# Patient Record
Sex: Male | Born: 1955 | Race: Black or African American | Hispanic: No | Marital: Married | Smoking: Never smoker
Health system: Southern US, Community
[De-identification: ages and names within clinical notes are randomized; demographics above are authoritative.]

## PROBLEM LIST (undated history)

## (undated) HISTORY — PX: APPENDECTOMY: SHX54

---

## 2003-07-22 ENCOUNTER — Inpatient Hospital Stay (HOSPITAL_COMMUNITY): Admission: EM | Admit: 2003-07-22 | Discharge: 2003-08-04 | Payer: Self-pay | Admitting: Emergency Medicine

## 2003-07-22 ENCOUNTER — Encounter: Admission: RE | Admit: 2003-07-22 | Discharge: 2003-08-04 | Payer: Self-pay

## 2003-07-22 ENCOUNTER — Ambulatory Visit: Admission: RE | Admit: 2003-07-22 | Discharge: 2003-07-22 | Payer: Self-pay | Admitting: Family Medicine

## 2005-02-09 IMAGING — CR DG ABDOMEN 2V
3 series · 3 of 3 positions shown · non-contrast
Comparison: 07/28/03.

CLINICAL DATA: ruptured appendicitis; status post percutaneous drainage of pelvic abscesses; increased dilated bowel loops seen on recent CT 
 TWO VIEW ABDOMEN

[view not recorded (1 of 3)]
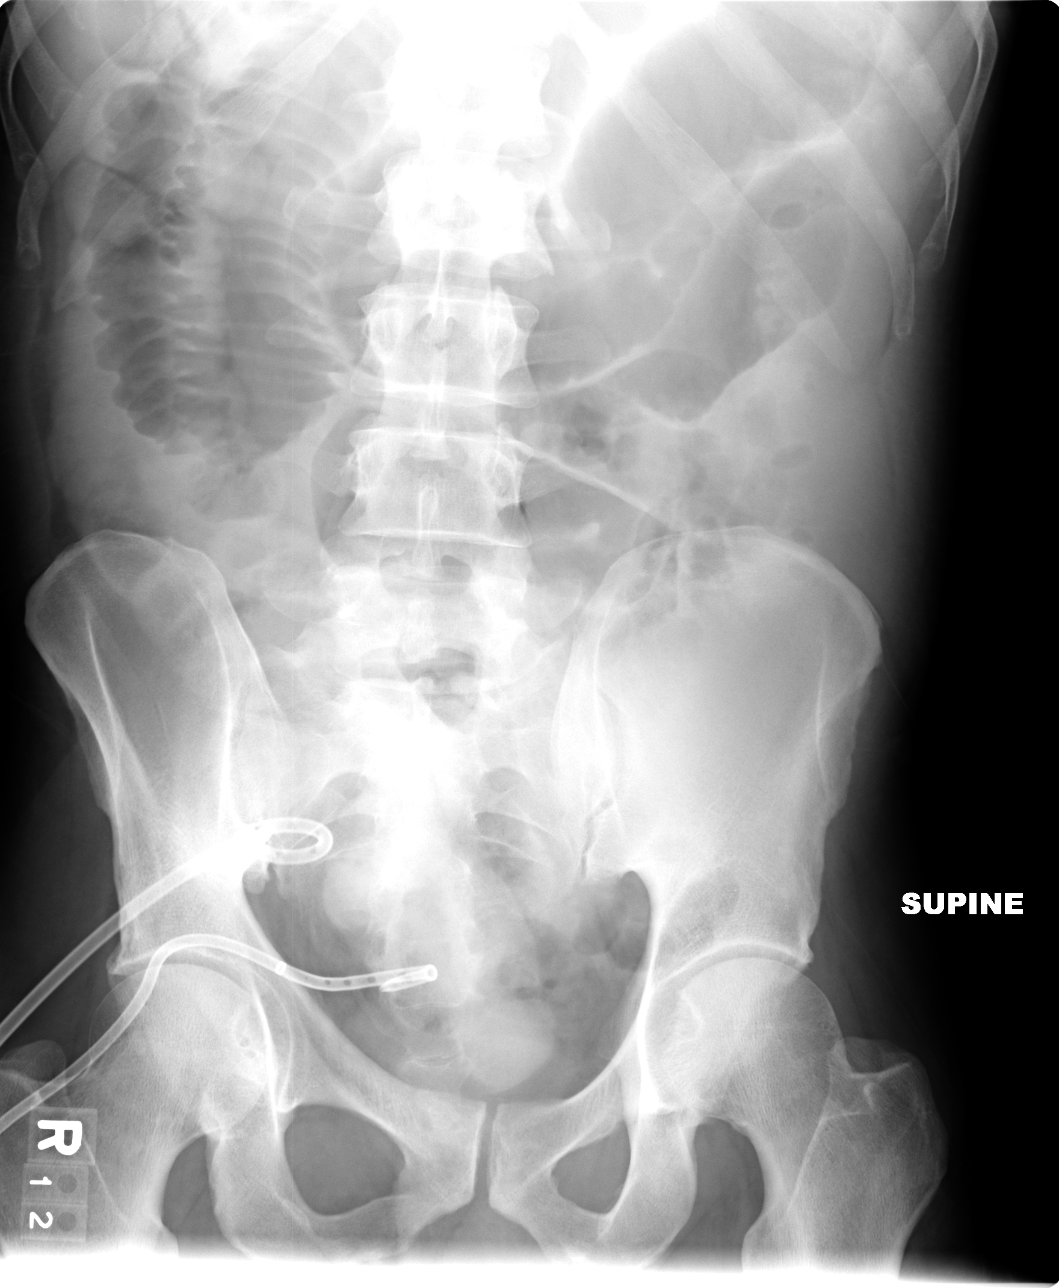

[view not recorded (2 of 3)]
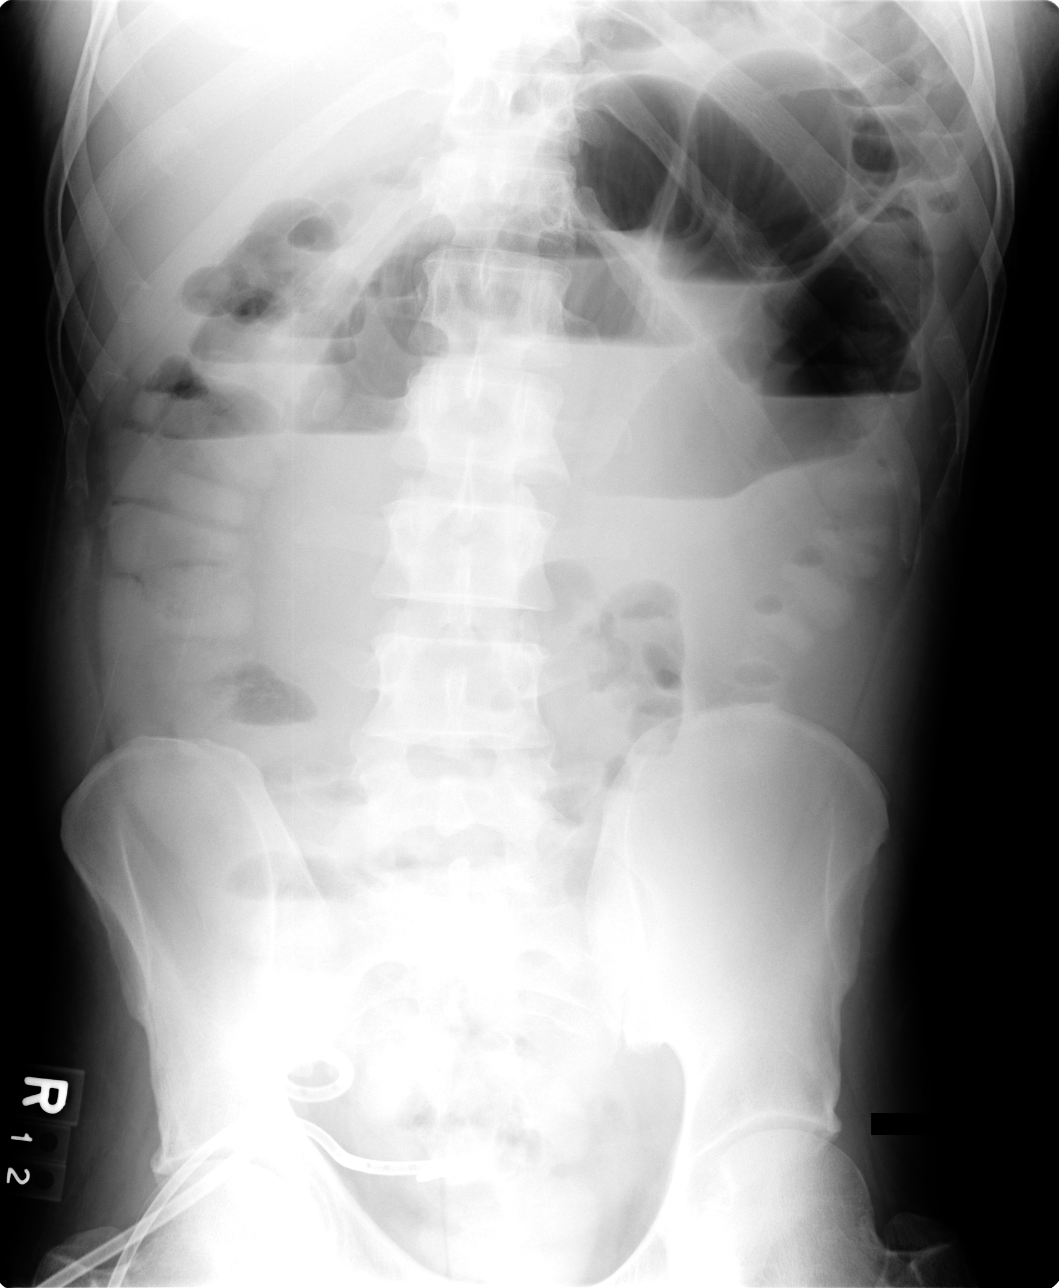

[view not recorded (3 of 3)]
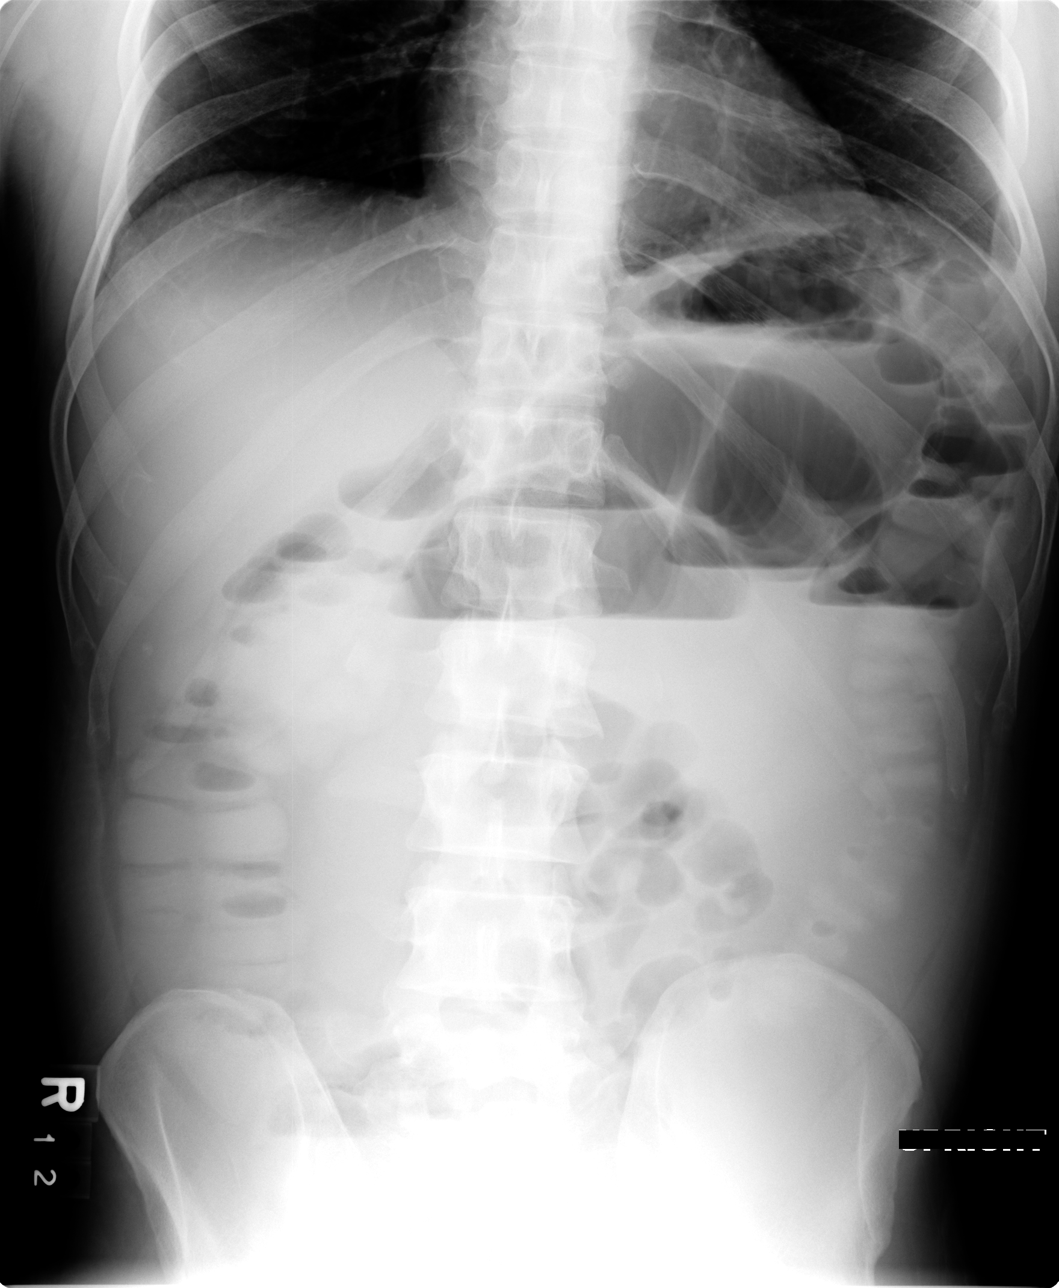

[3 of 3 positions shown; findings below may reference images not displayed]

Two percutaneous drainage catheters are seen in the right pelvis.  Dilated small bowel loops are seen within the upper abdomen which are increased.  There is scattered colonic gas and contrast from recent CT seen throughout the colon.  This may be due to a partial small bowel obstruction or ileus.  Multiple small bowel air-fluid levels are seen as well as some colonic air-fluid levels on the erect view.  There is no evidence of free intraperitoneal air.  
 IMPRESSION
 Mild increase in dilated small bowel loops since prior study, although contrast and gas is seen scattered throughout the colon.  This is suspicious for a partial small bowel obstruction or possibly a small bowel ileus.

## 2006-04-18 ENCOUNTER — Ambulatory Visit (HOSPITAL_COMMUNITY): Admission: RE | Admit: 2006-04-18 | Discharge: 2006-04-18 | Payer: Self-pay | Admitting: Family Medicine

## 2007-10-26 IMAGING — CT CT CHEST W/ CM
2 of 3 series · 15 of 36 positions shown, 18 images · IV contrast (omnipaque)
Comparison: None available.

CLINICAL DATA: 50-year-old with pulmonary nodule.  
 CHEST CT WITH CONTRAST:
TECHNIQUE: Multidetector CT imaging of the chest was performed following the standard protocol during bolus administration of intravenous contrast.
 Contrast:  80 cc Omnipaque 300.

[Series 2: chest routine 5.0 b40f · axial · 0.60mm/px · z∈[-136,+150]mm · 12 of 67 slices shown, 15 images]
[im 5/67  mediastinal]
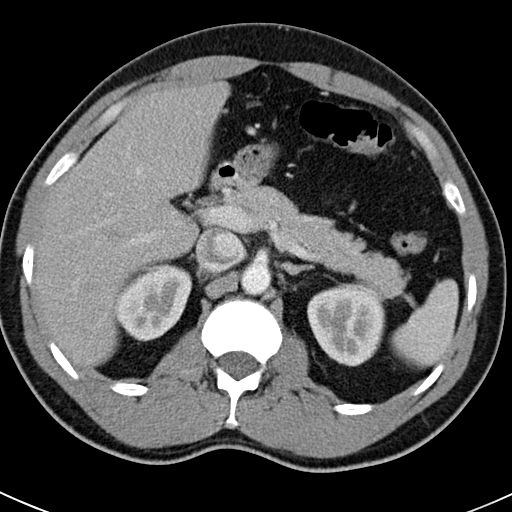
[im 5/67  lung]
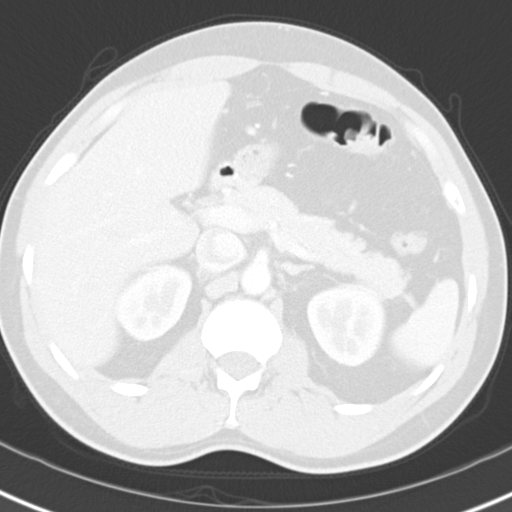
[im 10/67  lung]
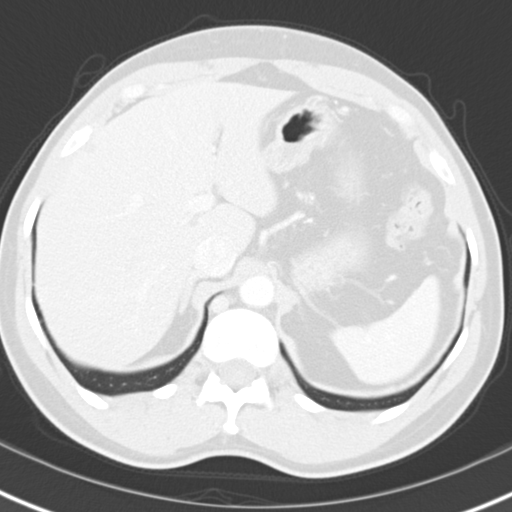
[im 15/67  lung]
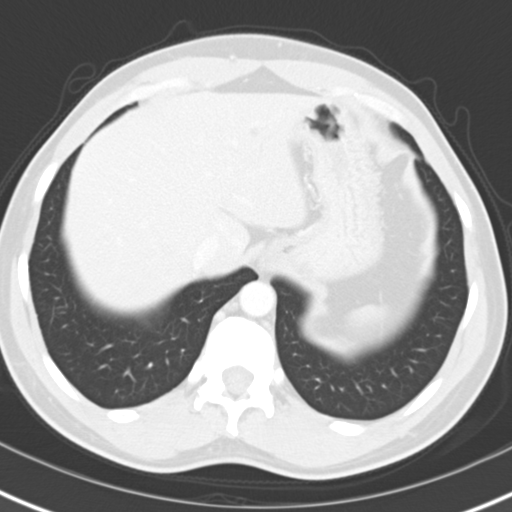
[im 20/67  lung]
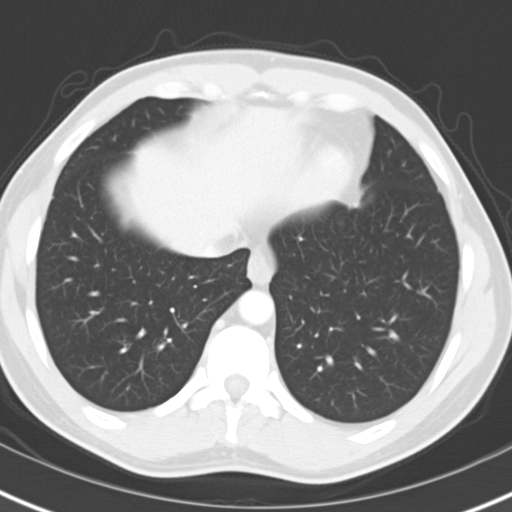
[im 25/67  mediastinal]
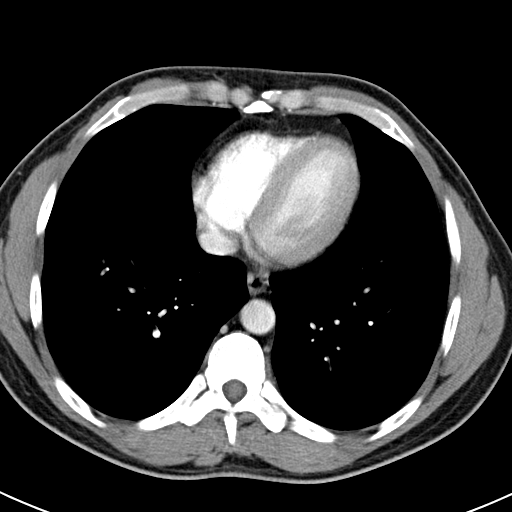
[im 25/67  lung]
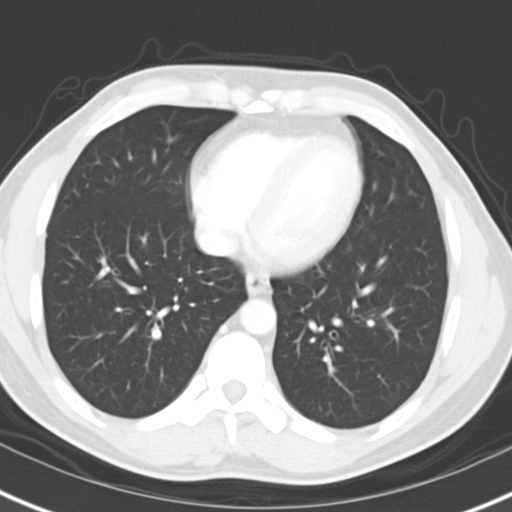
[im 30/67  lung]
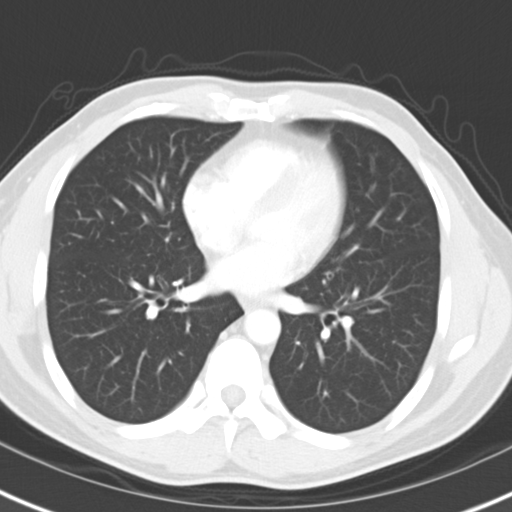
[im 37/67  lung]
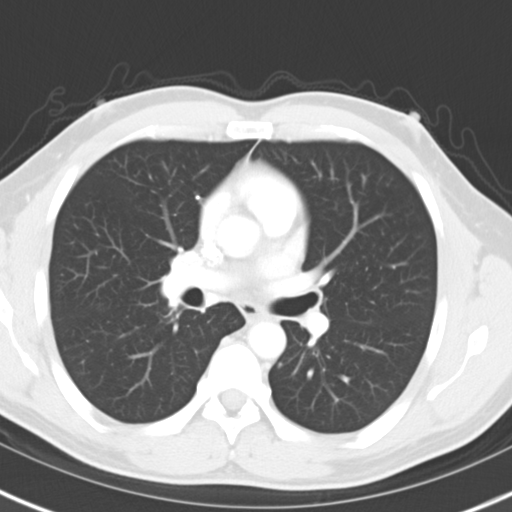
[im 42/67  lung]
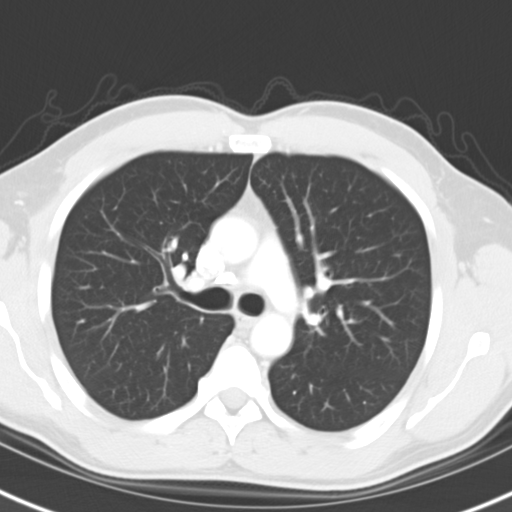
[im 47/67  mediastinal]
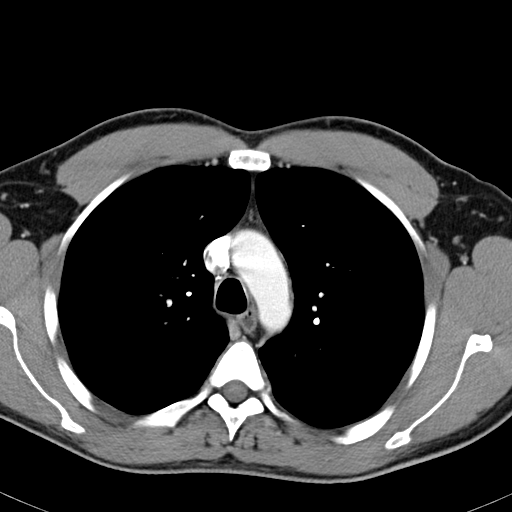
[im 47/67  lung]
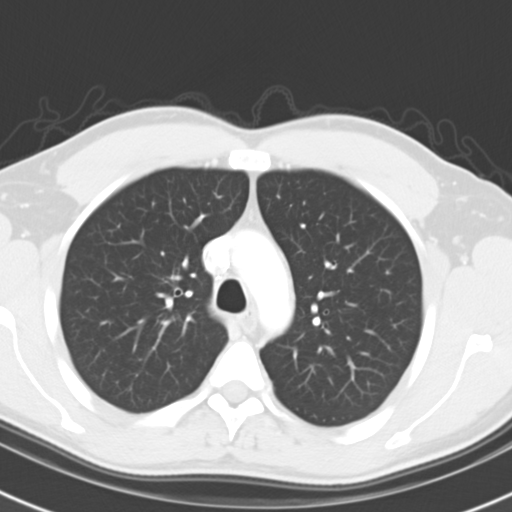
[im 52/67  lung]
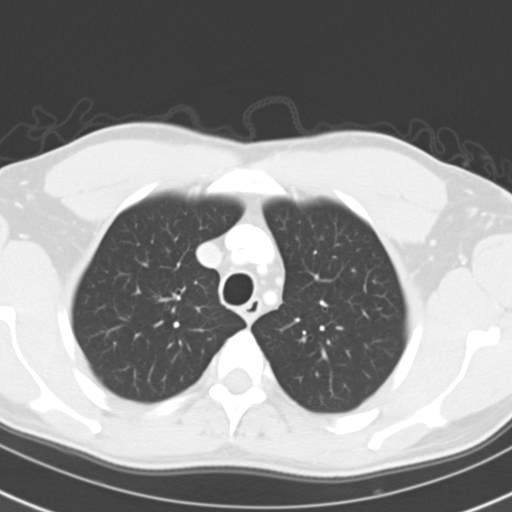
[im 57/67  lung]
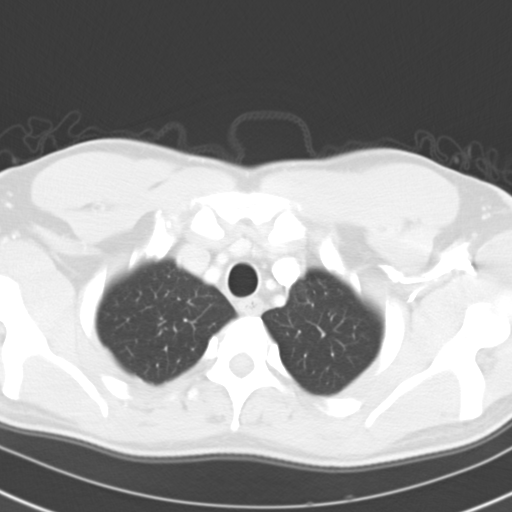
[im 62/67  lung]
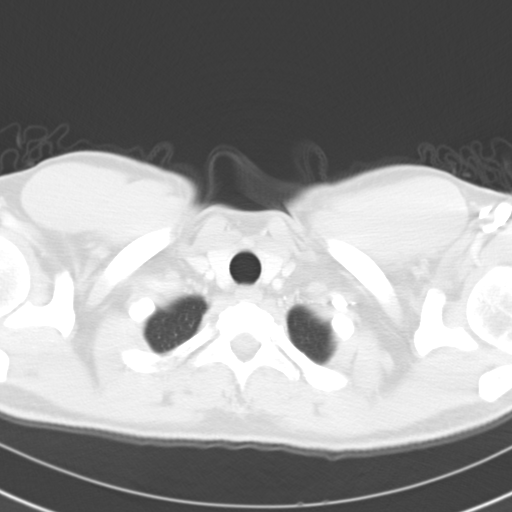

[Series 602: <mpr range> · coronal · 0.65mm/px · 3 of 99 slices shown]
[im 20/99  lung]
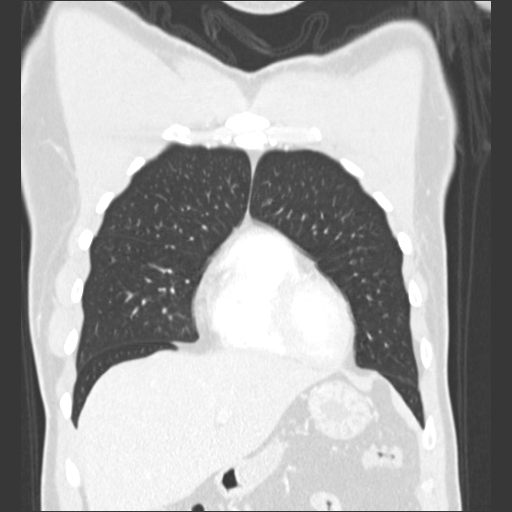
[im 40/99  lung]
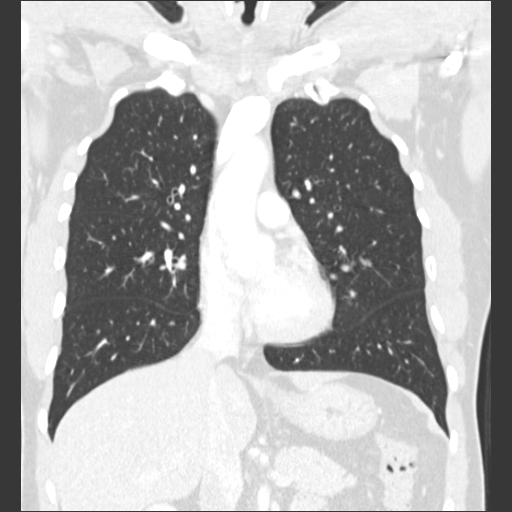
[im 59/99  lung]
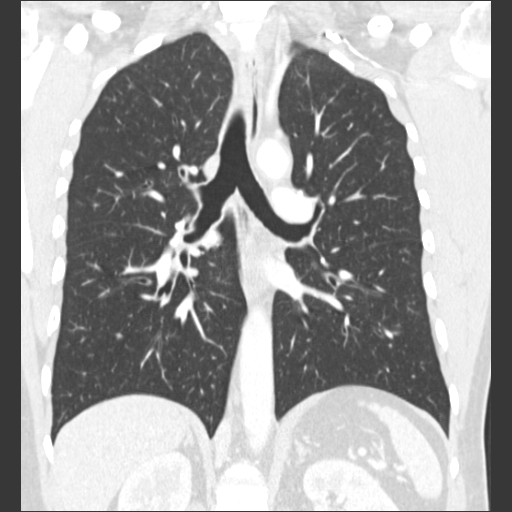

[15 of 36 positions shown; findings below may reference images not displayed]

FINDINGS: The chest wall, soft tissues and bony structures are unremarkable.  No supraclavicular or axillary adenopathy.  No significant bony findings.  No spinal canal compromise.  
 Heart is normal in size.  No pericardial effusion.  No mediastinal or hilar adenopathy.  There are calcified mediastinal and hilar lymph nodes likely secondary to remote granulomatous disease.  The aorta is normal in caliber.  No dissection.  Pulmonary arteries are grossly normal.  Esophagus is unremarkable.  
 Examination of the lung parenchyma demonstrates no worrisome pulmonary nodules or masses.  There are two small, calcified granulomata.  One is in the right middle lobe on image number 31 and the other is in the left lower lobe on image number 48.  No acute pulmonary findings.  No pleural effusions or pleural thickening.  
 The upper abdomen demonstrates small low attenuation liver lesions.  These are too small to accurate characterize but are most likely benign hepatic cysts.  No upper abdominal adenopathy or masses.
IMPRESSION: 1.  Two small, calcified granulomata in the lung along with calcified mediastinal and hilar lymph nodes likely due to remote granulomatous disease.  
 2.  No acute pulmonary findings and no worrisome pulmonary masses or nodules.  
 3.  No mediastinal or hilar adenopathy.  
 4.  Normal appearance of the thoracic aorta.  
 5.  Small low attenuation lesion in the liver most likely benign hepatic cyst.

## 2010-04-18 ENCOUNTER — Encounter: Payer: Self-pay | Admitting: Family Medicine

## 2021-04-16 ENCOUNTER — Other Ambulatory Visit: Payer: Self-pay | Admitting: Urology

## 2021-04-27 ENCOUNTER — Ambulatory Visit: Payer: Managed Care, Other (non HMO)

## 2021-05-28 ENCOUNTER — Other Ambulatory Visit (HOSPITAL_COMMUNITY): Payer: Self-pay

## 2021-09-06 ENCOUNTER — Ambulatory Visit: Payer: Medicare Other | Attending: Urology | Admitting: Physical Therapy

## 2021-09-06 ENCOUNTER — Encounter: Payer: Self-pay | Admitting: Physical Therapy

## 2021-09-06 DIAGNOSIS — M25652 Stiffness of left hip, not elsewhere classified: Secondary | ICD-10-CM | POA: Diagnosis not present

## 2021-09-06 DIAGNOSIS — R293 Abnormal posture: Secondary | ICD-10-CM | POA: Insufficient documentation

## 2021-09-06 DIAGNOSIS — M25651 Stiffness of right hip, not elsewhere classified: Secondary | ICD-10-CM | POA: Diagnosis not present

## 2021-09-06 NOTE — Therapy (Signed)
OUTPATIENT PHYSICAL THERAPY MALE PELVIC EVALUATION   Patient Name: Ivan Lopez MRN: [id number] DOB:[birth year], 66 y.o., male Today's Date: 09/06/2021   PT End of Session - 09/06/21 1128     Visit Number 1    Date for PT Re-Evaluation 11/29/21    Authorization Type [employer]    PT Start Time 1110    PT Stop Time 1200    PT Time Calculation (min) 50 min    Activity Tolerance Patient tolerated treatment well    Behavior During Therapy [facility] for tasks assessed/performed             History reviewed. No pertinent past medical history. History reviewed. No pertinent surgical history. There are no problems to display for this patient.   PCP: None per patient  REFERRING PROVIDER: Raynelle Bring, MD  REFERRING DIAG: [place] (ICD-10-CM) - Malignant neoplasm of prostate  THERAPY DIAG:  Abnormal posture  Stiffness of left hip, not elsewhere classified  Stiffness of right hip, not elsewhere classified  Rationale for Evaluation and Treatment Rehabilitation  ONSET DATE: 2016  SUBJECTIVE:                                                                                                                                                                                           SUBJECTIVE STATEMENT: Pt having surgery 10/04/21.  Wife is present throughout evaluation. Fluid intake: coffee 2-3 cups, water 1 glass Patient confirms identification and approves PT to assess pelvic floor and treatment deferred today   PAIN:  Are you having pain? No  PRECAUTIONS: None  WEIGHT BEARING RESTRICTIONS No  FALLS:  Has patient fallen in last 6 months? No  LIVING ENVIRONMENT: Lives with: lives with their family and lives with their spouse Lives in: House/apartment   OCCUPATION: no  PLOF: Independent  PATIENT GOALS knowledge how to strengthen after surgery  PERTINENT HISTORY:  Prostate cancer, appendix, testicle removed Sexual abuse: No  BOWEL MOVEMENT Pain with bowel movement: No Type  of bowel movement:Frequency every other day and Strain No Fully empty rectum: Yes:   Leakage: No Pads: No Fiber supplement: No  URINATION Pain with urination: No Fully empty bladder: Yes:   Stream: Strong and Weak mostly weak Urgency: No Frequency: every 15-30 min Leakage:  no Pads: No  INTERCOURSE Pain with intercourse:  No  Unable to get and maintain erection     OBJECTIVE:     PATIENT SURVEYS:  PFIQ (not completed today to re-assess post surgery)  COGNITION:  Overall cognitive status: Within functional limits for tasks assessed      MUSCLE LENGTH: Hamstrings: Right 90% ; Left 90%  Marcello Moores test:  Right 70% ; Left 70%  Quads tight Lt>Rt   FUNCTIONAL TESTS:  SLS normal  GAIT:  Comments: WFL  POSTURE:  Anterior pelvic tilt, decreased thoracic kyphosis  LUMBARAROM/PROM  A/PROM A/PROM  eval  Flexion 50%  Extension   Right lateral flexion   Left lateral flexion   Right rotation   Left rotation    (Blank rows = not tested)  LOWER EXTREMITY AROM/PROM:  A/PROM Right eval Left eval  Hip flexion 75% 60^  Hip extension    Hip abduction    Hip adduction    Hip internal rotation 50% 50%  Hip external rotation 30% 60%  Knee flexion    Knee extension    Ankle dorsiflexion    Ankle plantarflexion    Ankle inversion    Ankle eversion     (Blank rows = not tested)  LOWER EXTREMITY MMT: Bil hp 5/5 abduction and adduction MMT Right eval Left eval  Hip flexion    Hip extension (knee bent) 4+/5 4+/5  Hip abduction    Hip adduction    Hip internal rotation    Hip external rotation    Knee flexion    Knee extension    Ankle dorsiflexion    Ankle plantarflexion    Ankle inversion    Ankle eversion      PALPATION: GENERAL lumbar and low thoracic paraspinals tight              External Perineal Exam palpated contract and relax and can hold 20 sec, slightly less lift of pelvic floor on the left side              Internal Pelvic Floor  deferred  TONE: Normal based on external palpation  TODAY'S TREATMENT  EVAL and initial HEP with pre-prostatectomy instructions   PATIENT EDUCATION:  Education details: pre-prostatectomy instructions; Access Code: [email or web address] Person educated: Patient and Spouse Education method: Explanation, Demonstration, Tactile cues, Verbal cues, and Handouts Education comprehension: verbalized understanding and returned demonstration   HOME EXERCISE PROGRAM: Access Code: [email or web address] URL: [email or web address] Date: 09/06/2021 Prepared by: Jari Favre  Exercises - Standing Quadriceps Stretch  - 1 x daily - 7 x weekly - 1 sets - 3 reps - 30 sec hold - Standing Hip Flexor Stretch  - 3 x daily - 7 x weekly - 1 sets - 2 reps - 30 sec hold - Supine Double Knee to Chest  - 1 x daily - 7 x weekly - 3 sets - 10 reps - Supine 90/90 Shoulder Flexion with Abdominal Bracing  - 1 x daily - 7 x weekly - 3 sets - 10 reps  ASSESSMENT:  CLINICAL IMPRESSION: Patient is a 66 y.o. male who was seen today for physical therapy evaluation and treatment for pre prostatectomy surgery.    OBJECTIVE IMPAIRMENTS decreased coordination, decreased ROM, decreased strength, hypomobility, increased muscle spasms, impaired flexibility, and postural dysfunction.   ACTIVITY LIMITATIONS toileting (will need to re-assess after surgery)  PARTICIPATION LIMITATIONS: community activity(will need to re-assess after surgery)  PERSONAL FACTORS 1 comorbidity: prostate cancer and upcoming surgery  are also affecting patient's functional outcome.   REHAB POTENTIAL: Excellent  CLINICAL DECISION MAKING: Evolving/moderate complexity  EVALUATION COMPLEXITY: Low   GOALS: Goals reviewed with patient? Yes  SHORT TERM GOALS: Target date: 10/04/2021   Ind with initial HEP Baseline: Goal status: INITIAL  2.  Understanding of post prostatectomy protocol and safety precautions Baseline:  Goal status:  INITIAL   LONG TERM GOALS: Target date: 11/29/2021  Re-assess goals post prostatectomy Baseline:  Goal status: INITIAL   PLAN: PT FREQUENCY: 1x/week  PT DURATION: 12 weeks  PLANNED INTERVENTIONS: Therapeutic exercises, Therapeutic activity, Neuromuscular re-education, Balance training, Gait training, Patient/Family education, Joint mobilization, Dry Needling, Electrical stimulation, Spinal mobilization, Cryotherapy, Moist heat, Taping, Biofeedback, Manual therapy, and Re-evaluation  PLAN FOR NEXT SESSION: review education from previous visit; log roll, kegel in functional positions and with exhaling   Camillo Flaming Lavere Stork, PT 09/06/2021, 5:30 PM

## 2021-09-06 NOTE — Patient Instructions (Signed)
Pre-Prostectomy Physical Therapy Program  Water Intake: You should be drinking 8 glasses of water to decrease the irritants in the bladder and to promote healing. Reduce the number of bladder irritants 0-1 glasses per day. Incontinence Products: Briefs-underwear that have a padded area in the front Penile Clamp- clamp you place on the penis that is cushioned to stop the flow of urine Incontinence pads that is disposable Men's acticuf- pouch that goes around penis to manage light to moderate incontinence. Strengthening Program: Gluteal squeeze-lie on your back or sit in a chair. Squeeze your buttocks for 5 seconds 5 reps. every hour. Transverse Abdominus Contraction - lay on your back, push your belly button down to the mat, hands in front and curl upward to your shoulder blade 5x 3x per day. Pelvic floor contraction Piriformis stretch- supine and hip rotation Sitting hamstring stretch  After surgery  Catheter- after surgery a catheter will stay in for 7-10 days. After the catheter is removed you are able to resume the gluteal contraction, abdominal contractions and pelvic floor contractions Bed positioning- pillows to support right hip and knee in sidely. Use ice to perineum 10 min for 3 times per day Self massage the perineum Correct sitting posture When to call MD: 1. Blood in urine 2. Increased pain in right leg at night   [facility] [address], [place], [place] [address] Phone # [phone] Fax [phone]

## 2021-09-21 ENCOUNTER — Encounter: Payer: Self-pay | Admitting: Physical Therapy

## 2021-09-21 ENCOUNTER — Ambulatory Visit: Payer: Medicare Other | Admitting: Physical Therapy

## 2021-09-21 DIAGNOSIS — M25652 Stiffness of left hip, not elsewhere classified: Secondary | ICD-10-CM

## 2021-09-21 DIAGNOSIS — R293 Abnormal posture: Secondary | ICD-10-CM | POA: Diagnosis not present

## 2021-09-21 DIAGNOSIS — M25651 Stiffness of right hip, not elsewhere classified: Secondary | ICD-10-CM

## 2021-09-24 NOTE — Patient Instructions (Addendum)
DUE TO SPACE LIMITATIONS, ONLY TWO VISITORS  (aged 65 and older) ARE ALLOWED TO COME WITH YOU AND STAY IN THE WAITING ROOM DURING YOUR PRE OP AND PROCEDURE.   **NO VISITORS ARE ALLOWED IN THE SHORT STAY AREA OR RECOVERY ROOM!!**  IF YOU WILL BE ADMITTED INTO THE HOSPITAL YOU ARE ALLOWED ONLY FOUR SUPPORT PEOPLE DURING VISITATION HOURS (7 AM -8PM)   The support person(s) must pass our screening, and use Hand sanitizing gel. Visitors GUEST BADGE MUST BE WORN VISIBLY  One adult visitor may remain with you overnight and MUST be in the room by 8 P.M.   You are not required to quarantine at this time prior to your surgery. However, you must do this: Hand Hygiene often Do NOT share personal items Notify your provider if you are in close contact with someone who has COVID or you develop fever 100.4 or greater, new onset of sneezing, cough, sore throat, shortness of breath or body aches.       Your procedure is scheduled on:  Monday July 10,2023  Report to [facility] Main Entrance.  Report to admitting at: 09:00  AM  +++++Call this number if you have any questions or problems the morning of surgery [phone]   +Clear liquid diet the day before your surgery/procedure.  You will continue the clear liquid diet until  08:15  AM DAY OF SURGERY  Clear Liquid Diet Water Black Coffee (sugar ok, NO MILK/CREAM OR CREAMERS)  Tea (sugar ok, NO MILK/CREAM OR CREAMERS) regular and decaf                             Plain Jell-O (NO RED)                                           Fruit ices (not with fruit pulp, NO RED)                                     Popsicles (NO RED)                                                                  Juice: apple, WHITE grape, WHITE cranberry Sports drinks like Gatorade (NO RED) Clear broth(vegetable,chicken,beef)              If you have questions, please contact your surgeon's office.   FOLLOW BOWEL PREP AND ANY ADDITIONAL PRE OP INSTRUCTIONS YOU  RECEIVED FROM YOUR SURGEON'S OFFICE!!!     MIRALAX - Dissolve 17 grams in 4 ounces of water AND DRINK AT [place] - USE ONE FLEET [place]    Oral Hygiene is also important to reduce your risk of infection.        Remember - BRUSH YOUR TEETH THE MORNING OF SURGERY WITH YOUR REGULAR TOOTHPASTE  Do NOT smoke after Midnight the night before surgery.  Take ONLY these medicines the morning of surgery with A SIP OF WATER:  finasteride (Proscar)                    You may not have any metal on your body including  jewelry, and body piercing  Do not wear  lotions, powders, cologne, or deodorant   Men may shave face and neck.  Contacts, Hearing Aids, dentures or bridgework may not be worn into surgery.   You may bring a small overnight bag with you on the day of surgery, only pack items that are not valuable .[facility] IS NOT RESPONSIBLE   FOR VALUABLES THAT ARE LOST OR STOLEN.   DO NOT [place]. PHARMACY WILL DISPENSE MEDICATIONS LISTED ON YOUR MEDICATION LIST TO YOU DURING YOUR ADMISSION [place]!    Special Instructions: Bring a copy of your healthcare power of attorney and living will documents the day of surgery, if you wish to have them scanned into your [facility] Medical Records- EPIC  Please read over the following fact sheets you were given: IF YOU HAVE QUESTIONS ABOUT YOUR PRE-OP INSTRUCTIONS, PLEASE CALL [phone]  ([place])   [place] [facility] - Preparing for Surgery Before surgery, you can play an important role.  Because skin is not sterile, your skin needs to be as free of germs as possible.  You can reduce the number of germs on your skin by washing with CHG (chlorahexidine gluconate) soap before surgery.  CHG is an antiseptic cleaner which kills germs and bonds with the skin to continue killing germs even after washing. Please DO NOT use if you have an allergy to CHG or antibacterial  soaps.  If your skin becomes reddened/irritated stop using the CHG and inform your nurse when you arrive at Short Stay. Do not shave (including legs and underarms) for at least 48 hours prior to the first CHG shower.  You may shave your face/neck.  Please follow these instructions carefully:  1.  Shower with CHG Soap the night before surgery and the  morning of surgery.  2.  If you choose to wash your hair, wash your hair first as usual with your normal  shampoo.  3.  After you shampoo, rinse your hair and body thoroughly to remove the shampoo.                             4.  Use CHG as you would any other liquid soap.  You can apply chg directly to the skin and wash.  Gently with a scrungie or clean washcloth.  5.  Apply the CHG Soap to your body ONLY FROM THE NECK DOWN.   Do not use on face/ open                           Wound or open sores. Avoid contact with eyes, ears mouth and genitals (private parts).                       Wash face,  Genitals (private parts) with your normal soap.             6.  Wash thoroughly, paying special attention to the area where your  surgery  will be performed.  7.  Thoroughly rinse your body with warm water from the neck down.  8.  DO NOT shower/wash with your normal soap after using and rinsing off the CHG Soap.  9.  Pat yourself dry with a clean towel.            10.  Wear clean pajamas.            11.  Place clean sheets on your bed the night of your first shower and do not  sleep with pets.  ON THE DAY OF SURGERY : Do not apply any lotions/deodorants the morning of surgery.  Please wear clean clothes to the hospital/surgery center.    FAILURE TO FOLLOW THESE INSTRUCTIONS MAY RESULT IN THE CANCELLATION OF YOUR SURGERY  PATIENT SIGNATURE_________________________________  NURSE SIGNATURE__________________________________  ________________________________________________________________________

## 2021-09-24 NOTE — Progress Notes (Signed)
COVID Vaccine received:  '[]'$  No '[x]'$  Yes  Date of any COVID positive Test in last 90 days:  None  PCP - Dr. Nelda Bucks Cardiologist - none  Chest x-ray - none EKG -  09-24-21 at PST Stress Test - None ECHO - None Cardiac Cath - None  Pacemaker/ICD device last checked: NONE Spinal Cord Stimulator:'[x]'$  No '[]'$  Yes   Other Implants:   Bowel Prep - Clear liquids day prior, Miralax and Fleet Enema as per Dr. Lynne Logan instruction  History of Sleep Apnea? '[x]'$  No '[]'$  Yes  '[]'$  unknown Sleep Study Date:  none  Does the patient monitor blood sugar? '[x]'$  No '[]'$  Yes  '[x]'$  N/A     No History of diabetes  Blood Thinner Instructions:  none Aspirin Instructions: Last Dose:  Activity level:  Can go up a flight of stairs and perform activities of daily living without stopping and without symptoms of chest pain or shortness of breath.'[]'$  No '[x]'$  Yes  '[]'$  N/A  Able to do exercises without symptoms '[]'$  No '[x]'$  Yes  '[]'$  N/A  Patient able to complete ADLs without assistance '[]'$  No '[x]'$  Yes  '[]'$  N/A    Comments: PATIENT HAS SIGNED A BLOOD REFUSAL FORM ! Patient's wife is Jehovah's Witness and patient does not want any blood products.   Anesthesia review:    Patient denies shortness of breath, fever, cough and chest pain at PAT appointment  Patient verbalized understanding and agreement to the Pre-Surgical Instructions that were given to them at this PAT appointment. Patient was also educated of the need to review these PAT instructions again prior to his/her surgery.I reviewed the appropriate phone numbers to call if they have any and questions or concerns.

## 2021-09-27 ENCOUNTER — Encounter (HOSPITAL_COMMUNITY): Payer: Self-pay

## 2021-09-27 ENCOUNTER — Other Ambulatory Visit: Payer: Self-pay

## 2021-09-27 ENCOUNTER — Encounter (HOSPITAL_COMMUNITY)
Admission: RE | Admit: 2021-09-27 | Discharge: 2021-09-27 | Disposition: A | Discharge status: Home / Self Care | Payer: Medicare Other | Source: Ambulatory Visit | Attending: Urology | Admitting: Urology

## 2021-09-27 VITALS — BP 139/86 | HR 87 | Temp 98.7°F | Resp 12 | Ht 71.0 in | Wt 158.0 lb

## 2021-09-27 DIAGNOSIS — Z01818 Encounter for other preprocedural examination: Secondary | ICD-10-CM | POA: Diagnosis not present

## 2021-09-27 DIAGNOSIS — Z531 Procedure and treatment not carried out because of patient's decision for reasons of belief and group pressure: Secondary | ICD-10-CM | POA: Diagnosis not present

## 2021-09-27 LAB — BASIC METABOLIC PANEL
Anion gap: 7 (ref 5–15)
BUN: 21 mg/dL (ref 8–23)
CO2: 20 mmol/L — ABNORMAL LOW (ref 22–32)
Calcium: 8.8 mg/dL — ABNORMAL LOW (ref 8.9–10.3)
Chloride: 111 mmol/L (ref 98–111)
Creatinine, Ser: 1.05 mg/dL (ref 0.61–1.24)
GFR, Estimated: >60 mL/min (ref >60)
Glucose, Bld: 98 mg/dL (ref 70–99)
Potassium: 3.7 mmol/L (ref 3.5–5.1)
Sodium: 138 mmol/L (ref 135–145)

## 2021-09-27 LAB — CBC
HCT: 42.3 % (ref 39.0–52.0)
Hemoglobin: 13.9 g/dL (ref 13.0–17.0)
MCH: 28.9 pg (ref 26.0–34.0)
MCHC: 32.9 g/dL (ref 30.0–36.0)
MCV: 87.9 fL (ref 80.0–100.0)
Platelets: 304 10*3/uL (ref 150–400)
RBC: 4.81 MIL/uL (ref 4.22–5.81)
RDW: 14 % (ref 11.5–15.5)
WBC: 6.5 10*3/uL (ref 4.0–10.5)
nRBC: 0 % (ref 0.0–0.2)

## 2021-09-27 NOTE — Progress Notes (Addendum)
PATIENT HAS SIGNED A BLOOD REFUSAL FORM ! Patient's wife is Jehovah's Witness and patient does not want any blood products. I have put this in his FYI section of Epic and also in the Special needs section of the surgery posting.   With this refusal documented, I have cancelled the Type and Screen ordered for PST workup.   Leota Jacobsen, BSN, CVRN-BC   Pre-Surgical Testing Nurse [place]  [phone]

## 2021-09-28 LAB — NO BLOOD PRODUCTS

## 2021-10-01 ENCOUNTER — Ambulatory Visit: Payer: Medicare Other | Attending: Urology | Admitting: Physical Therapy

## 2021-10-01 ENCOUNTER — Encounter: Payer: Self-pay | Admitting: Physical Therapy

## 2021-10-01 DIAGNOSIS — M25651 Stiffness of right hip, not elsewhere classified: Secondary | ICD-10-CM | POA: Diagnosis not present

## 2021-10-01 DIAGNOSIS — R293 Abnormal posture: Secondary | ICD-10-CM

## 2021-10-01 DIAGNOSIS — M25652 Stiffness of left hip, not elsewhere classified: Secondary | ICD-10-CM | POA: Diagnosis not present

## 2021-10-01 NOTE — Therapy (Addendum)
OUTPATIENT PHYSICAL THERAPY TREATMENT NOTE   Patient Name: Ivan Lopez MRN: [id number] DOB:09/09/1955, 66 y.o., male Today's Date: 10/01/2021  PCP: None per patient   REFERRING PROVIDER: Borden, Lester, MD  END OF SESSION:   PT End of Session - 10/01/21 1106     Visit Number 3    Date for PT Re-Evaluation 11/29/21    Authorization Type [employer]    PT Start Time 1103    PT Stop Time 1143    PT Time Calculation (min) 40 min    Activity Tolerance Patient tolerated treatment well    Behavior During Therapy WFL for tasks assessed/performed              History reviewed. No pertinent past medical history. Past Surgical History:  Procedure Laterality Date   APPENDECTOMY     There are no problems to display for this patient.   REFERRING DIAG: C61 (ICD-10-CM) - Malignant neoplasm of prostate  THERAPY DIAG:  Abnormal posture  Stiffness of left hip, not elsewhere classified  Stiffness of right hip, not elsewhere classified  Rationale for Evaluation and Treatment Rehabilitation  PERTINENT HISTORY: Prostate cancer, appendix, testicle removed  PRECAUTIONS: None  SUBJECTIVE: I have been doing the exercises you gave me the first day.  I looked over post surgery exercises from last time  PAIN:  Are you having pain? No   OBJECTIVE: (objective measures completed at initial evaluation unless otherwise dated)   PATIENT SURVEYS:  PFIQ (not completed today to re-assess post surgery)   COGNITION:            Overall cognitive status: Within functional limits for tasks assessed                            MUSCLE LENGTH: Hamstrings: Right 90% ; Left 90%  Thomas test: Right 70% ; Left 70%  Quads tight Lt>Rt     FUNCTIONAL TESTS:  SLS normal   GAIT:   Comments: WFL   POSTURE:  Anterior pelvic tilt, decreased thoracic kyphosis   LUMBARAROM/PROM   A/PROM A/PROM  eval  Flexion 50%  Extension    Right lateral flexion    Left lateral flexion    Right rotation     Left rotation     (Blank rows = not tested)   LOWER EXTREMITY AROM/PROM:   A/PROM Right eval Left eval  Hip flexion 75% 60^  Hip extension      Hip abduction      Hip adduction      Hip internal rotation 50% 50%  Hip external rotation 30% 60%  Knee flexion      Knee extension      Ankle dorsiflexion      Ankle plantarflexion      Ankle inversion      Ankle eversion       (Blank rows = not tested)   LOWER EXTREMITY MMT: Bil hp 5/5 abduction and adduction MMT Right eval Left eval  Hip flexion      Hip extension (knee bent) 4+/5 4+/5  Hip abduction      Hip adduction      Hip internal rotation      Hip external rotation      Knee flexion      Knee extension      Ankle dorsiflexion      Ankle plantarflexion      Ankle inversion      Ankle eversion            PALPATION: GENERAL lumbar and low thoracic paraspinals tight               External Perineal Exam palpated contract and relax and can hold 20 sec, slightly less lift of pelvic floor on the left side               Internal Pelvic Floor deferred   TONE: Normal based on external palpation   TODAY'S TREATMENT  Treatment:10/01/21 Exercises  Nustep L4 x 5 min Straight leg raise - 10x each side Bridge with band Half kneel rotation with weighted ball Sit to stand kegel Knee and hip flexion on step H/s, TFL, hip flexor stretches Educated on walking and updates to [employer] Re-ed Education and cues for coordination of breathing and pelvic floor muscle contracting and relaxing at appropriate times  Treatment:09/21/21 Exercises  Clam with red band Bent knee fall out [employer] bridge Kegel prone and supine H/s, butterfly, piriformis stretches  [employer] Re-[employer] Education and cues for coordination of breathing and pelvic floor muscle contracting and relaxing at appropriate times  Therapeutic activities  log roll technique with cue to exhale when sitting up PATIENT EDUCATION:  Education details:  pre-prostatectomy instructions; Access Code: [email or web address] Person educated: Patient and Spouse Education method: Explanation, Demonstration, Tactile cues, Verbal cues, and Handouts Education comprehension: verbalized understanding and returned demonstration     HOME EXERCISE PROGRAM: Access Code: [email or web address] URL: [email or web address] Date: 09/21/2021 Prepared by: Jari Favre  Exercises - Standing Quadriceps Stretch  - 1 x daily - 7 x weekly - 1 sets - 3 reps - 30 sec hold - Standing Hip Flexor Stretch  - 3 x daily - 7 x weekly - 1 sets - 2 reps - 30 sec hold - Supine Double Knee to Chest  - 1 x daily - 7 x weekly - 1 sets - 3 reps - Supine 90/90 Shoulder Flexion with Abdominal Bracing  - 1 x daily - 7 x weekly - 3 sets - 10 reps - Supine Butterfly Groin Stretch  - 1 x daily - 7 x weekly - 1 sets - 3 reps - Supine Piriformis Stretch with Foot on Ground  - 1 x daily - 7 x weekly - 1 sets - 3 reps - 30 sec hold - Supine Hamstring Stretch  - 1 x daily - 7 x weekly - 1 sets - 3 reps - 30 sec hold - Supine Gluteal Sets  - 1 x daily - 7 x weekly - 3 sets - 10 reps - Supine Bridge with Gluteal Set and Spinal Articulation  - 1 x daily - 7 x weekly - 3 sets - 10 reps - Ball squeeze with Kegel  - 3 x daily - 7 x weekly - 1 sets - 10 reps - 3 sec hold - Prone Pelvic Floor Contraction with Heel Squeeze  - 1 x daily - 7 x weekly - 3 sets - 10 reps   ASSESSMENT:   CLINICAL IMPRESSION:  Pt has knowledge of exercises for improved pelvic floor health after his surgery.  He will benefit from skilled PT to re-assess post prostatectomy as needed.     OBJECTIVE IMPAIRMENTS decreased coordination, decreased ROM, decreased strength, hypomobility, increased muscle spasms, impaired flexibility, and postural dysfunction.    ACTIVITY LIMITATIONS toileting (will need to re-assess after surgery)   PARTICIPATION LIMITATIONS: community activity(will need to re-assess after surgery)   PERSONAL  FACTORS 1 comorbidity: prostate cancer and upcoming surgery  are also affecting patient's functional outcome.  REHAB POTENTIAL: Excellent   CLINICAL DECISION MAKING: Evolving/moderate complexity   EVALUATION COMPLEXITY: Low     GOALS: Goals reviewed with patient? Yes   SHORT TERM GOALS: Target date: 10/04/2021    Ind with initial HEP Baseline: Goal status: Met   2.  Understanding of post prostatectomy protocol and safety precautions Baseline:  Goal status: Met     LONG TERM GOALS: Target date: 11/29/2021    Re-assess goals post prostatectomy Baseline:  Goal status: Ongoing     PLAN: PT FREQUENCY: 1x/week   PT DURATION: 12 weeks   PLANNED INTERVENTIONS: Therapeutic exercises, Therapeutic activity, Neuromuscular re-education, Balance training, Gait training, Patient/Family education, Joint mobilization, Dry Needling, Electrical stimulation, Spinal mobilization, Cryotherapy, Moist heat, Taping, Biofeedback, Manual therapy, and Re-evaluation   PLAN FOR NEXT SESSION: re-assess post surgery as needed     Jule Ser, PT 10/01/2021, 11:08 AM   PHYSICAL THERAPY DISCHARGE SUMMARY  Visits from Start of Care: 3  Current functional level related to goals / functional outcomes: See above goals   Remaining deficits: See above details   Education / Equipment: HEP   Patient agrees to discharge. Patient goals were mostly met. Patient is being discharged due to not returning since the last visit.  Gustavus Bryant, PT 01/07/22 9:33 AM

## 2021-10-04 ENCOUNTER — Encounter (HOSPITAL_COMMUNITY): Payer: Self-pay | Admitting: Urology

## 2021-10-04 ENCOUNTER — Inpatient Hospital Stay (HOSPITAL_COMMUNITY)
Admission: RE | Admit: 2021-10-04 | Discharge: 2021-11-06 | DRG: 707 | Disposition: A | Discharge status: Home / Self Care | Payer: Medicare Other | Attending: Surgery | Admitting: Surgery

## 2021-10-04 ENCOUNTER — Encounter (HOSPITAL_COMMUNITY): Admission: RE | Disposition: A | Discharge status: Home / Self Care | Payer: Self-pay | Source: Home / Self Care

## 2021-10-04 ENCOUNTER — Other Ambulatory Visit: Payer: Self-pay

## 2021-10-04 ENCOUNTER — Ambulatory Visit (HOSPITAL_COMMUNITY): Payer: Medicare Other | Admitting: Anesthesiology

## 2021-10-04 DIAGNOSIS — T8143XA Infection following a procedure, organ and space surgical site, initial encounter: Secondary | ICD-10-CM | POA: Diagnosis not present

## 2021-10-04 DIAGNOSIS — B9689 Other specified bacterial agents as the cause of diseases classified elsewhere: Secondary | ICD-10-CM | POA: Diagnosis not present

## 2021-10-04 DIAGNOSIS — J9811 Atelectasis: Secondary | ICD-10-CM | POA: Diagnosis not present

## 2021-10-04 DIAGNOSIS — C61 Malignant neoplasm of prostate: Secondary | ICD-10-CM | POA: Diagnosis not present

## 2021-10-04 DIAGNOSIS — R066 Hiccough: Secondary | ICD-10-CM | POA: Diagnosis not present

## 2021-10-04 DIAGNOSIS — K9131 Postprocedural partial intestinal obstruction: Secondary | ICD-10-CM | POA: Diagnosis not present

## 2021-10-04 DIAGNOSIS — K9189 Other postprocedural complications and disorders of digestive system: Secondary | ICD-10-CM | POA: Diagnosis not present

## 2021-10-04 DIAGNOSIS — K6389 Other specified diseases of intestine: Secondary | ICD-10-CM | POA: Diagnosis present

## 2021-10-04 DIAGNOSIS — K529 Noninfective gastroenteritis and colitis, unspecified: Secondary | ICD-10-CM | POA: Diagnosis not present

## 2021-10-04 DIAGNOSIS — K651 Peritoneal abscess: Secondary | ICD-10-CM | POA: Diagnosis not present

## 2021-10-04 DIAGNOSIS — S36898A Other injury of other intra-abdominal organs, initial encounter: Secondary | ICD-10-CM | POA: Diagnosis not present

## 2021-10-04 DIAGNOSIS — S36438A Laceration of other part of small intestine, initial encounter: Secondary | ICD-10-CM | POA: Diagnosis not present

## 2021-10-04 DIAGNOSIS — Z79899 Other long term (current) drug therapy: Secondary | ICD-10-CM

## 2021-10-04 DIAGNOSIS — R339 Retention of urine, unspecified: Secondary | ICD-10-CM | POA: Diagnosis not present

## 2021-10-04 DIAGNOSIS — S36498A Other injury of other part of small intestine, initial encounter: Secondary | ICD-10-CM | POA: Diagnosis present

## 2021-10-04 DIAGNOSIS — K56609 Unspecified intestinal obstruction, unspecified as to partial versus complete obstruction: Secondary | ICD-10-CM

## 2021-10-04 DIAGNOSIS — K660 Peritoneal adhesions (postprocedural) (postinfection): Secondary | ICD-10-CM | POA: Diagnosis not present

## 2021-10-04 DIAGNOSIS — Z9049 Acquired absence of other specified parts of digestive tract: Secondary | ICD-10-CM

## 2021-10-04 DIAGNOSIS — K55021 Focal (segmental) acute infarction of small intestine: Secondary | ICD-10-CM | POA: Diagnosis not present

## 2021-10-04 DIAGNOSIS — Y838 Other surgical procedures as the cause of abnormal reaction of the patient, or of later complication, without mention of misadventure at the time of the procedure: Secondary | ICD-10-CM | POA: Diagnosis present

## 2021-10-04 DIAGNOSIS — S36590A Other injury of ascending [right] colon, initial encounter: Secondary | ICD-10-CM | POA: Diagnosis not present

## 2021-10-04 DIAGNOSIS — E7800 Pure hypercholesterolemia, unspecified: Secondary | ICD-10-CM | POA: Diagnosis present

## 2021-10-04 DIAGNOSIS — N529 Male erectile dysfunction, unspecified: Secondary | ICD-10-CM | POA: Diagnosis present

## 2021-10-04 HISTORY — PX: LAPAROSCOPIC LYSIS OF ADHESIONS: SHX5905

## 2021-10-04 HISTORY — PX: ROBOT ASSISTED LAPAROSCOPIC RADICAL PROSTATECTOMY: SHX5141

## 2021-10-04 HISTORY — PX: LYMPHADENECTOMY: SHX5960

## 2021-10-04 LAB — HEMOGLOBIN AND HEMATOCRIT, BLOOD
HCT: 38.8 % — ABNORMAL LOW (ref 39.0–52.0)
Hemoglobin: 12.7 g/dL — ABNORMAL LOW (ref 13.0–17.0)

## 2021-10-04 SURGERY — XI ROBOTIC ASSISTED LAPAROSCOPIC RADICAL PROSTATECTOMY LEVEL 3
Anesthesia: General

## 2021-10-04 MED ORDER — CEFAZOLIN SODIUM 1 G IJ SOLR
INTRAMUSCULAR | Status: AC
  Filled 2021-10-04: qty 20

## 2021-10-04 MED ORDER — MIDAZOLAM HCL 5 MG/5ML IJ SOLN
INTRAMUSCULAR | Status: DC | PRN
  Administered 2021-10-04: 2 mg via INTRAVENOUS

## 2021-10-04 MED ORDER — PROPOFOL 10 MG/ML IV BOLUS
INTRAVENOUS | Status: DC | PRN
  Administered 2021-10-04: 150 mg via INTRAVENOUS

## 2021-10-04 MED ORDER — ACETAMINOPHEN 325 MG PO TABS
650.0000 mg | ORAL_TABLET | ORAL | Status: DC | PRN
  Administered 2021-10-04 – 2021-10-08 (×3): 650 mg via ORAL
  Filled 2021-10-04 (×3): qty 2

## 2021-10-04 MED ORDER — PHENYLEPHRINE HCL-NACL 20-0.9 MG/250ML-% IV SOLN
INTRAVENOUS | Status: DC | PRN
  Administered 2021-10-04: 35 ug/min via INTRAVENOUS

## 2021-10-04 MED ORDER — HYDROMORPHONE HCL 2 MG/ML IJ SOLN
INTRAMUSCULAR | Status: AC
  Filled 2021-10-04: qty 1

## 2021-10-04 MED ORDER — HEMOSTATIC AGENTS (NO CHARGE) OPTIME
TOPICAL | Status: DC | PRN
  Administered 2021-10-04: 1 via TOPICAL

## 2021-10-04 MED ORDER — TRIPLE ANTIBIOTIC 3.5-400-5000 EX OINT
1.0000 | TOPICAL_OINTMENT | Freq: Three times a day (TID) | CUTANEOUS | Status: DC | PRN
Start: 2021-10-04 — End: 2021-11-06

## 2021-10-04 MED ORDER — ROCURONIUM BROMIDE 10 MG/ML (PF) SYRINGE
PREFILLED_SYRINGE | INTRAVENOUS | Status: AC
  Filled 2021-10-04: qty 10

## 2021-10-04 MED ORDER — PROPOFOL 10 MG/ML IV BOLUS
INTRAVENOUS | Status: AC
  Filled 2021-10-04: qty 20

## 2021-10-04 MED ORDER — LACTATED RINGERS IV SOLN: INTRAVENOUS | Status: DC

## 2021-10-04 MED ORDER — DOCUSATE SODIUM 100 MG PO CAPS: 100.0000 mg | ORAL_CAPSULE | Freq: Two times a day (BID) | ORAL | Status: DC

## 2021-10-04 MED ORDER — ROCURONIUM BROMIDE 10 MG/ML (PF) SYRINGE
PREFILLED_SYRINGE | INTRAVENOUS | Status: DC | PRN
  Administered 2021-10-04: 30 mg via INTRAVENOUS
  Administered 2021-10-04: 80 mg via INTRAVENOUS
  Administered 2021-10-04 (×3): 20 mg via INTRAVENOUS

## 2021-10-04 MED ORDER — CEFAZOLIN SODIUM-DEXTROSE 2-4 GM/100ML-% IV SOLN
2.0000 g | INTRAVENOUS | Status: AC
  Administered 2021-10-04 (×2): 2 g via INTRAVENOUS
  Filled 2021-10-04: qty 100

## 2021-10-04 MED ORDER — KETAMINE HCL 50 MG/5ML IJ SOSY
PREFILLED_SYRINGE | INTRAMUSCULAR | Status: AC
  Filled 2021-10-04: qty 5

## 2021-10-04 MED ORDER — STERILE WATER FOR IRRIGATION IR SOLN
Status: DC | PRN
  Administered 2021-10-04: 1000 mL

## 2021-10-04 MED ORDER — KETAMINE HCL 10 MG/ML IJ SOLN
INTRAMUSCULAR | Status: DC | PRN
  Administered 2021-10-04: 30 mg via INTRAVENOUS
  Administered 2021-10-04: 20 mg via INTRAVENOUS

## 2021-10-04 MED ORDER — LIDOCAINE HCL (PF) 2 % IJ SOLN
INTRAMUSCULAR | Status: AC
  Filled 2021-10-04: qty 5

## 2021-10-04 MED ORDER — SODIUM CHLORIDE 0.9 % IR SOLN
Status: DC | PRN
  Administered 2021-10-04: 1000 mL via INTRAVESICAL

## 2021-10-04 MED ORDER — SUGAMMADEX SODIUM 200 MG/2ML IV SOLN
INTRAVENOUS | Status: DC | PRN
  Administered 2021-10-04: 200 mg via INTRAVENOUS

## 2021-10-04 MED ORDER — FLUORESCEIN SODIUM 10 % IV SOLN
INTRAVENOUS | Status: AC
  Filled 2021-10-04: qty 5

## 2021-10-04 MED ORDER — BUPIVACAINE-EPINEPHRINE 0.25% -1:200000 IJ SOLN
INTRAMUSCULAR | Status: DC | PRN
  Administered 2021-10-04: 30 mL

## 2021-10-04 MED ORDER — ONDANSETRON HCL 4 MG/2ML IJ SOLN: 4.0000 mg | Freq: Once | INTRAMUSCULAR | Status: DC | PRN

## 2021-10-04 MED ORDER — DEXAMETHASONE SODIUM PHOSPHATE 10 MG/ML IJ SOLN
INTRAMUSCULAR | Status: AC
  Filled 2021-10-04: qty 1

## 2021-10-04 MED ORDER — HYDROMORPHONE HCL 1 MG/ML IJ SOLN
INTRAMUSCULAR | Status: DC | PRN
  Administered 2021-10-04 (×2): .5 mg via INTRAVENOUS
  Administered 2021-10-04: 1 mg via INTRAVENOUS

## 2021-10-04 MED ORDER — DIPHENHYDRAMINE HCL 12.5 MG/5ML PO ELIX: 12.5000 mg | ORAL_SOLUTION | Freq: Four times a day (QID) | ORAL | Status: DC | PRN

## 2021-10-04 MED ORDER — DEXAMETHASONE SODIUM PHOSPHATE 10 MG/ML IJ SOLN
INTRAMUSCULAR | Status: DC | PRN
  Administered 2021-10-04: 8 mg via INTRAVENOUS

## 2021-10-04 MED ORDER — HYOSCYAMINE SULFATE 0.125 MG SL SUBL: 0.1250 mg | SUBLINGUAL_TABLET | Freq: Four times a day (QID) | SUBLINGUAL | Status: DC | PRN

## 2021-10-04 MED ORDER — CHLORHEXIDINE GLUCONATE 0.12 % MT SOLN
15.0000 mL | Freq: Once | OROMUCOSAL | Status: AC
  Administered 2021-10-04: 15 mL via OROMUCOSAL

## 2021-10-04 MED ORDER — MIDAZOLAM HCL 2 MG/2ML IJ SOLN
INTRAMUSCULAR | Status: AC
  Filled 2021-10-04: qty 2

## 2021-10-04 MED ORDER — CEFAZOLIN SODIUM-DEXTROSE 1-4 GM/50ML-% IV SOLN
1.0000 g | Freq: Three times a day (TID) | INTRAVENOUS | Status: AC
  Administered 2021-10-04 – 2021-10-05 (×2): 1 g via INTRAVENOUS
  Filled 2021-10-04 (×2): qty 50

## 2021-10-04 MED ORDER — FENTANYL CITRATE PF 50 MCG/ML IJ SOSY
PREFILLED_SYRINGE | INTRAMUSCULAR | Status: AC
  Filled 2021-10-04: qty 3

## 2021-10-04 MED ORDER — ACETAMINOPHEN 500 MG PO TABS
1000.0000 mg | ORAL_TABLET | Freq: Once | ORAL | Status: AC
Start: 2021-10-04 — End: 2021-10-04
  Administered 2021-10-04: 1000 mg via ORAL
  Filled 2021-10-04: qty 2

## 2021-10-04 MED ORDER — ONDANSETRON HCL 4 MG/2ML IJ SOLN
INTRAMUSCULAR | Status: AC
  Filled 2021-10-04: qty 2

## 2021-10-04 MED ORDER — SODIUM CHLORIDE 0.9 % IV BOLUS
1000.0000 mL | Freq: Once | INTRAVENOUS | Status: AC
  Administered 2021-10-04: 1000 mL via INTRAVENOUS

## 2021-10-04 MED ORDER — KETOROLAC TROMETHAMINE 15 MG/ML IJ SOLN
15.0000 mg | Freq: Four times a day (QID) | INTRAMUSCULAR | Status: AC
  Administered 2021-10-04 – 2021-10-06 (×6): 15 mg via INTRAVENOUS
  Filled 2021-10-04 (×6): qty 1

## 2021-10-04 MED ORDER — ORAL CARE MOUTH RINSE: 15.0000 mL | Freq: Once | OROMUCOSAL | Status: AC

## 2021-10-04 MED ORDER — FENTANYL CITRATE (PF) 250 MCG/5ML IJ SOLN
INTRAMUSCULAR | Status: DC | PRN
  Administered 2021-10-04 (×2): 50 ug via INTRAVENOUS
  Administered 2021-10-04: 100 ug via INTRAVENOUS
  Administered 2021-10-04: 50 ug via INTRAVENOUS

## 2021-10-04 MED ORDER — FENTANYL CITRATE PF 50 MCG/ML IJ SOSY
25.0000 ug | PREFILLED_SYRINGE | INTRAMUSCULAR | Status: DC | PRN
  Administered 2021-10-04 (×2): 50 ug via INTRAVENOUS

## 2021-10-04 MED ORDER — LACTATED RINGERS IV SOLN
INTRAVENOUS | Status: DC | PRN
  Administered 2021-10-04: 1000 mL

## 2021-10-04 MED ORDER — AMISULPRIDE (ANTIEMETIC) 5 MG/2ML IV SOLN: 10.0000 mg | Freq: Once | INTRAVENOUS | Status: DC | PRN

## 2021-10-04 MED ORDER — ONDANSETRON HCL 4 MG/2ML IJ SOLN
4.0000 mg | INTRAMUSCULAR | Status: DC | PRN
  Administered 2021-10-06 – 2021-10-16 (×11): 4 mg via INTRAVENOUS
  Filled 2021-10-04 (×11): qty 2

## 2021-10-04 MED ORDER — ZOLPIDEM TARTRATE 5 MG PO TABS
5.0000 mg | ORAL_TABLET | Freq: Every evening | ORAL | Status: DC | PRN
  Administered 2021-10-08 – 2021-10-10 (×3): 5 mg via ORAL
  Filled 2021-10-04 (×4): qty 1

## 2021-10-04 MED ORDER — LIDOCAINE HCL 2 % IJ SOLN
INTRAMUSCULAR | Status: AC
  Filled 2021-10-04: qty 20

## 2021-10-04 MED ORDER — DOCUSATE SODIUM 100 MG PO CAPS
100.0000 mg | ORAL_CAPSULE | Freq: Two times a day (BID) | ORAL | Status: DC
  Administered 2021-10-04 – 2021-10-06 (×5): 100 mg via ORAL
  Filled 2021-10-04 (×9): qty 1

## 2021-10-04 MED ORDER — OXYCODONE HCL 5 MG PO TABS: 5.0000 mg | ORAL_TABLET | Freq: Once | ORAL | Status: DC | PRN

## 2021-10-04 MED ORDER — LIDOCAINE HCL (CARDIAC) PF 100 MG/5ML IV SOSY
PREFILLED_SYRINGE | INTRAVENOUS | Status: DC | PRN
  Administered 2021-10-04: 60 mg via INTRAVENOUS

## 2021-10-04 MED ORDER — ATORVASTATIN CALCIUM 40 MG PO TABS
80.0000 mg | ORAL_TABLET | Freq: Every day | ORAL | Status: DC
  Administered 2021-10-04 – 2021-10-10 (×5): 80 mg via ORAL
  Filled 2021-10-04 (×5): qty 2

## 2021-10-04 MED ORDER — MORPHINE SULFATE (PF) 2 MG/ML IV SOLN: 2.0000 mg | INTRAVENOUS | Status: DC | PRN

## 2021-10-04 MED ORDER — FENTANYL CITRATE (PF) 250 MCG/5ML IJ SOLN
INTRAMUSCULAR | Status: AC
  Filled 2021-10-04: qty 5

## 2021-10-04 MED ORDER — OXYCODONE HCL 5 MG/5ML PO SOLN: 5.0000 mg | Freq: Once | ORAL | Status: DC | PRN

## 2021-10-04 MED ORDER — KCL IN DEXTROSE-NACL 20-5-0.45 MEQ/L-%-% IV SOLN
INTRAVENOUS | Status: DC
  Filled 2021-10-04 (×3): qty 1000

## 2021-10-04 MED ORDER — LIDOCAINE 2% (20 MG/ML) 5 ML SYRINGE
INTRAMUSCULAR | Status: DC | PRN
  Administered 2021-10-04: 1.5 mg/kg/h via INTRAVENOUS

## 2021-10-04 MED ORDER — HEPARIN SODIUM (PORCINE) 1000 UNIT/ML IJ SOLN
INTRAMUSCULAR | Status: AC
  Filled 2021-10-04: qty 1

## 2021-10-04 MED ORDER — TRAMADOL HCL 50 MG PO TABS: 50.0000 mg | ORAL_TABLET | Freq: Four times a day (QID) | ORAL | 0 refills | Status: DC | PRN

## 2021-10-04 MED ORDER — SULFAMETHOXAZOLE-TRIMETHOPRIM 800-160 MG PO TABS: 1.0000 | ORAL_TABLET | Freq: Two times a day (BID) | ORAL | 0 refills | Status: DC

## 2021-10-04 MED ORDER — BUPIVACAINE-EPINEPHRINE (PF) 0.25% -1:200000 IJ SOLN
INTRAMUSCULAR | Status: AC
  Filled 2021-10-04: qty 30

## 2021-10-04 MED ORDER — DIPHENHYDRAMINE HCL 50 MG/ML IJ SOLN: 12.5000 mg | Freq: Four times a day (QID) | INTRAMUSCULAR | Status: DC | PRN

## 2021-10-04 MED ORDER — ONDANSETRON HCL 4 MG/2ML IJ SOLN
INTRAMUSCULAR | Status: DC | PRN
  Administered 2021-10-04: 4 mg via INTRAVENOUS

## 2021-10-04 SURGICAL SUPPLY — 72 items
ADH SKN CLS APL DERMABOND .7 (GAUZE/BANDAGES/DRESSINGS) ×2
APL PRP STRL LF DISP 70% ISPRP (MISCELLANEOUS) ×2
APL SWBSTK 6 STRL LF DISP (MISCELLANEOUS) ×2
APPLICATOR COTTON TIP 6 STRL (MISCELLANEOUS) ×2 IMPLANT
APPLICATOR COTTON TIP 6IN STRL (MISCELLANEOUS) ×3
BAG COUNTER SPONGE SURGICOUNT (BAG) IMPLANT
BAG SPNG CNTER NS LX DISP (BAG)
CATH FOLEY 2WAY SLVR 18FR 30CC (CATHETERS) ×3 IMPLANT
CATH ROBINSON RED A/P 16FR (CATHETERS) ×3 IMPLANT
CATH ROBINSON RED A/P 8FR (CATHETERS) ×3 IMPLANT
CATH TIEMANN FOLEY 18FR 5CC (CATHETERS) ×3 IMPLANT
CHLORAPREP W/TINT 26 (MISCELLANEOUS) ×3 IMPLANT
CLIP LIGATING HEM O LOK PURPLE (MISCELLANEOUS) ×8 IMPLANT
COVER SURGICAL LIGHT HANDLE (MISCELLANEOUS) ×3 IMPLANT
COVER TIP SHEARS 8 DVNC (MISCELLANEOUS) ×2 IMPLANT
COVER TIP SHEARS 8MM DA VINCI (MISCELLANEOUS) ×3
CUTTER ECHEON FLEX ENDO 45 340 (ENDOMECHANICALS) ×3 IMPLANT
DERMABOND ADVANCED (GAUZE/BANDAGES/DRESSINGS) ×1
DERMABOND ADVANCED .7 DNX12 (GAUZE/BANDAGES/DRESSINGS) ×2 IMPLANT
DRAIN CHANNEL RND F F (WOUND CARE) IMPLANT
DRAPE ARM DVNC X/XI (DISPOSABLE) ×8 IMPLANT
DRAPE COLUMN DVNC XI (DISPOSABLE) ×2 IMPLANT
DRAPE DA VINCI XI ARM (DISPOSABLE) ×12
DRAPE DA VINCI XI COLUMN (DISPOSABLE) ×3
DRAPE SURG IRRIG POUCH 19X23 (DRAPES) ×3 IMPLANT
DRSG TEGADERM 4X4.75 (GAUZE/BANDAGES/DRESSINGS) ×3 IMPLANT
ELECT PENCIL ROCKER SW 15FT (MISCELLANEOUS) ×3 IMPLANT
ELECT REM PT RETURN 15FT ADLT (MISCELLANEOUS) ×3 IMPLANT
GAUZE 4X4 16PLY ~~LOC~~+RFID DBL (SPONGE) ×3 IMPLANT
GAUZE SPONGE 4X4 12PLY STRL (GAUZE/BANDAGES/DRESSINGS) ×3 IMPLANT
GLOVE BIO SURGEON STRL SZ 6.5 (GLOVE) ×3 IMPLANT
GLOVE SURG LX 7.5 STRW (GLOVE) ×2
GLOVE SURG LX STRL 7.5 STRW (GLOVE) ×4 IMPLANT
GOWN SRG XL LVL 4 BRTHBL STRL (GOWNS) ×2 IMPLANT
GOWN STRL NON-REIN XL LVL4 (GOWNS) ×3
GOWN STRL REUS W/ TWL XL LVL3 (GOWN DISPOSABLE) ×4 IMPLANT
GOWN STRL REUS W/TWL XL LVL3 (GOWN DISPOSABLE) ×6
HEMOSTAT SURGICEL 4X8 (HEMOSTASIS) ×1 IMPLANT
HOLDER FOLEY CATH W/STRAP (MISCELLANEOUS) ×3 IMPLANT
IRRIG SUCT STRYKERFLOW 2 WTIP (MISCELLANEOUS) ×6
IRRIGATION SUCT STRKRFLW 2 WTP (MISCELLANEOUS) ×2 IMPLANT
IV LACTATED RINGERS 1000ML (IV SOLUTION) ×3 IMPLANT
KIT TURNOVER KIT A (KITS) IMPLANT
NDL SAFETY ECLIPSE 18X1.5 (NEEDLE) ×2 IMPLANT
NEEDLE HYPO 18GX1.5 SHARP (NEEDLE) ×3
PACK ROBOT UROLOGY CUSTOM (CUSTOM PROCEDURE TRAY) ×3 IMPLANT
RELOAD STAPLE 45 4.1 GRN THCK (STAPLE) ×2 IMPLANT
SCISSORS LAP 5X45 EPIX DISP (ENDOMECHANICALS) ×1 IMPLANT
SEAL CANN UNIV 5-8 DVNC XI (MISCELLANEOUS) ×8 IMPLANT
SEAL XI 5MM-8MM UNIVERSAL (MISCELLANEOUS) ×12
SET TUBE SMOKE EVAC HIGH FLOW (TUBING) ×3 IMPLANT
SLEEVE SURGEON STRL (DRAPES) ×1 IMPLANT
SOLUTION ELECTROLUBE (MISCELLANEOUS) ×3 IMPLANT
SPIKE FLUID TRANSFER (MISCELLANEOUS) ×3 IMPLANT
STAPLE RELOAD 45 GRN (STAPLE) ×2 IMPLANT
STAPLE RELOAD 45MM GREEN (STAPLE) ×3
SUT ETHILON 3 0 PS 1 (SUTURE) ×3 IMPLANT
SUT MNCRL 3 0 RB1 (SUTURE) ×2 IMPLANT
SUT MNCRL 3 0 VIOLET RB1 (SUTURE) ×2 IMPLANT
SUT MNCRL AB 4-0 PS2 18 (SUTURE) ×6 IMPLANT
SUT MONOCRYL 3 0 RB1 (SUTURE) ×6
SUT PDS PLUS 0 (SUTURE) ×6
SUT PDS PLUS AB 0 CT-2 (SUTURE) ×4 IMPLANT
SUT VIC AB 0 CT1 27 (SUTURE) ×6
SUT VIC AB 0 CT1 27XBRD ANTBC (SUTURE) ×4 IMPLANT
SUT VIC AB 2-0 SH 27 (SUTURE) ×3
SUT VIC AB 2-0 SH 27X BRD (SUTURE) ×2 IMPLANT
SUT VIC AB 3-0 SH 8-18 (SUTURE) ×1 IMPLANT
SYR 27GX1/2 1ML LL SAFETY (SYRINGE) ×3 IMPLANT
TOWEL OR NON WOVEN STRL DISP B (DISPOSABLE) ×3 IMPLANT
TROCAR Z-THREAD FIOS 5X100MM (TROCAR) ×1 IMPLANT
WATER STERILE IRR 1000ML POUR (IV SOLUTION) ×3 IMPLANT

## 2021-10-04 NOTE — Anesthesia Procedure Notes (Signed)
Procedure Name: Intubation Date/Time: 10/04/2021 11:25 AM  Performed by: Maxwell Caul, CRNAPre-anesthesia Checklist: Patient identified, Emergency Drugs available, Suction available and Patient being monitored Patient Re-evaluated:Patient Re-evaluated prior to induction Oxygen Delivery Method: Circle system utilized Preoxygenation: Pre-oxygenation with 100% oxygen Induction Type: IV induction Ventilation: Mask ventilation without difficulty Laryngoscope Size: Mac and 4 Grade View: Grade I Tube type: Oral Tube size: 7.5 mm Number of attempts: 1 Airway Equipment and Method: Stylet Placement Confirmation: ETT inserted through vocal cords under direct vision, positive ETCO2 and breath sounds checked- equal and bilateral Secured at: 22 cm Tube secured with: Tape Dental Injury: Teeth and Oropharynx as per pre-operative assessment  Comments: Intubation by EMT student

## 2021-10-04 NOTE — Progress Notes (Signed)
Patient ID: Ivan Lopez, male   DOB: [birth year], 66 y.o.   MRN: [id number]  Post-op note  Subjective: The patient is doing well.  No complaints.  Objective: Vital signs in last 24 hours: Temp:  [97.8 F (36.6 C)-98.1 F (36.7 C)] 97.8 F (36.6 C) (07/10 1556) Pulse Rate:  [98-103] 99 (07/10 1645) Resp:  [12-21] 14 (07/10 1645) BP: (143-171)/(75-90) 158/79 (07/10 1645) SpO2:  [100 %] 100 % (07/10 1645) Weight:  [71.7 kg] 71.7 kg (07/10 0933)  Intake/Output from previous day: No intake/output data recorded. Intake/Output this shift: Total I/O In: 2000 [I.V.:1900; IV Piggyback:100] Out: 450 [Blood:450]  Physical Exam:  General: Alert and oriented. Abdomen: Soft, Nondistended. Incisions: Clean and dry. GU: Urine clearing.  Lab Results: Recent Labs    10/04/21 1642  HGB 12.7*  HCT 38.8*    Assessment/Plan: POD#0   1) Continue to monitor, ambulate, IS   Ivan Lopez. MD   LOS: 0 days   Ivan Lopez 10/04/2021, 5:07 PM

## 2021-10-04 NOTE — Discharge Instructions (Addendum)
[facility], [place] [phone]  OPEN ABDOMINAL SURGERY: POST OP INSTRUCTIONS  Always review your discharge instruction sheet given to you by the facility where your surgery was performed.  IF YOU HAVE DISABILITY OR FAMILY LEAVE FORMS, YOU MUST BRING THEM TO THE OFFICE FOR PROCESSING.  PLEASE DO NOT GIVE THEM TO YOUR DOCTOR.  A prescription for pain medication may be given to you upon discharge.  Take your pain medication as prescribed, if needed.  If narcotic pain medicine is not needed, then you may take acetaminophen (Tylenol) or ibuprofen (Advil) as needed. Take your usually prescribed medications unless otherwise directed. If you need a refill on your pain medication, please contact your pharmacy. They will contact our office to request authorization.  Prescriptions will not be filled after 5pm or on week-ends. You should follow a light diet the first few days after arrival home, such as soup and crackers, pudding, etc.unless your doctor has advised otherwise. A high-fiber, low fat diet can be resumed as tolerated.   Be sure to include lots of fluids daily. Most patients will experience some swelling and bruising on the chest and neck area.  Ice packs will help.  Swelling and bruising can take several days to resolve Most patients will experience some swelling and bruising in the area of the incision. Ice pack will help. Swelling and bruising can take several days to resolve..  It is common to experience some constipation if taking pain medication after surgery.  Increasing fluid intake and taking a stool softener will usually help or prevent this problem from occurring.  A mild laxative (Milk of Magnesia or Miralax) should be taken according to package directions if there are no bowel movements after 48 hours.  You may have steri-strips (small skin tapes) in place directly over the incision.  These strips should be left on the skin for 7-10 days.  If your surgeon used skin  glue on the incision, you may shower in 24 hours.  The glue will flake off over the next 2-3 weeks.  Any sutures or staples will be removed at the office during your follow-up visit. You may find that a light gauze bandage over your incision may keep your staples from being rubbed or pulled. You may shower and replace the bandage daily. ACTIVITIES:  You may resume regular (light) daily activities beginning the next day--such as daily self-care, walking, climbing stairs--gradually increasing activities as tolerated.  You may have sexual intercourse when it is comfortable.  Refrain from any heavy lifting or straining until approved by your doctor. You may drive when you no longer are taking prescription pain medication, you can comfortably wear a seatbelt, and you can safely maneuver your car and apply brakes  You should see your doctor in the office for a follow-up appointment approximately two weeks after your surgery.  Make sure that you call for this appointment within a day or two after you arrive home to insure a convenient appointment time.  WHEN TO CALL YOUR DOCTOR: Fever over 101.0 Inability to urinate Nausea and/or vomiting Extreme swelling or bruising Continued bleeding from incision. Increased pain, redness, or drainage from the incision. Difficulty swallowing or breathing Muscle cramping or spasms. Numbness or tingling in hands or feet or around lips.  The clinic staff is available to answer your questions during regular business hours.  Please don't hesitate to call and ask to speak to one of the nurses if you have concerns.  For  further questions, please visit [email or web address]   WOUND CARE: - dressing to be changed twice daily - supplies: packing strips, scissors, gauze, tape  - remove dressing and all packing carefully - clean edges of skin around the wound with water/gauze, making sure there is no tape debris or leakage left on skin that could cause skin  irritation or breakdown. - loosely pack openings in wound with packing strips to wick open and allow drainage - cover wound with dry gauze and secure with tape.  - change dressing as needed if leakage occurs, wound gets contaminated, or patient requests to shower. - patient may shower daily with wound open and following the shower the wound should be dried and a clean dressing placed.         Flush abdominal drain with 5 cc sterile saline once daily, record output of drain and change gauze dressing over drain site every 2 to 3 days.  Call [phone] with any drain related questions.

## 2021-10-04 NOTE — Progress Notes (Signed)
   10/04/21 1855  Charting Type  Focused Reassessment Changes Noted Respiratory  Respiratory  Respiratory Interventions Incentive spirometry w/ teach back  Incentive Spirometry  IS Goal (mL) (RN or RT) 1750 mL  IS - Achieved (mL) (RN, NT, or RT) 2000 mL  IS - # of Times (RN or NT) 10  IS Effort (RN) Satisfactory  IS Use (NT) Observed, patient had no questions   Layla Maw, RN

## 2021-10-04 NOTE — Transfer of Care (Signed)
Immediate Anesthesia Transfer of Care Note  Patient: Ivan Lopez  Procedure(s) Performed: XI ROBOTIC ASSISTED LAPAROSCOPIC RADICAL PROSTATECTOMY LEVEL 3 LYMPHADENECTOMY, PELVIC (Bilateral) LAPAROSCOPIC LYSIS OF ADHESIONS  Patient Location: PACU  Anesthesia Type:General  Level of Consciousness: sedated  Airway & Oxygen Therapy: Patient Spontanous Breathing and Patient connected to face mask oxygen  Post-op Assessment: Report given to RN and Post -op Vital signs reviewed and stable  Post vital signs: Reviewed and stable  Last Vitals:  Vitals Value Taken Time  BP 145/75 10/04/21 1556  Temp    Pulse 99 10/04/21 1559  Resp 12 10/04/21 1558  SpO2 100 % 10/04/21 1559  Vitals shown include unvalidated device data.  Last Pain:  Vitals:   10/04/21 0935  TempSrc: Oral  PainSc:       Patients Stated Pain Goal: 4 (64/40/34 7425)  Complications: No notable events documented.

## 2021-10-04 NOTE — Interval H&P Note (Signed)
History and Physical Interval Note:  10/04/2021 10:28 AM  Ivan Lopez  has presented today for surgery, with the diagnosis of PROSTATE CANCER.  The various methods of treatment have been discussed with the patient and family. After consideration of risks, benefits and other options for treatment, the patient has consented to  Procedure(s): XI ROBOTIC ASSISTED LAPAROSCOPIC RADICAL PROSTATECTOMY LEVEL 3 (N/A) LYMPHADENECTOMY, PELVIC (Bilateral) as a surgical intervention.  The patient's history has been reviewed, patient examined, no change in status, stable for surgery.  I have reviewed the patient's chart and labs.  Questions were answered to the patient's satisfaction.     [employer]

## 2021-10-04 NOTE — H&P (Signed)
Office Visit Report     09/14/2021   --------------------------------------------------------------------------------   Ivan Lopez  MRN: [id number]  DOB: 18-Mar-1956, 66 year old Male  SSN:    PRIMARY CARE:    REFERRING:  Nicoletta Dress, MD  PROVIDER:  Raynelle Bring, M.D.  TREATING:  [address] [place], [place]  LOCATION:  [facility] [phone]     --------------------------------------------------------------------------------   CC/HPI: Pt presents today for pre-operative history and physical exam in anticipation of robotic assisted lap radical prostatectomy with bilateral pelvic lymph node dissection (possible open) by Dr. Alinda Money on 10/04/21. He is doing well and is without complaint.   His wife is with him at the appt today and explains that their daughter passed away last 2023-03-17 so she is VERY nervous about Ivan Lopez undergoing surgery.   Pt denies F/C, HA, CP, SOB, N/V, diarrhea/constipation, back pain, flank pain, hematuria, and dysuria.     HX:   CC: Prostate Cancer   PCP: Dr. Nelda Bucks   Ivan Lopez is a 66 year old gentleman who I initially evaluated in consultation in July 2018. At that time, he had been noted to have an elevated PSA of 5.7 prompting a transrectal ultrasound-guided prostate biopsy by Dr. Joie Bimler on 08/09/16. 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 I recommended therapy of curative intent at that time considering his disease parameters and life expectancy. He elected to forego treatment which was against my medical advice. He ultimately did not follow-up with his primary urologist or any other urologist after 2018.   He developed acute urinary retention in July 2022 requiring urethral catheter placement. His PSA at that time was 6.5. He was started on alpha blocker therapy with tamsulosin and also was started on finasteride. After a couple of voiding trials, he  finally passed a voiding trial and states that he has been voiding relatively well since then. He presents today for further discussion regarding his options for management of his prostate cancer. At this time, he subjectively feels that he is voiding well on combination medical therapy. IPSS is 8. SHIM score is 16.   He underwent an evaluation for microscopic hematuria including CT imaging and cystoscopy that indicated BPH with a friable prostate as the most likely cause.   He proceeded with a TRUS biopsy of the prostate on 03/03/21 to further evaluate his prostate cancer situation. This demonstrated a large 128.5 cc gland with a intravesical component. His biopsy indicated 2 out of 12 biopsy cores positive for Gleason 3+3=6 adenocarcinoma suggesting no progression of his malignancy since 2018.   Family history: None.   Imaging studies: None recently.   PMH: He has a history of hyperlipidemia.  PSH: He has undergone a prior lower midline incision for a ruptured appendix. He has undergone a left orchiectomy for trauma as a teenager.   TNM stage: cT1c N0 Mx  PSA: 5.7  Gleason score: 3+4=7  Biopsy (08/09/16 - read by Dr. Haywood Lasso, [facility], accession number [id number]): 3/12 cores positive  Left: Left base (25%, 3+3=6)  Right: Right apex (20%, 3+3=6), right lateral base (20%, 3+4=7)  Prostate volume: 66.2 cc   Urinary function: IPSS is 8.  Erectile function: SHIM score is 16. He has had slightly progressive erectile dysfunction since his last visit with me in 2018.     ALLERGIES: None   MEDICATIONS: Finasteride 5 mg tablet 1 tablet PO Daily  Finasteride 5 mg  tablet  Tamsulosin Hcl 0.4 mg capsule 1 capsule PO Q HS  Tamsulosin Hcl 0.4 mg capsule  Atorvastatin Calcium     GU PSH: Cystoscopy - 02/02/2021 Locm 300-'399Mg'$ /Ml Iodine,1Ml - 02/15/2021 Prostate Needle Biopsy - 03/03/2021 Simple orchiectomy, Left     NON-GU PSH: Appendectomy (open) Surgical Pathology, Gross  And Microscopic Examination For Prostate Needle - 03/03/2021     GU PMH: BPH w/LUTS - 04/06/2021, - 02/02/2021 Prostate Cancer - 04/06/2021, - 03/03/2021, - 02/02/2021, - 2018 Weak Urinary Stream - 04/06/2021, - 02/02/2021 Microscopic hematuria - 02/15/2021, - 02/02/2021      PMH Notes:   1) Prostate cancer: He was noted to have an elevated PSA of 5.7 prompting a transrectal ultrasound-guided prostate biopsy by Dr. Joie Bimler on 08/09/16. 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   Family history: None.   Imaging studies: CT scan of the abdomen and pelvis without findings to suggest metastatic disease.   PMH: He has a history of hyperlipidemia.  PSH: He has undergone a prior lower midline incision for a ruptured appendix. He has undergone a left orchiectomy for trauma as a teenager.   TNM stage: cT1c N0 Mx  PSA: 5.7  Gleason score: 3+4=7  Biopsy (08/09/16 - read by Dr. Haywood Lasso, [facility], accession number [id number]): 3/12 cores positive  Left: Left base (25%, 3+3=6)  Right: Right apex (20%, 3+3=6), right lateral base (20%, 3+4=7)  Prostate volume: 66.2 cc   Urinary function: IPSS is 11.  Erectile function: SHIM score is 19. He is able to achieve erections adequate for intercourse without the need for medication.    NON-GU PMH: Hypercholesterolemia    FAMILY HISTORY: 3 daughters - Runs in Family 3 Son's - Runs in Family   SOCIAL HISTORY: Marital Status: Married Preferred Language: English; Ethnicity: Not Hispanic Or Latino; Race: Black or African American Current Smoking Status: Patient has never smoked.   Tobacco Use Assessment Completed: Used Tobacco in last 30 days? Does not use smokeless tobacco. Does drink.  Does not use drugs. Drinks 3 caffeinated drinks per day. Has not had a blood transfusion.     Notes: ETOH rare    REVIEW OF SYSTEMS:    GU Review Male:   Patient reports get up at  night to urinate. Patient denies frequent urination, hard to postpone urination, burning/ pain with urination, leakage of urine, stream starts and stops, trouble starting your stream, have to strain to urinate , erection problems, and penile pain.  Gastrointestinal (Upper):   Patient denies indigestion/ heartburn, vomiting, and nausea.  Gastrointestinal (Lower):   Patient denies diarrhea and constipation.  Constitutional:   Patient denies fever, night sweats, weight loss, and fatigue.  Skin:   Patient denies skin rash/ lesion and itching.  Eyes:   Patient denies blurred vision and double vision.  Ears/ Nose/ Throat:   Patient denies sore throat and sinus problems.  Hematologic/Lymphatic:   Patient denies swollen glands and easy bruising.  Cardiovascular:   Patient denies leg swelling and chest pains.  Respiratory:   Patient denies cough and shortness of breath.  Endocrine:   Patient denies excessive thirst.  Musculoskeletal:   Patient denies back pain and joint pain.  Neurological:   Patient denies headaches and dizziness.  Psychologic:   Patient denies depression and anxiety.   VITAL SIGNS:      09/14/2021 01:07 PM  Weight 160 lb / 72.57 kg  Height 71 in /  180.34 cm  BP 128/77 mmHg  Heart Rate 85 /min  Temperature 98.4 F / 36.8 C  BMI 22.3 kg/m   MULTI-SYSTEM PHYSICAL EXAMINATION:    Constitutional: Well-nourished. No physical deformities. Normally developed. Good grooming.  Neck: Neck symmetrical, not swollen. Normal tracheal position.  Respiratory: Normal breath sounds. No labored breathing, no use of accessory muscles.   Cardiovascular: Regular rate and rhythm. No murmur, no gallop.   Lymphatic: No enlargement of neck, axillae, groin.  Skin: No paleness, no jaundice, no cyanosis. No lesion, no ulcer, no rash.  Neurologic / Psychiatric: Oriented to time, oriented to place, oriented to person. No depression, no anxiety, no agitation.  Gastrointestinal: No mass, no tenderness, no  rigidity, non obese abdomen.  Eyes: Normal conjunctivae. Normal eyelids.  Ears, Nose, Mouth, and Throat: Left ear no scars, no lesions, no masses. Right ear no scars, no lesions, no masses. Nose no scars, no lesions, no masses. Normal hearing. Normal lips.  Musculoskeletal: Normal gait and station of head and neck.     Complexity of Data:  Records Review:   Previous Patient Records  Urine Test Review:   Urinalysis   09/14/21  Urinalysis  Urine Appearance Clear   Urine Color Straw   Urine Glucose Neg mg/dL  Urine Bilirubin Neg mg/dL  Urine Ketones Neg mg/dL  Urine Specific Gravity <=1.005   Urine Blood Neg ery/uL  Urine pH <=5.0   Urine Protein Neg mg/dL  Urine Urobilinogen 0.2 mg/dL  Urine Nitrites Neg   Urine Leukocyte Esterase Neg leu/uL   PROCEDURES:          Urinalysis - 81003 Dipstick Dipstick Cont'd  Color: Straw Bilirubin: Neg mg/dL  Appearance: Clear Ketones: Neg mg/dL  Specific Gravity: <=1.005 Blood: Neg ery/uL  pH: <=5.0 Protein: Neg mg/dL  Glucose: Neg mg/dL Urobilinogen: 0.2 mg/dL    Nitrites: Neg    Leukocyte Esterase: Neg leu/uL    ASSESSMENT:      ICD-10 Details  1 GU:   Prostate Cancer - C61    PLAN:            Medications Stop Meds: Levofloxacin 750 mg tablet Please take one tablet the morning of your biopsy.  Start: 02/02/2021  Discontinue: 09/14/2021  - Reason: The medication cycle was completed.            Schedule Return Visit/Planned Activity: Keep Scheduled Appointment - Schedule Surgery          Document Letter(s):  Created for Patient: Clinical Summary         Notes:   There are no changes in the patients history or physical exam since last evaluation by Dr. Alinda Money. Pt is scheduled to undergo RALP with BPLND (possibly open) on 10/04/21.   All pt's questions were answered to the best of my ability.          Next Appointment:      Next Appointment: 10/04/2021 11:30 AM    Appointment Type: Surgery     Location: [facility] [phone]    Provider: Raynelle Bring, M.D.    Reason for Visit: WL/EXT REC RA LAP RAD PROSTATECTOMY LEVEL 3, BPLA WITH AMANDA      * Signed by Mcarthur Rossetti, PA on 09/14/21 at 1:34 PM (EDT)*     APPENDED NOTES:    I was notified that Mr. Olden has made the decision to refuse blood products for religious reasons. I therefore called him today and discussed the risk of possible  open surgical conversion considering his prior surgical history and the risk for more significant blood loss that could be associated with this. After discussing this in more detail, he reaffirmed his decision that he does wish to refuse blood products even if necessary. He understands the risk of mortality in that worst case scenario and accepts that risk. He wishes to proceed as planned on Monday.    * Signed by Raynelle Bring, M.D. on 09/30/21 at 4:41 PM (EDT)*

## 2021-10-04 NOTE — Op Note (Signed)
Addendum previous intraoperative consultation note  At the completion of prostatectomy, I scrubbed back in the case.  I reexamined the small bowel that was dissected down to the anterior abdominal wall which appeared to be the mid to distal terminal ileum.  This was done at the extraction site of the prostate specimen.  Upon examination I saw no full-thickness injury.  There is no ischemic areas to the bowel wall.  There were 2 areas that looked as if the serosa was mildly thinned and I closed these but these did not appear to be full-thickness injuries upon visual examination.  There were some adhesions noted but nonobstructing.  The small bowel was reduced back into the abdominal cavity.  Closure of the abdominal wall per Dr. Festus Holts note.  All counts appear correct the patient was hemodynamically stable.

## 2021-10-04 NOTE — Anesthesia Preprocedure Evaluation (Addendum)
Anesthesia Evaluation  Patient identified by MRN, date of birth, ID band Patient awake    Reviewed: Allergy & Precautions, NPO status , Patient's Chart, lab work & pertinent test results  Airway Mallampati: II  TM Distance: >3 FB Neck ROM: Full    Dental no notable dental hx.    Pulmonary neg pulmonary ROS,    Pulmonary exam normal breath sounds clear to auscultation       Cardiovascular negative cardio ROS Normal cardiovascular exam Rhythm:Regular Rate:Normal     Neuro/Psych negative neurological ROS  negative psych ROS   GI/Hepatic negative GI ROS, Neg liver ROS,   Endo/Other  negative endocrine ROS  Renal/GU negative Renal ROS  negative genitourinary   Musculoskeletal negative musculoskeletal ROS (+)   Abdominal   Peds negative pediatric ROS (+)  Hematology  (+) REFUSES BLOOD PRODUCTS,   Anesthesia Other Findings Prostate ca  Reproductive/Obstetrics negative OB ROS                            Anesthesia Physical Anesthesia Plan  ASA: 3  Anesthesia Plan: General   Post-op Pain Management: Tylenol PO (pre-op)*   Induction: Intravenous  PONV Risk Score and Plan: 2 and Treatment may vary due to age or medical condition and Ondansetron  Airway Management Planned: Oral ETT  Additional Equipment: None  Intra-op Plan:   Post-operative Plan: Extubation in OR  Informed Consent: I have reviewed the patients History and Physical, chart, labs and discussed the procedure including the risks, benefits and alternatives for the proposed anesthesia with the patient or authorized representative who has indicated his/her understanding and acceptance.     Dental advisory given  Plan Discussed with: CRNA, Anesthesiologist and Surgeon  Anesthesia Plan Comments: (Patient refuses blood products. Had a long discussion with patient. He is not jehovah's witness. He repeatedly stated even if it  was the only thing that would save his life, he does NOT want any blood products. Norton Blizzard, MD  )       Anesthesia Quick Evaluation

## 2021-10-04 NOTE — Op Note (Signed)
Preoperative diagnosis: Clinically localized adenocarcinoma of the prostate (clinical stage T1c Nx Mx)  Postoperative diagnosis: Clinically localized adenocarcinoma of the prostate (clinical stage T1c Nx Mx)  Procedure:  Robotic assisted laparoscopic radical prostatectomy (bilateral nerve sparing) Bilateral robotic assisted laparoscopic pelvic lymphadenectomy  Surgeon: Pryor Curia. M.D.  Assistant: Debbrah Alar, PA-C  An assistant was required for this surgical procedure.  The duties of the assistant included but were not limited to suctioning, passing suture, camera manipulation, retraction. This procedure would not be able to be performed without an Environmental consultant.  Resident: Dr. Hinton Rao  Anesthesia: General  Complications: None  EBL: 450 mL  IVF:  1400 mL crystalloid  Specimens: Prostate and seminal vesicles Right pelvic lymph nodes Left pelvic lymph nodes  Disposition of specimens: Pathology  Drains: 20 Fr coude catheter # 19 Blake pelvic drain  Indication: Ivan Lopez is a 66 y.o. year old patient with clinically localized prostate cancer.  After a thorough review of the management options for treatment of prostate cancer, he elected to proceed with surgical therapy and the above procedure(s).  We have discussed the potential benefits and risks of the procedure, side effects of the proposed treatment, the likelihood of the patient achieving the goals of the procedure, and any potential problems that might occur during the procedure or recuperation. Informed consent has been obtained.  Description of procedure:  The patient was taken to the operating room and a general anesthetic was administered. He was given preoperative antibiotics, placed in the dorsal lithotomy position, and prepped and draped in the usual sterile fashion. Next a preoperative timeout was performed. A urethral catheter was placed into the bladder and a site was selected near the umbilicus  for placement of the camera port. This was placed using a standard open Hassan technique which allowed entry into the peritoneal cavity under direct vision and without difficulty. An 8 mm robotic port was placed and a pneumoperitoneum established. The camera was then used to inspect the abdomen and there was evidence of extensive small bowel adhesions to the abdominal wall in the lower midline. The remaining abdominal ports were then placed. 8 mm robotic ports were placed in the right lower quadrant, left lower quadrant, and far left lateral abdominal wall. A 5 mm port was placed in the right upper quadrant and a 12 mm port was placed in the right lateral abdominal wall for laparoscopic assistance.  An additional 5 mm port was placed in the lower right abdomen for assistance with the laparoscopic adhesiolysis.  All ports were placed under direct vision without difficulty.    Utilizing laparoscopic scissors, I began taking down some of the adhesions from the abdominal wall.  However, it was clear that there was very extensive small bowel adhesions to the abdominal wall.  As such, I did obtain a general surgery consultation.  Dr. Christie Beckers evaluated the situation and provided help by performing a laparoscopic adhesiolysis allowing all of the small bowel adhesions to be taken down.  Once the laparoscopic adhesiolysis was complete, the surgical cart was then docked.   Utilizing the cautery scissors, the bladder was reflected posteriorly allowing entry into the space of Retzius and identification of the endopelvic fascia and prostate. The periprostatic fat was then removed from the prostate allowing full exposure of the endopelvic fascia. The endopelvic fascia was then incised from the apex back to the base of the prostate bilaterally and the underlying levator muscle fibers were swept laterally off the prostate thereby isolating  the dorsal venous complex. The dorsal vein was then stapled and divided with a 45 mm  Flex Echelon stapler. Attention then turned to the bladder neck which was divided anteriorly thereby allowing entry into the bladder and exposure of the urethral catheter. The catheter balloon was deflated and the catheter was brought into the operative field and used to retract the prostate anteriorly. The posterior bladder neck was then examined.  There was an extremely large median lobe intravesically.  This was able to be enucleated and dissection proceeded posteriorly along the posterior bladder neck to be divided allowing further dissection between the bladder and prostate posteriorly until the vasa deferentia and seminal vessels were identified. The vasa deferentia were isolated, divided, and lifted anteriorly. The seminal vesicles were dissected down to their tips with care to control the seminal vascular arterial blood supply. These structures were then lifted anteriorly and the space between Denonvillier's fascia and the anterior rectum was developed with a combination of sharp and blunt dissection. This isolated the vascular pedicles of the prostate.  The lateral prostatic fascia was then sharply incised allowing release of the neurovascular bundles bilaterally. The vascular pedicles of the prostate were then ligated with Weck clips between the prostate and neurovascular bundles and divided with sharp cold scissor dissection resulting in neurovascular bundle preservation. The neurovascular bundles were then separated off the apex of the prostate and urethra bilaterally.  The urethra was then sharply transected allowing the prostate specimen to be disarticulated. The pelvis was copiously irrigated and hemostasis was ensured. There was no evidence for rectal injury.  Attention then turned to the right pelvic sidewall. The fibrofatty tissue between the external iliac vein, confluence of the iliac vessels, hypogastric artery, and Cooper's ligament was dissected free from the pelvic sidewall with care to  preserve the obturator nerve. Weck clips were used for lymphostasis and hemostasis. An identical procedure was performed on the contralateral side and the lymphatic packets were removed for permanent pathologic analysis.  Attention then turned to the urethral anastomosis. A 2-0 Vicryl slip knot was placed between Denonvillier's fascia, the posterior bladder neck, and the posterior urethra to reapproximate these structures. A double-armed 3-0 Monocryl suture was then used to perform a 360 running tension-free anastomosis between the bladder neck and urethra. A new urethral catheter was then placed into the bladder and irrigated. There were no blood clots within the bladder and the anastomosis appeared to be watertight. A #19 Blake drain was then brought through the left lateral 8 mm port site and positioned appropriately within the pelvis. It was secured to the skin with a nylon suture. The surgical cart was then undocked. The right lateral 12 mm port site was closed at the fascial level with a 0 Vicryl suture placed laparoscopically. All remaining ports were then removed under direct vision. The prostate specimen was removed intact within the Endopouch retrieval bag via the periumbilical camera port site.  This incision was extended slightly superiorly and the small intestine was brought out of the incision for examination.  Dr. Brantley Stage returned and ran the bowel.  He did place a couple of 3-0 Vicryl sutures in the small intestine as a precaution.  The small bowel was then placed back into the abdomen.  This fascial opening was closed with two running 0 PDS sutures. 0.25% Marcaine was then injected into all port sites and all incisions were reapproximated at the skin level with 4-0 Monocryl subcuticular sutures and Dermabond. The patient appeared to tolerate the procedure well  and without complications. The patient was able to be extubated and transferred to the recovery unit in satisfactory  condition.   Pryor Curia MD

## 2021-10-04 NOTE — Consult Note (Signed)
Intraoperative consultation note  Called the OR room 3 with Dr. Hart Rochester of urology during a robotic prostatectomy.  The patient was noted to have dense intra-abdominal adhesions from previous laparotomy.  After evaluation the patient properly, he asked for my assistance in lysing adhesions.  Preoperative diagnosis: Previous history laparotomy with dense intra-abdominal adhesions  Postoperative diagnosis: Same  Procedure: Laparoscopic lysis of adhesions  Co-surgeons Dr. Marcello Moores Carrington Mullenax Dr. [name]  EBL: Minimal  Specimen: None  Anesthesia: General  Indications for procedure: The patient is undergoing robotic prostatectomy.  He was found to have intra-abdominal lesions and general surgery consultation was initiated.  Intraoperative findings: Dense midline adhesions from previous scar.  Total time for lysis of adhesions 1 hour.  Description of procedure: The patient was already prepped and draped intubated.  Ports were already placed by urology.  When scrubbed him he had dense small bowel adhesions to his anterior abdominal wall in the midline right lower quadrant.  Tedious sharp dissection was used to dissect the adhesions off the anterior abdominal wall.  This was quite slow and tedious.  At the end of the procedure the bowel suspected there is no obvious injury or signs of succus or stool leakage.  EBL was minimal.  At this point scrubbed out to let Dr. Alinda Money complete the prostatectomy.  The patient was stable.  All counts were found to be correct.

## 2021-10-05 ENCOUNTER — Encounter (HOSPITAL_COMMUNITY): Payer: Self-pay | Admitting: Surgery

## 2021-10-05 DIAGNOSIS — K7689 Other specified diseases of liver: Secondary | ICD-10-CM | POA: Diagnosis not present

## 2021-10-05 DIAGNOSIS — K56600 Partial intestinal obstruction, unspecified as to cause: Secondary | ICD-10-CM | POA: Diagnosis not present

## 2021-10-05 DIAGNOSIS — R109 Unspecified abdominal pain: Secondary | ICD-10-CM | POA: Diagnosis not present

## 2021-10-05 DIAGNOSIS — Y838 Other surgical procedures as the cause of abnormal reaction of the patient, or of later complication, without mention of misadventure at the time of the procedure: Secondary | ICD-10-CM | POA: Diagnosis present

## 2021-10-05 DIAGNOSIS — K5651 Intestinal adhesions [bands], with partial obstruction: Secondary | ICD-10-CM | POA: Diagnosis not present

## 2021-10-05 DIAGNOSIS — K5939 Other megacolon: Secondary | ICD-10-CM | POA: Diagnosis not present

## 2021-10-05 DIAGNOSIS — K769 Liver disease, unspecified: Secondary | ICD-10-CM | POA: Diagnosis not present

## 2021-10-05 DIAGNOSIS — K3189 Other diseases of stomach and duodenum: Secondary | ICD-10-CM | POA: Diagnosis not present

## 2021-10-05 DIAGNOSIS — J9811 Atelectasis: Secondary | ICD-10-CM | POA: Diagnosis not present

## 2021-10-05 DIAGNOSIS — K6811 Postprocedural retroperitoneal abscess: Secondary | ICD-10-CM | POA: Diagnosis not present

## 2021-10-05 DIAGNOSIS — K4090 Unilateral inguinal hernia, without obstruction or gangrene, not specified as recurrent: Secondary | ICD-10-CM | POA: Diagnosis not present

## 2021-10-05 DIAGNOSIS — K651 Peritoneal abscess: Secondary | ICD-10-CM | POA: Diagnosis not present

## 2021-10-05 DIAGNOSIS — R140 Abdominal distension (gaseous): Secondary | ICD-10-CM | POA: Diagnosis not present

## 2021-10-05 DIAGNOSIS — K56699 Other intestinal obstruction unspecified as to partial versus complete obstruction: Secondary | ICD-10-CM | POA: Diagnosis not present

## 2021-10-05 DIAGNOSIS — K55021 Focal (segmental) acute infarction of small intestine: Secondary | ICD-10-CM | POA: Diagnosis not present

## 2021-10-05 DIAGNOSIS — Z9889 Other specified postprocedural states: Secondary | ICD-10-CM | POA: Diagnosis not present

## 2021-10-05 DIAGNOSIS — R339 Retention of urine, unspecified: Secondary | ICD-10-CM | POA: Diagnosis present

## 2021-10-05 DIAGNOSIS — K654 Sclerosing mesenteritis: Secondary | ICD-10-CM | POA: Diagnosis not present

## 2021-10-05 DIAGNOSIS — Z978 Presence of other specified devices: Secondary | ICD-10-CM | POA: Diagnosis not present

## 2021-10-05 DIAGNOSIS — K55019 Acute (reversible) ischemia of small intestine, extent unspecified: Secondary | ICD-10-CM | POA: Diagnosis not present

## 2021-10-05 DIAGNOSIS — M47816 Spondylosis without myelopathy or radiculopathy, lumbar region: Secondary | ICD-10-CM | POA: Diagnosis not present

## 2021-10-05 DIAGNOSIS — R066 Hiccough: Secondary | ICD-10-CM | POA: Diagnosis not present

## 2021-10-05 DIAGNOSIS — K9189 Other postprocedural complications and disorders of digestive system: Secondary | ICD-10-CM | POA: Diagnosis not present

## 2021-10-05 DIAGNOSIS — N3289 Other specified disorders of bladder: Secondary | ICD-10-CM | POA: Diagnosis not present

## 2021-10-05 DIAGNOSIS — K6389 Other specified diseases of intestine: Secondary | ICD-10-CM | POA: Diagnosis not present

## 2021-10-05 DIAGNOSIS — B9689 Other specified bacterial agents as the cause of diseases classified elsewhere: Secondary | ICD-10-CM | POA: Diagnosis not present

## 2021-10-05 DIAGNOSIS — K9131 Postprocedural partial intestinal obstruction: Secondary | ICD-10-CM | POA: Diagnosis not present

## 2021-10-05 DIAGNOSIS — K56609 Unspecified intestinal obstruction, unspecified as to partial versus complete obstruction: Secondary | ICD-10-CM | POA: Diagnosis not present

## 2021-10-05 DIAGNOSIS — C61 Malignant neoplasm of prostate: Secondary | ICD-10-CM | POA: Diagnosis present

## 2021-10-05 DIAGNOSIS — R188 Other ascites: Secondary | ICD-10-CM | POA: Diagnosis not present

## 2021-10-05 DIAGNOSIS — T8143XA Infection following a procedure, organ and space surgical site, initial encounter: Secondary | ICD-10-CM | POA: Diagnosis not present

## 2021-10-05 DIAGNOSIS — S36898A Other injury of other intra-abdominal organs, initial encounter: Secondary | ICD-10-CM | POA: Diagnosis not present

## 2021-10-05 DIAGNOSIS — I700 Atherosclerosis of aorta: Secondary | ICD-10-CM | POA: Diagnosis not present

## 2021-10-05 DIAGNOSIS — S36438A Laceration of other part of small intestine, initial encounter: Secondary | ICD-10-CM | POA: Diagnosis not present

## 2021-10-05 DIAGNOSIS — K668 Other specified disorders of peritoneum: Secondary | ICD-10-CM | POA: Diagnosis not present

## 2021-10-05 DIAGNOSIS — K8020 Calculus of gallbladder without cholecystitis without obstruction: Secondary | ICD-10-CM | POA: Diagnosis not present

## 2021-10-05 DIAGNOSIS — Z9049 Acquired absence of other specified parts of digestive tract: Secondary | ICD-10-CM | POA: Diagnosis not present

## 2021-10-05 DIAGNOSIS — N529 Male erectile dysfunction, unspecified: Secondary | ICD-10-CM | POA: Diagnosis present

## 2021-10-05 DIAGNOSIS — S36498A Other injury of other part of small intestine, initial encounter: Secondary | ICD-10-CM | POA: Diagnosis not present

## 2021-10-05 DIAGNOSIS — K567 Ileus, unspecified: Secondary | ICD-10-CM | POA: Diagnosis not present

## 2021-10-05 DIAGNOSIS — K529 Noninfective gastroenteritis and colitis, unspecified: Secondary | ICD-10-CM | POA: Diagnosis not present

## 2021-10-05 DIAGNOSIS — K565 Intestinal adhesions [bands], unspecified as to partial versus complete obstruction: Secondary | ICD-10-CM | POA: Diagnosis not present

## 2021-10-05 DIAGNOSIS — E7800 Pure hypercholesterolemia, unspecified: Secondary | ICD-10-CM | POA: Diagnosis not present

## 2021-10-05 DIAGNOSIS — Z4682 Encounter for fitting and adjustment of non-vascular catheter: Secondary | ICD-10-CM | POA: Diagnosis not present

## 2021-10-05 DIAGNOSIS — J90 Pleural effusion, not elsewhere classified: Secondary | ICD-10-CM | POA: Diagnosis not present

## 2021-10-05 DIAGNOSIS — K429 Umbilical hernia without obstruction or gangrene: Secondary | ICD-10-CM | POA: Diagnosis not present

## 2021-10-05 DIAGNOSIS — S36590A Other injury of ascending [right] colon, initial encounter: Secondary | ICD-10-CM | POA: Diagnosis not present

## 2021-10-05 DIAGNOSIS — Z79899 Other long term (current) drug therapy: Secondary | ICD-10-CM | POA: Diagnosis not present

## 2021-10-05 LAB — HEMOGLOBIN AND HEMATOCRIT, BLOOD
HCT: 35.1 % — ABNORMAL LOW (ref 39.0–52.0)
Hemoglobin: 11.5 g/dL — ABNORMAL LOW (ref 13.0–17.0)

## 2021-10-05 MED ORDER — CHLORHEXIDINE GLUCONATE CLOTH 2 % EX PADS: 6.0000 | MEDICATED_PAD | Freq: Every day | CUTANEOUS | Status: DC

## 2021-10-05 MED ORDER — BISACODYL 10 MG RE SUPP
10.0000 mg | Freq: Once | RECTAL | Status: AC
  Administered 2021-10-05: 10 mg via RECTAL
  Filled 2021-10-05: qty 1

## 2021-10-05 MED ORDER — CHLORHEXIDINE GLUCONATE CLOTH 2 % EX PADS
6.0000 | MEDICATED_PAD | Freq: Every day | CUTANEOUS | Status: DC
  Administered 2021-10-06 – 2021-11-06 (×30): 6 via TOPICAL

## 2021-10-05 MED ORDER — TRAMADOL HCL 50 MG PO TABS
50.0000 mg | ORAL_TABLET | Freq: Four times a day (QID) | ORAL | Status: DC | PRN
  Administered 2021-10-05 – 2021-10-10 (×5): 100 mg via ORAL
  Filled 2021-10-05 (×6): qty 2

## 2021-10-05 NOTE — Progress Notes (Signed)
Patient very motivated with ambulating in hallway, walking the hallway length 5 times throughout day shift.

## 2021-10-05 NOTE — Progress Notes (Signed)
  Transition of Care [facility]) Screening Note   Patient Details  Name: Ivan Lopez Date of Birth: 08-22-1955   Transition of Care [facility]) CM/SW Contact:    Dessa Phi, RN Phone Number: 10/05/2021, 2:43 PM    Transition of Care Department [facility]) has reviewed patient and no TOC needs have been identified at this time. We will continue to monitor patient advancement through interdisciplinary progression rounds. If new patient transition needs arise, please place a TOC consult.

## 2021-10-05 NOTE — Progress Notes (Signed)
Patient ID: Ivan Lopez, male   DOB: January 05, 1956, 66 y.o.   MRN: [id number]  1 Day Post-Op Subjective: The patient is doing well.  No nausea or vomiting. Pain is adequately controlled. No flatus.  Tolerating clears.  Objective: Vital signs in last 24 hours: Temp:  [97.6 F (36.4 C)-99.1 F (37.3 C)] 98.6 F (37 C) (07/11 0542) Pulse Rate:  [96-110] 104 (07/11 0542) Resp:  [10-21] 20 (07/11 0542) BP: (120-171)/(63-100) 137/76 (07/11 0542) SpO2:  [96 %-100 %] 99 % (07/11 0542) Weight:  [71.7 kg-74.4 kg] 74.4 kg (07/10 1739)  Intake/Output from previous day: 07/10 0701 - 07/11 0700 In: 2778.2 [P.O.:240; I.V.:2438.2; IV Piggyback:100] Out: 2241 [Urine:2475; Drains:220; Blood:450] Intake/Output this shift: No intake/output data recorded.  Physical Exam:  General: Alert and oriented. CV: RRR Lungs: Clear bilaterally. GI: Soft, Nondistended. Incisions: Clean, dry, and intact Urine: Clear Extremities: Nontender, no erythema, no edema.  Lab Results: Recent Labs    10/04/21 1642 10/05/21 0500  HGB 12.7* 11.5*  HCT 38.8* 35.1*      Assessment/Plan: POD# 1 s/p robotic prostatectomy and extensive laparoscopic adhesiolysis  1) SL IVF 2) Ambulate, Incentive spirometry 3) Transition to oral pain medication but will try to minimize narcotics 4) Dulcolax suppository 5) Will observe over the next 24 hrs due to high risk of postoperative ileus   Pryor Curia. MD   LOS: 0 days   [name] 10/05/2021, 7:41 AM

## 2021-10-05 NOTE — Anesthesia Postprocedure Evaluation (Signed)
Anesthesia Post Note  Patient: Ivan Lopez  Procedure(s) Performed: XI ROBOTIC ASSISTED LAPAROSCOPIC RADICAL PROSTATECTOMY LEVEL 3 LYMPHADENECTOMY, PELVIC (Bilateral) LAPAROSCOPIC LYSIS OF ADHESIONS     Patient location during evaluation: PACU Anesthesia Type: General Level of consciousness: awake Pain management: pain level controlled Vital Signs Assessment: post-procedure vital signs reviewed and stable Respiratory status: spontaneous breathing and respiratory function stable Cardiovascular status: stable Postop Assessment: no apparent nausea or vomiting Anesthetic complications: no   No notable events documented.  Last Vitals:  Vitals:   10/05/21 0128 10/05/21 0542  BP: 120/63 137/76  Pulse: (!) 104 (!) 104  Resp: 20 20  Temp: 37.3 C 37 C  SpO2: 96% 99%    Last Pain:  Vitals:   10/05/21 1643  TempSrc:   PainSc: 10-Worst pain ever   Pain Goal: Patients Stated Pain Goal: 3 (10/04/21 1753)                 Merlinda Frederick

## 2021-10-05 NOTE — Progress Notes (Addendum)
[facility] Progress Note  1 Day Post-Op  Subjective: CC:  Reports upper abdominal discomfort. Endorses belching. Denies nausea or vomiting. Denies any flatus or BM since surgery. Has already walked in the hall.   Wife and daughter at bedside.  Objective: Vital signs in last 24 hours: Temp:  [97.6 F (36.4 C)-99.1 F (37.3 C)] 98.6 F (37 C) (07/11 0542) Pulse Rate:  [96-110] 104 (07/11 0542) Resp:  [10-21] 20 (07/11 0542) BP: (120-171)/(63-100) 137/76 (07/11 0542) SpO2:  [96 %-100 %] 99 % (07/11 0542) Weight:  [74.4 kg] 74.4 kg (07/10 1739) Last BM Date : 10/04/21  Intake/Output from previous day: 07/10 0701 - 07/11 0700 In: 2778.2 [P.O.:240; I.V.:2438.2; IV Piggyback:100] Out: 4235 [Urine:2475; Drains:220; Blood:450] Intake/Output this shift: No intake/output data recorded.  PE: Gen:  Alert, NAD, pleasant Pulm:  Normal effort Abd: Soft, appropriately tender, abdominal distention with hypoactive BS, incisions c/d/I, drain SS  Skin: warm and dry, no rashes  Psych: A&Ox3   Lab Results:  Recent Labs    10/04/21 1642 10/05/21 0500  HGB 12.7* 11.5*  HCT 38.8* 35.1*   BMET No results for input(s): "NA", "K", "CL", "CO2", "GLUCOSE", "BUN", "CREATININE", "CALCIUM" in the last 72 hours. PT/INR No results for input(s): "LABPROT", "INR" in the last 72 hours. CMP     Component Value Date/Time   NA 138 09/27/2021 1142   K 3.7 09/27/2021 1142   CL 111 09/27/2021 1142   CO2 20 (L) 09/27/2021 1142   GLUCOSE 98 09/27/2021 1142   BUN 21 09/27/2021 1142   CREATININE 1.05 09/27/2021 1142   CALCIUM 8.8 (L) 09/27/2021 1142   GFRNONAA >60 09/27/2021 1142   Lipase  No results found for: "LIPASE"     Studies/Results: No results found.  Anti-infectives: Anti-infectives (From admission, onward)    Start     Dose/Rate Route Frequency Ordered Stop   10/04/21 2330  ceFAZolin (ANCEF) IVPB 1 g/50 mL premix        1 g 100 mL/hr over 30 Minutes Intravenous  Every 8 hours 10/04/21 1737 10/05/21 1014   10/04/21 0922  ceFAZolin (ANCEF) IVPB 2g/100 mL premix        2 g 200 mL/hr over 30 Minutes Intravenous 30 min pre-op 10/04/21 3614 10/04/21 1530   10/04/21 0000  sulfamethoxazole-trimethoprim (BACTRIM DS) 800-160 MG tablet        1 tablet Oral 2 times daily 10/04/21 1048          Assessment/Plan  POD#1  S/P Robotic assisted laparoscopic radical prostatectomy (bilateral nerve sparing) Dr. Alinda Money Bilateral robotic assisted laparoscopic pelvic lymphadenectomy Dr. Alinda Money Laparoscopic lysis of adesions, oversew of small bowel serosal injuries x2 Dr Brantley Stage   Afebrile, VSS Clinically he has a mild ileus- distention, belching, no flatus/BM yet Allow full liquids and await further bowel function No signs of intestinal injury/leak, monitor drain, currently serosanguinous    LOS: 0 days   I reviewed nursing notes, last 24 h vitals and pain scores, last 48 h intake and output, last 24 h labs and trends, and last 24 h imaging results.   Obie Dredge, PA-C [facility] Please see Amion for pager number during day hours 7:00am-4:30pm

## 2021-10-06 ENCOUNTER — Other Ambulatory Visit: Payer: Self-pay

## 2021-10-06 ENCOUNTER — Inpatient Hospital Stay (HOSPITAL_COMMUNITY): Payer: Medicare Other

## 2021-10-06 LAB — CREATININE, FLUID (PLEURAL, PERITONEAL, JP DRAINAGE): Creat, Fluid: 1.5 mg/dL

## 2021-10-06 LAB — HEMOGLOBIN AND HEMATOCRIT, BLOOD
HCT: 45.8 % (ref 39.0–52.0)
Hemoglobin: 14.8 g/dL (ref 13.0–17.0)

## 2021-10-06 MED ORDER — METOCLOPRAMIDE HCL 5 MG/ML IJ SOLN
5.0000 mg | Freq: Four times a day (QID) | INTRAMUSCULAR | Status: DC
  Administered 2021-10-06 – 2021-10-10 (×17): 5 mg via INTRAVENOUS
  Filled 2021-10-06 (×18): qty 2

## 2021-10-06 MED ORDER — BISACODYL 10 MG RE SUPP
10.0000 mg | Freq: Every day | RECTAL | Status: DC
  Administered 2021-10-08 – 2021-10-12 (×5): 10 mg via RECTAL
  Filled 2021-10-06 (×6): qty 1

## 2021-10-06 MED ORDER — BISACODYL 10 MG RE SUPP
10.0000 mg | Freq: Once | RECTAL | Status: AC
  Administered 2021-10-06: 10 mg via RECTAL
  Filled 2021-10-06: qty 1

## 2021-10-06 MED ORDER — KETOROLAC TROMETHAMINE 15 MG/ML IJ SOLN
15.0000 mg | Freq: Once | INTRAMUSCULAR | Status: AC
  Administered 2021-10-06: 15 mg via INTRAVENOUS
  Filled 2021-10-06: qty 1

## 2021-10-06 MED ORDER — SODIUM CHLORIDE 0.9 % IV SOLN: INTRAVENOUS | Status: AC

## 2021-10-06 NOTE — Plan of Care (Signed)

## 2021-10-06 NOTE — Progress Notes (Signed)
Patient ID: Ivan Lopez, male   DOB: [birth year], 66 y.o.   MRN: [id number] [facility] Surgery Progress Note  2 Days Post-Op  Subjective: CC-  Increased nausea yesterday, no emesis. Feels more bloated today but denies any current nausea. No flatus or BM. Ambulated in the hall 5 times yesterday.  Objective: Vital signs in last 24 hours: Temp:  [97.8 F (36.6 C)-98.2 F (36.8 C)] 98.2 F (36.8 C) (07/12 0520) Pulse Rate:  [103-110] 110 (07/12 0520) Resp:  [16-20] 20 (07/12 0520) BP: (133-145)/(82-95) 145/95 (07/12 0520) SpO2:  [99 %] 99 % (07/12 0520) Last BM Date : 10/04/21  Intake/Output from previous day: 07/11 0701 - 07/12 0700 In: 50 [P.O.:50] Out: 1000 [Urine:700; Drains:300] Intake/Output this shift: No intake/output data recorded.  PE: Gen:  Alert, NAD, pleasant Pulm:  Normal effort Abd: Soft, appropriately tender, abdominal distention with hypoactive BS, incisions c/d/I, drain serous Skin: warm and dry, no rashes  Psych: A&Ox3    Lab Results:  Recent Labs    10/05/21 0500 10/06/21 0546  HGB 11.5* 14.8  HCT 35.1* 45.8   BMET No results for input(s): "NA", "K", "CL", "CO2", "GLUCOSE", "BUN", "CREATININE", "CALCIUM" in the last 72 hours. PT/INR No results for input(s): "LABPROT", "INR" in the last 72 hours. CMP     Component Value Date/Time   NA 138 09/27/2021 1142   K 3.7 09/27/2021 1142   CL 111 09/27/2021 1142   CO2 20 (L) 09/27/2021 1142   GLUCOSE 98 09/27/2021 1142   BUN 21 09/27/2021 1142   CREATININE 1.05 09/27/2021 1142   CALCIUM 8.8 (L) 09/27/2021 1142   GFRNONAA >60 09/27/2021 1142   Lipase  No results found for: "LIPASE"     Studies/Results: DG Abd 1 View  Result Date: 10/06/2021 CLINICAL DATA:  66 year old male postoperative abdominal distension. Status post prostatectomy with dense intra-abdominal adhesions from previous laparotomy. Prostate carcinoma. EXAM: ABDOMEN - 1 VIEW COMPARISON:  CT Abdomen and Pelvis 02/15/2021.  FINDINGS: Portable AP supine views at 0627 hours. Pelvic drain and possible suprapubic catheter in place. Gas distended small and large bowel loops in the abdomen to the splenic flexure. Some gas in decompressed appearing distal large bowel. Gas is distension to the right of the spine might be in the retroperitoneal space, uncertain. No definite pneumoperitoneum on these supine views. No acute osseous abnormality identified. IMPRESSION: 1. Gas distended large and small bowel in the abdomen with decompressed distal colon. Favor postoperative ileus over a mechanical obstruction at this time. No definite pneumoperitoneum on these supine views. 2. Questionable retroperitoneal gas in the right abdomen. Pelvic drains and/or suprapubic catheter in place. Electronically Signed   By: Genevie Ann M.D.   On: 10/06/2021 07:15    Anti-infectives: Anti-infectives (From admission, onward)    Start     Dose/Rate Route Frequency Ordered Stop   10/04/21 2330  ceFAZolin (ANCEF) IVPB 1 g/50 mL premix        1 g 100 mL/hr over 30 Minutes Intravenous Every 8 hours 10/04/21 1737 10/05/21 1014   10/04/21 0922  ceFAZolin (ANCEF) IVPB 2g/100 mL premix        2 g 200 mL/hr over 30 Minutes Intravenous 30 min pre-op 10/04/21 0922 10/04/21 1530   10/04/21 0000  sulfamethoxazole-trimethoprim (BACTRIM DS) 800-160 MG tablet        1 tablet Oral 2 times daily 10/04/21 1048          Assessment/Plan POD#2 S/P Robotic assisted laparoscopic radical prostatectomy (bilateral nerve sparing) Dr.  Borden Bilateral robotic assisted laparoscopic pelvic lymphadenectomy Dr. Alinda Money Laparoscopic lysis of adesions, oversew of small bowel serosal injuries x2 Dr Brantley Stage    Persistent ileus. Xray today shows distended large and small bowel. Will make him NPO. If any worsening nausea or emesis will need NG tube, but ok to hold on this for now. Daily suppository. JP serous, continue to monitor  ID - ancef 7/10, bactrim 7/10>> FEN - IVF, NPO VTE  - SCDs, ok for chemical dvt ppx from surgical standpoint Foley - per urology   LOS: 1 day    Wellington Hampshire, [facility] 10/06/2021, 11:23 AM Please see Amion for pager number during day hours 7:00am-4:30pm

## 2021-10-06 NOTE — Progress Notes (Signed)
Rn spoke with on call NP Rosana Hoes, J concerning pt increased discomfort - feeling full. Abd round and more distended than start of shift. Hesitance to taking anything else beyond a small sip of water by mouth due to feeling full.  No gas/bm this shift.   Request for 1x dose of toradol - last dose was at 0124.    Orders given: toradol '15mg'$  IV x1                       KUB ABD - order will be placed by NP                Pt seen ambulating with spouse in hall and currently in restroom at this time

## 2021-10-06 NOTE — Progress Notes (Signed)
Patient ID: Ivan Lopez, male   DOB: 1955/07/13, 66 y.o.   MRN: [id number]  2 Days Post-Op Subjective: Pt with increased nausea and abdominal distention.  No vomiting.  No flatus.    Objective: Vital signs in last 24 hours: Temp:  [97.8 F (36.6 C)-98.2 F (36.8 C)] 98.2 F (36.8 C) (07/12 0520) Pulse Rate:  [103-110] 110 (07/12 0520) Resp:  [16-20] 20 (07/12 0520) BP: (133-145)/(82-95) 145/95 (07/12 0520) SpO2:  [99 %] 99 % (07/12 0520)  Intake/Output from previous day: 07/11 0701 - 07/12 0700 In: 50 [P.O.:50] Out: 1000 [Urine:700; Drains:300] Intake/Output this shift: Total I/O In: 50 [P.O.:50] Out: 550 [Urine:400; Drains:150]  Physical Exam:  General: Alert and oriented Abdomen: Soft, Moderate distention, No bowel sounds Incisions: C/D/I Ext: NT, No erythema  Lab Results: Recent Labs    10/04/21 1642 10/05/21 0500 10/06/21 0546  HGB 12.7* 11.5* 14.8  HCT 38.8* 35.1* 45.8     Assessment/Plan: POD # 2 s/p RALP/BPLND/laparoscopic adhesiolysis: Exam consistent with ileus.  Abdominal films pending.  Bowel rest for now.  Restart IVF. Continue ambulation, minimize narcotics.  Will start metoclopramide, dulcolax suppository.   LOS: 1 day   [name] 10/06/2021, 6:56 AM

## 2021-10-06 NOTE — Progress Notes (Addendum)
Pt reports increased discomfort. Abd is now round and more distended than start of shift.  On call urology service called, awaiting callback

## 2021-10-07 ENCOUNTER — Inpatient Hospital Stay (HOSPITAL_COMMUNITY): Payer: Medicare Other

## 2021-10-07 LAB — BASIC METABOLIC PANEL
Anion gap: 9 (ref 5–15)
BUN: 35 mg/dL — ABNORMAL HIGH (ref 8–23)
CO2: 24 mmol/L (ref 22–32)
Calcium: 8.7 mg/dL — ABNORMAL LOW (ref 8.9–10.3)
Chloride: 109 mmol/L (ref 98–111)
Creatinine, Ser: 1.31 mg/dL — ABNORMAL HIGH (ref 0.61–1.24)
GFR, Estimated: >60 mL/min (ref >60)
Glucose, Bld: 139 mg/dL — ABNORMAL HIGH (ref 70–99)
Potassium: 4.2 mmol/L (ref 3.5–5.1)
Sodium: 142 mmol/L (ref 135–145)

## 2021-10-07 MED ORDER — BISACODYL 10 MG RE SUPP
10.0000 mg | Freq: Once | RECTAL | Status: AC
  Administered 2021-10-07: 10 mg via RECTAL
  Filled 2021-10-07: qty 1

## 2021-10-07 NOTE — Progress Notes (Signed)
Pt has been complaining of nausea and hiccups during my shift. Pt had been given a dose of PRN zofran and scheduled reglan earlier in the shift.  Pt just now had 1 episode of yellow emesis. Another dose of zofran given.

## 2021-10-07 NOTE — Plan of Care (Signed)

## 2021-10-07 NOTE — Progress Notes (Addendum)
Patient ID: Ivan Lopez, male   DOB: 04/09/1955, 66 y.o.   MRN: [id number] [facility] Surgery Progress Note  3 Days Post-Op  Subjective: CC-  Increased nausea yesterday, no emesis. Feels more bloated today but denies any current nausea. No flatus or BM. Ambulated in the hall 5 times yesterday.  Objective: Vital signs in last 24 hours: Temp:  [98.4 F (36.9 C)-100 F (37.8 C)] 99.8 F (37.7 C) (07/13 0420) Pulse Rate:  [110-122] 116 (07/13 0420) Resp:  [15-20] 15 (07/13 0420) BP: (133-152)/(86-94) 133/88 (07/13 0420) SpO2:  [98 %-100 %] 98 % (07/13 0420) Last BM Date : 10/04/21  Intake/Output from previous day: 07/12 0701 - 07/13 0700 In: 1300.4 [I.V.:1300.4] Out: 350 [Urine:225; Emesis/NG output:125] Intake/Output this shift: Total I/O In: -  Out: 350 [Urine:300; Emesis/NG output:50]  PE: Gen:  Alert, NAD, pleasant Pulm:  Normal effort Abd: Soft, appropriately tender, abdominal distention with hypoactive BS, incisions c/d/I, drain serous Skin: warm and dry, no rashes  Psych: A&Ox3    Lab Results:  Recent Labs    10/05/21 0500 10/06/21 0546  HGB 11.5* 14.8  HCT 35.1* 45.8   BMET No results for input(s): "NA", "K", "CL", "CO2", "GLUCOSE", "BUN", "CREATININE", "CALCIUM" in the last 72 hours. PT/INR No results for input(s): "LABPROT", "INR" in the last 72 hours. CMP     Component Value Date/Time   NA 138 09/27/2021 1142   K 3.7 09/27/2021 1142   CL 111 09/27/2021 1142   CO2 20 (L) 09/27/2021 1142   GLUCOSE 98 09/27/2021 1142   BUN 21 09/27/2021 1142   CREATININE 1.05 09/27/2021 1142   CALCIUM 8.8 (L) 09/27/2021 1142   GFRNONAA >60 09/27/2021 1142   Lipase  No results found for: "LIPASE"     Studies/Results: DG Abd 1 View  Result Date: 10/06/2021 CLINICAL DATA:  66 year old male postoperative abdominal distension. Status post prostatectomy with dense intra-abdominal adhesions from previous laparotomy. Prostate carcinoma. EXAM: ABDOMEN - 1 VIEW  COMPARISON:  CT Abdomen and Pelvis 02/15/2021. FINDINGS: Portable AP supine views at 0627 hours. Pelvic drain and possible suprapubic catheter in place. Gas distended small and large bowel loops in the abdomen to the splenic flexure. Some gas in decompressed appearing distal large bowel. Gas is distension to the right of the spine might be in the retroperitoneal space, uncertain. No definite pneumoperitoneum on these supine views. No acute osseous abnormality identified. IMPRESSION: 1. Gas distended large and small bowel in the abdomen with decompressed distal colon. Favor postoperative ileus over a mechanical obstruction at this time. No definite pneumoperitoneum on these supine views. 2. Questionable retroperitoneal gas in the right abdomen. Pelvic drains and/or suprapubic catheter in place. Electronically Signed   By: Genevie Ann M.D.   On: 10/06/2021 07:15    Anti-infectives: Anti-infectives (From admission, onward)    Start     Dose/Rate Route Frequency Ordered Stop   10/04/21 2330  ceFAZolin (ANCEF) IVPB 1 g/50 mL premix        1 g 100 mL/hr over 30 Minutes Intravenous Every 8 hours 10/04/21 1737 10/05/21 1014   10/04/21 0922  ceFAZolin (ANCEF) IVPB 2g/100 mL premix        2 g 200 mL/hr over 30 Minutes Intravenous 30 min pre-op 10/04/21 0922 10/04/21 1530   10/04/21 0000  sulfamethoxazole-trimethoprim (BACTRIM DS) 800-160 MG tablet        1 tablet Oral 2 times daily 10/04/21 1048          Assessment/Plan POD#3 S/P Robotic  assisted laparoscopic radical prostatectomy (bilateral nerve sparing) Dr. Alinda Money Bilateral robotic assisted laparoscopic pelvic lymphadenectomy Dr. Alinda Money Laparoscopic lysis of adesions, oversew of small bowel serosal injuries x2 Dr Brantley Stage    Afebrile, sinus tachycardia Persistent ileus. Place NG tube to LIWS for decompression. Await bowel function. BMP pending   ID - ancef 7/10 FEN - IVF, NPO, may end up benefiting from TPN if he doesn't open up soon VTE - SCDs,  ok for chemical dvt ppx from surgical standpoint Foley - per urology   LOS: 2 days    Jill Alexanders, [facility] 10/07/2021, 9:54 AM Please see Amion for pager number during day hours 7:00am-4:30pm

## 2021-10-07 NOTE — Progress Notes (Signed)
Patient ID: Ivan Lopez, male   DOB: June 12, 1955, 66 y.o.   MRN: [id number]  3 Days Post-Op Subjective: Pt still with no flatus.  Nausea and vomiting overnight.  Objective: Vital signs in last 24 hours: Temp:  [98.4 F (36.9 C)-100 F (37.8 C)] 99.8 F (37.7 C) (07/13 0420) Pulse Rate:  [110-122] 116 (07/13 0420) Resp:  [15-20] 15 (07/13 0420) BP: (133-152)/(86-94) 133/88 (07/13 0420) SpO2:  [98 %-100 %] 98 % (07/13 0420)  Intake/Output from previous day: 07/12 0701 - 07/13 0700 In: 1300.4 [I.V.:1300.4] Out: 350 [Urine:225; Emesis/NG output:125] Intake/Output this shift: No intake/output data recorded.  Physical Exam:  General: Alert and oriented Abd: Distended, no BS, Incisions clean and dry  Lab Results: Recent Labs    10/04/21 1642 10/05/21 0500 10/06/21 0546  HGB 12.7* 11.5* 14.8  HCT 38.8* 35.1* 45.8   BMET   Studies/Results: DG Abd 1 View  Result Date: 10/06/2021 CLINICAL DATA:  66 year old male postoperative abdominal distension. Status post prostatectomy with dense intra-abdominal adhesions from previous laparotomy. Prostate carcinoma. EXAM: ABDOMEN - 1 VIEW COMPARISON:  CT Abdomen and Pelvis 02/15/2021. FINDINGS: Portable AP supine views at 0627 hours. Pelvic drain and possible suprapubic catheter in place. Gas distended small and large bowel loops in the abdomen to the splenic flexure. Some gas in decompressed appearing distal large bowel. Gas is distension to the right of the spine might be in the retroperitoneal space, uncertain. No definite pneumoperitoneum on these supine views. No acute osseous abnormality identified. IMPRESSION: 1. Gas distended large and small bowel in the abdomen with decompressed distal colon. Favor postoperative ileus over a mechanical obstruction at this time. No definite pneumoperitoneum on these supine views. 2. Questionable retroperitoneal gas in the right abdomen. Pelvic drains and/or suprapubic catheter in place. Electronically  Signed   By: Ivan Lopez M.D.   On: 10/06/2021 07:15    Assessment/Plan: POD # 3 s/p RALP/BPLND/laparoscopic adhesiolysis: Still with ileus.  Continue bowel rest, IVF hydration,  BMP pending this morning.   LOS: 2 days   Ivan Lopez 10/07/2021, 7:13 AM

## 2021-10-08 LAB — BASIC METABOLIC PANEL
Anion gap: 9 (ref 5–15)
BUN: 29 mg/dL — ABNORMAL HIGH (ref 8–23)
CO2: 27 mmol/L (ref 22–32)
Calcium: 8.3 mg/dL — ABNORMAL LOW (ref 8.9–10.3)
Chloride: 110 mmol/L (ref 98–111)
Creatinine, Ser: 1.23 mg/dL (ref 0.61–1.24)
GFR, Estimated: >60 mL/min (ref >60)
Glucose, Bld: 128 mg/dL — ABNORMAL HIGH (ref 70–99)
Potassium: 3.3 mmol/L — ABNORMAL LOW (ref 3.5–5.1)
Sodium: 146 mmol/L — ABNORMAL HIGH (ref 135–145)

## 2021-10-08 LAB — CBC
HCT: 38.2 % — ABNORMAL LOW (ref 39.0–52.0)
Hemoglobin: 12.4 g/dL — ABNORMAL LOW (ref 13.0–17.0)
MCH: 28.9 pg (ref 26.0–34.0)
MCHC: 32.5 g/dL (ref 30.0–36.0)
MCV: 89 fL (ref 80.0–100.0)
Platelets: 350 10*3/uL (ref 150–400)
RBC: 4.29 MIL/uL (ref 4.22–5.81)
RDW: 14.2 % (ref 11.5–15.5)
WBC: 17.3 10*3/uL — ABNORMAL HIGH (ref 4.0–10.5)
nRBC: 0.2 % (ref 0.0–0.2)

## 2021-10-08 LAB — PHOSPHORUS: Phosphorus: 2.8 mg/dL (ref 2.5–4.6)

## 2021-10-08 LAB — MAGNESIUM: Magnesium: 2.1 mg/dL (ref 1.7–2.4)

## 2021-10-08 LAB — SURGICAL PATHOLOGY

## 2021-10-08 MED ORDER — PHENOL 1.4 % MT LIQD
1.0000 | OROMUCOSAL | Status: DC | PRN
  Administered 2021-10-08 – 2021-10-25 (×6): 1 via OROMUCOSAL
  Filled 2021-10-08: qty 177

## 2021-10-08 MED ORDER — PANTOPRAZOLE SODIUM 40 MG PO TBEC
40.0000 mg | DELAYED_RELEASE_TABLET | Freq: Every day | ORAL | Status: DC
Start: 2021-10-08 — End: 2021-10-08

## 2021-10-08 MED ORDER — PANTOPRAZOLE SODIUM 40 MG IV SOLR
40.0000 mg | INTRAVENOUS | Status: DC
  Administered 2021-10-08 – 2021-11-03 (×27): 40 mg via INTRAVENOUS
  Filled 2021-10-08 (×27): qty 10

## 2021-10-08 MED ORDER — LACTATED RINGERS IV BOLUS
500.0000 mL | Freq: Once | INTRAVENOUS | Status: AC
  Administered 2021-10-08: 500 mL via INTRAVENOUS

## 2021-10-08 MED ORDER — CALCIUM CARBONATE ANTACID 500 MG PO CHEW
1.0000 | CHEWABLE_TABLET | Freq: Three times a day (TID) | ORAL | Status: DC | PRN
  Administered 2021-10-10 (×2): 200 mg via ORAL
  Filled 2021-10-08 (×2): qty 1

## 2021-10-08 MED ORDER — POTASSIUM CHLORIDE 10 MEQ/100ML IV SOLN
10.0000 meq | INTRAVENOUS | Status: AC
  Administered 2021-10-08 (×4): 10 meq via INTRAVENOUS
  Filled 2021-10-08 (×4): qty 100

## 2021-10-08 NOTE — Progress Notes (Signed)
Patient ID: Christepher Melchior, male   DOB: [birth year], 66 y.o.   MRN: [id number] [facility] Surgery Progress Note  4 Days Post-Op  Subjective: CC-  Up in chair. NG was clamped this morning. Denies worsening abdominal pain, n/v. Passing some flatus and had a small BM this morning. NG with 4.6L bilious drainage last 24 hours.  Objective: Vital signs in last 24 hours: Temp:  [97.9 F (36.6 C)-99.6 F (37.6 C)] 99 F (37.2 C) (07/14 0426) Pulse Rate:  [120-131] 121 (07/14 0426) Resp:  [18-22] 20 (07/14 0426) BP: (127-147)/(77-86) 134/82 (07/14 0426) SpO2:  [93 %-98 %] 93 % (07/14 0426) Last BM Date : 10/04/21  Intake/Output from previous day: 07/13 0701 - 07/14 0700 In: 2327.1 [I.V.:2327.1] Out: 5450 [Urine:850; Emesis/NG output:4600] Intake/Output this shift: Total I/O In: -  Out: 300 [Emesis/NG output:300]  PE: Gen:  Alert, NAD, pleasant Pulm:  Normal effort Abd: abdomen distended but soft, appropriately tender, hypoactive BS, incisions c/d/I Skin: warm and dry, no rashes  Psych: A&Ox3   Lab Results:  Recent Labs    10/06/21 0546  HGB 14.8  HCT 45.8   BMET Recent Labs    10/07/21 0833 10/08/21 0527  NA 142 146*  K 4.2 3.3*  CL 109 110  CO2 24 27  GLUCOSE 139* 128*  BUN 35* 29*  CREATININE 1.31* 1.23  CALCIUM 8.7* 8.3*   PT/INR No results for input(s): "LABPROT", "INR" in the last 72 hours. CMP     Component Value Date/Time   NA 146 (H) 10/08/2021 0527   K 3.3 (L) 10/08/2021 0527   CL 110 10/08/2021 0527   CO2 27 10/08/2021 0527   GLUCOSE 128 (H) 10/08/2021 0527   BUN 29 (H) 10/08/2021 0527   CREATININE 1.23 10/08/2021 0527   CALCIUM 8.3 (L) 10/08/2021 0527   GFRNONAA >60 10/08/2021 0527   Lipase  No results found for: "LIPASE"     Studies/Results: DG Abd Portable 1V  Result Date: 10/07/2021 CLINICAL DATA:  NG tube readjustment, patient postop EXAM: PORTABLE ABDOMEN - 1 VIEW COMPARISON:  Abdominal radiograph dated October 07, 2021  FINDINGS: NG tube and side port are in the stomach, tube is looped with tip in the proximal stomach. Pneumoperitoneum, likely postsurgical. Dilated gas-filled loops of bowel seen in the partially visualized abdomen. Atelectasis of the left lung base. IMPRESSION: 1. NG tube and side port are in the stomach, tube is looped with tip in the proximal stomach. 2. Pneumoperitoneum, likely postsurgical. 3. Dilated gas-filled loops of bowel seen in the partially visualized abdomen, likely due to ileus. Electronically Signed   By: Yetta Glassman M.D.   On: 10/07/2021 13:36   DG Abd Portable 1V  Result Date: 10/07/2021 CLINICAL DATA:  NG tube placement EXAM: PORTABLE ABDOMEN - 1 VIEW COMPARISON:  10/06/2021 FINDINGS: Limited radiograph of the lower chest and upper abdomen was obtained for the purposes of enteric tube localization. Enteric tube is seen coursing below the diaphragm with distal tip and side port coiled within the stomach. Gaseous distension of large and small bowel loops within the upper abdomen with multiple air-fluid levels. IMPRESSION: 1. Enteric tube coiled within the stomach. 2. Gaseous distension of large and small bowel loops within the upper abdomen with multiple air-fluid levels. Findings may be related to postoperative ileus. Obstruction not excluded. Continued radiographic surveillance is suggested. Electronically Signed   By: Davina Poke D.O.   On: 10/07/2021 11:17    Anti-infectives: Anti-infectives (From admission, onward)    Start  Dose/Rate Route Frequency Ordered Stop   10/04/21 2330  ceFAZolin (ANCEF) IVPB 1 g/50 mL premix        1 g 100 mL/hr over 30 Minutes Intravenous Every 8 hours 10/04/21 1737 10/05/21 1014   10/04/21 0922  ceFAZolin (ANCEF) IVPB 2g/100 mL premix        2 g 200 mL/hr over 30 Minutes Intravenous 30 min pre-op 10/04/21 0922 10/04/21 1530   10/04/21 0000  sulfamethoxazole-trimethoprim (BACTRIM DS) 800-160 MG tablet        1 tablet Oral 2 times daily  10/04/21 1048          Assessment/Plan POD#4 S/P Robotic assisted laparoscopic radical prostatectomy (bilateral nerve sparing) Dr. Alinda Money Bilateral robotic assisted laparoscopic pelvic lymphadenectomy Dr. Alinda Money Laparoscopic lysis of adesions, oversew of small bowel serosal injuries x2 Dr Brantley Stage    Having some return in bowel function. NG clamped this morning. Given such high NG output last 24 hours suspect it will not be ready to come out yet today Mobilize Remains tachycardic; no hypotension, hypoxia, or chest pain. Creatinine improving. Will place on cardiac monitoring. Check CBC.   ID - ancef 7/10 FEN - IVF, NPO VTE - SCDs, ok for chemical dvt ppx from surgical standpoint Foley - per urology    LOS: 3 days    Wellington Hampshire, [facility] Surgery 10/08/2021, 10:19 AM Please see Amion for pager number during day hours 7:00am-4:30pm

## 2021-10-08 NOTE — Progress Notes (Signed)
Patient ID: Ivan Lopez, male   DOB: 1956/01/28, 66 y.o.   MRN: [id number]  4 Days Post-Op Subjective: Pt passed flatus last night and has noted bowel sounds.  No nausea.  Objective: Vital signs in last 24 hours: Temp:  [97.9 F (36.6 C)-99.6 F (37.6 C)] 99 F (37.2 C) (07/14 0426) Pulse Rate:  [118-131] 121 (07/14 0426) Resp:  [18-22] 20 (07/14 0426) BP: (127-154)/(77-88) 134/82 (07/14 0426) SpO2:  [93 %-98 %] 93 % (07/14 0426)  Intake/Output from previous day: 07/13 0701 - 07/14 0700 In: 2327.1 [I.V.:2327.1] Out: 4850 [Urine:850; Emesis/NG output:4000] Intake/Output this shift: Total I/O In: 2327.1 [I.V.:2327.1] Out: 1750 [Urine:250; Emesis/NG output:1500]  Physical Exam:  General: Alert and oriented Abd: Soft, ND, NT, positive bowel sounds GU: Urine clear Inc: C/D/I  Lab Results: Recent Labs    10/06/21 0546  HGB 14.8  HCT 45.8   BMET Recent Labs    10/07/21 0833  NA 142  K 4.2  CL 109  CO2 24  GLUCOSE 139*  BUN 35*  CREATININE 1.31*  CALCIUM 8.7*     Studies/Results:  Assessment/Plan: POD # 4 s/p RALP/BPLND/laparoscopic adhesiolysis: Ileus appears to be resolving.  Labs pending this morning.  Clamp NG for trial.  Remain NPO and reassess later today.   LOS: 3 days   Ivan Lopez 10/08/2021, 6:58 AM

## 2021-10-09 LAB — BASIC METABOLIC PANEL WITH GFR
Anion gap: 5 (ref 5–15)
BUN: 35 mg/dL — ABNORMAL HIGH (ref 8–23)
CO2: 26 mmol/L (ref 22–32)
Calcium: 7.7 mg/dL — ABNORMAL LOW (ref 8.9–10.3)
Chloride: 115 mmol/L — ABNORMAL HIGH (ref 98–111)
Creatinine, Ser: 1.18 mg/dL (ref 0.61–1.24)
GFR, Estimated: >60 mL/min
Glucose, Bld: 105 mg/dL — ABNORMAL HIGH (ref 70–99)
Potassium: 3.8 mmol/L (ref 3.5–5.1)
Sodium: 146 mmol/L — ABNORMAL HIGH (ref 135–145)

## 2021-10-09 LAB — CBC
HCT: 32.3 % — ABNORMAL LOW (ref 39.0–52.0)
Hemoglobin: 10.3 g/dL — ABNORMAL LOW (ref 13.0–17.0)
MCH: 29.1 pg (ref 26.0–34.0)
MCHC: 31.9 g/dL (ref 30.0–36.0)
MCV: 91.2 fL (ref 80.0–100.0)
Platelets: 285 K/uL (ref 150–400)
RBC: 3.54 MIL/uL — ABNORMAL LOW (ref 4.22–5.81)
RDW: 14.2 % (ref 11.5–15.5)
WBC: 12.3 K/uL — ABNORMAL HIGH (ref 4.0–10.5)
nRBC: 0.2 % (ref 0.0–0.2)

## 2021-10-09 NOTE — Progress Notes (Signed)
5 Days Post-Op   Subjective/Chief Complaint: Passed a lot of flatus yesterday. Minimal nausea with NG clamped   Objective: Vital signs in last 24 hours: Temp:  [98.3 F (36.8 C)-100.4 F (38 C)] 98.3 F (36.8 C) (07/15 0349) Pulse Rate:  [101-117] 102 (07/15 0349) Resp:  [18-20] 18 (07/15 0349) BP: (126-143)/(78-85) 143/78 (07/15 0349) SpO2:  [96 %-99 %] 96 % (07/15 0349) Last BM Date : 10/08/21  Intake/Output from previous day: 07/14 0701 - 07/15 0700 In: 1380 [P.O.:180; I.V.:1200] Out: 920 [Urine:620; Emesis/NG output:300] Intake/Output this shift: No intake/output data recorded.  Exam: Awake and alert NG clamped, I hooked it back up and got minimal out Abdomen softer, minimally tender  Lab Results:  Recent Labs    10/08/21 1114 10/09/21 0514  WBC 17.3* 12.3*  HGB 12.4* 10.3*  HCT 38.2* 32.3*  PLT 350 285   BMET Recent Labs    10/08/21 0527 10/09/21 0514  NA 146* 146*  K 3.3* 3.8  CL 110 115*  CO2 27 26  GLUCOSE 128* 105*  BUN 29* 35*  CREATININE 1.23 1.18  CALCIUM 8.3* 7.7*   PT/INR No results for input(s): "LABPROT", "INR" in the last 72 hours. ABG No results for input(s): "PHART", "HCO3" in the last 72 hours.  Invalid input(s): "PCO2", "PO2"  Studies/Results: DG Abd Portable 1V  Result Date: 10/07/2021 CLINICAL DATA:  NG tube readjustment, patient postop EXAM: PORTABLE ABDOMEN - 1 VIEW COMPARISON:  Abdominal radiograph dated October 07, 2021 FINDINGS: NG tube and side port are in the stomach, tube is looped with tip in the proximal stomach. Pneumoperitoneum, likely postsurgical. Dilated gas-filled loops of bowel seen in the partially visualized abdomen. Atelectasis of the left lung base. IMPRESSION: 1. NG tube and side port are in the stomach, tube is looped with tip in the proximal stomach. 2. Pneumoperitoneum, likely postsurgical. 3. Dilated gas-filled loops of bowel seen in the partially visualized abdomen, likely due to ileus. Electronically  Signed   By: Yetta Glassman M.D.   On: 10/07/2021 13:36   DG Abd Portable 1V  Result Date: 10/07/2021 CLINICAL DATA:  NG tube placement EXAM: PORTABLE ABDOMEN - 1 VIEW COMPARISON:  10/06/2021 FINDINGS: Limited radiograph of the lower chest and upper abdomen was obtained for the purposes of enteric tube localization. Enteric tube is seen coursing below the diaphragm with distal tip and side port coiled within the stomach. Gaseous distension of large and small bowel loops within the upper abdomen with multiple air-fluid levels. IMPRESSION: 1. Enteric tube coiled within the stomach. 2. Gaseous distension of large and small bowel loops within the upper abdomen with multiple air-fluid levels. Findings may be related to postoperative ileus. Obstruction not excluded. Continued radiographic surveillance is suggested. Electronically Signed   By: Davina Poke D.O.   On: 10/07/2021 11:17    Anti-infectives: Anti-infectives (From admission, onward)    Start     Dose/Rate Route Frequency Ordered Stop   10/04/21 2330  ceFAZolin (ANCEF) IVPB 1 g/50 mL premix        1 g 100 mL/hr over 30 Minutes Intravenous Every 8 hours 10/04/21 1737 10/05/21 1014   10/04/21 0922  ceFAZolin (ANCEF) IVPB 2g/100 mL premix        2 g 200 mL/hr over 30 Minutes Intravenous 30 min pre-op 10/04/21 0922 10/04/21 1530   10/04/21 0000  sulfamethoxazole-trimethoprim (BACTRIM DS) 800-160 MG tablet        1 tablet Oral 2 times daily 10/04/21 1048  Assessment/Plan: POD#5 S/P Robotic assisted laparoscopic radical prostatectomy (bilateral nerve sparing) Dr. Alinda Money Bilateral robotic assisted laparoscopic pelvic lymphadenectomy Dr. Alinda Money Laparoscopic lysis of adesions, oversew of small bowel serosal injuries x2 Dr Brantley Stage   He is reluctant to have NG out given ileus which is improving.  I think this is reasonable.  If he has not issues with nausea, bloating by diner, it can be removed  Coralie Keens 10/09/2021

## 2021-10-09 NOTE — Progress Notes (Signed)
Patient became more distended & developed worsening abdominal pain- NGT was connected to ILWS at this time until further orders are placed.   Foley was also mildly leaking today & prior LLQ JP drain site has copious drainage as well. MD Manny notified of these findings in person. Adhesive pouch was placed to LLQ to maintain skin integrity & for proper measurement of drainage at site.  Patient ambulated in hall & room several times throughout day. Denies n/v.IVF's running per order. Patient on tele & clear liquid diet. Wife at bedside.

## 2021-10-09 NOTE — Plan of Care (Addendum)
A&Ox4, standby assist. NG tube to L nare, skin intact, secured to nose. Clamped this shift per MD instruction via page. LLQ drainage to adhesive pouch - serosanguinous.   Nausea x1 overnight, emesis (-), PRN Zofran x1 given - effective. No BM this shift. Meds taken crushed in liquid.    Problem: Education: Goal: Knowledge of General Education information will improve Description: Including pain rating scale, medication(s)/side effects and non-pharmacologic comfort measures Outcome: Progressing   Problem: Health Behavior/Discharge Planning: Goal: Ability to manage health-related needs will improve Outcome: Progressing   Problem: Clinical Measurements: Goal: Ability to maintain clinical measurements within normal limits will improve Outcome: Progressing Goal: Will remain free from infection Outcome: Progressing Goal: Diagnostic test results will improve Outcome: Progressing Goal: Respiratory complications will improve Outcome: Progressing Goal: Cardiovascular complication will be avoided Outcome: Progressing   Problem: Activity: Goal: Risk for activity intolerance will decrease Outcome: Progressing   Problem: Nutrition: Goal: Adequate nutrition will be maintained Outcome: Progressing   Problem: Coping: Goal: Level of anxiety will decrease Outcome: Progressing   Problem: Elimination: Goal: Will not experience complications related to bowel motility Outcome: Progressing Goal: Will not experience complications related to urinary retention Outcome: Progressing   Problem: Pain Managment: Goal: General experience of comfort will improve Outcome: Progressing   Problem: Safety: Goal: Ability to remain free from injury will improve Outcome: Progressing   Problem: Skin Integrity: Goal: Risk for impaired skin integrity will decrease Outcome: Progressing   Problem: Education: Goal: Knowledge of the procedure and recovery process will improve Outcome: Progressing    Problem: Bowel/Gastric: Goal: Gastrointestinal status for postoperative course will improve Outcome: Progressing   Problem: Pain Management: Goal: General experience of comfort will improve Outcome: Progressing   Problem: Skin Integrity: Goal: Demonstration of wound healing without infection will improve Outcome: Progressing   Problem: Urinary Elimination: Goal: Ability to avoid or minimize complications of infection will improve Outcome: Progressing Goal: Ability to achieve and maintain urine output will improve Outcome: Progressing Goal: Home care management will improve Outcome: Progressing

## 2021-10-09 NOTE — Progress Notes (Signed)
5 Days Post-Op   Subjective/Chief Complaint:  1 - Moderate Risk Prostate Cancer - s/p robotic prostatectomy / node dissection 10/05/22 for pT2N0 prostate cancer with negative margins.  2 - Ileus / Adhesions - required extensive adhesiolysis as part of prostatectomy 7/10. T. Cornett gen surg assisting.  Some post-op ileus as expected initially managed with NGT. REturn of flatus / BM 7/14 and NG clamped. Minimal NG residual 7/15 and starting on clears.  Today " Starling " is progressing. NO emesis with NG clamping trial Some mild tachycardia that is stable. Hgb 12s and Cr stable.   Objective: Vital signs in last 24 hours: Temp:  [98.3 F (36.8 C)-100.4 F (38 C)] 98.3 F (36.8 C) (07/15 0349) Pulse Rate:  [101-117] 102 (07/15 0349) Resp:  [18-20] 18 (07/15 0349) BP: (126-143)/(78-85) 143/78 (07/15 0349) SpO2:  [96 %-99 %] 96 % (07/15 0349) Last BM Date : 10/08/21  Intake/Output from previous day: 07/14 0701 - 07/15 0700 In: 1380 [P.O.:180; I.V.:1200] Out: 920 [Urine:620; Emesis/NG output:300] Intake/Output this shift: No intake/output data recorded.  Very pleasant, family at bedisde NGT in place, capped Non-labored breathing or RA HR low 100s Very mild abd distension, no r/g, non-typamic Recent port sites c/d/I JP site with dry dressing No c/c/e.  Lab Results:  Recent Labs    10/08/21 1114 10/09/21 0514  WBC 17.3* 12.3*  HGB 12.4* 10.3*  HCT 38.2* 32.3*  PLT 350 285   BMET Recent Labs    10/08/21 0527 10/09/21 0514  NA 146* 146*  K 3.3* 3.8  CL 110 115*  CO2 27 26  GLUCOSE 128* 105*  BUN 29* 35*  CREATININE 1.23 1.18  CALCIUM 8.3* 7.7*   PT/INR No results for input(s): "LABPROT", "INR" in the last 72 hours. ABG No results for input(s): "PHART", "HCO3" in the last 72 hours.  Invalid input(s): "PCO2", "PO2"  Studies/Results: DG Abd Portable 1V  Result Date: 10/07/2021 CLINICAL DATA:  NG tube readjustment, patient postop EXAM: PORTABLE ABDOMEN - 1 VIEW  COMPARISON:  Abdominal radiograph dated October 07, 2021 FINDINGS: NG tube and side port are in the stomach, tube is looped with tip in the proximal stomach. Pneumoperitoneum, likely postsurgical. Dilated gas-filled loops of bowel seen in the partially visualized abdomen. Atelectasis of the left lung base. IMPRESSION: 1. NG tube and side port are in the stomach, tube is looped with tip in the proximal stomach. 2. Pneumoperitoneum, likely postsurgical. 3. Dilated gas-filled loops of bowel seen in the partially visualized abdomen, likely due to ileus. Electronically Signed   By: Yetta Glassman M.D.   On: 10/07/2021 13:36   DG Abd Portable 1V  Result Date: 10/07/2021 CLINICAL DATA:  NG tube placement EXAM: PORTABLE ABDOMEN - 1 VIEW COMPARISON:  10/06/2021 FINDINGS: Limited radiograph of the lower chest and upper abdomen was obtained for the purposes of enteric tube localization. Enteric tube is seen coursing below the diaphragm with distal tip and side port coiled within the stomach. Gaseous distension of large and small bowel loops within the upper abdomen with multiple air-fluid levels. IMPRESSION: 1. Enteric tube coiled within the stomach. 2. Gaseous distension of large and small bowel loops within the upper abdomen with multiple air-fluid levels. Findings may be related to postoperative ileus. Obstruction not excluded. Continued radiographic surveillance is suggested. Electronically Signed   By: Davina Poke D.O.   On: 10/07/2021 11:17    Anti-infectives: Anti-infectives (From admission, onward)    Start     Dose/Rate Route Frequency Ordered Stop  10/04/21 2330  ceFAZolin (ANCEF) IVPB 1 g/50 mL premix        1 g 100 mL/hr over 30 Minutes Intravenous Every 8 hours 10/04/21 1737 10/05/21 1014   10/04/21 0922  ceFAZolin (ANCEF) IVPB 2g/100 mL premix        2 g 200 mL/hr over 30 Minutes Intravenous 30 min pre-op 10/04/21 0922 10/04/21 1530   10/04/21 0000  sulfamethoxazole-trimethoprim (BACTRIM DS)  800-160 MG tablet        1 tablet Oral 2 times daily 10/04/21 1048         Assessment/Plan:  Making progress POD 5. Agree with keep NGT but clamped with plan to remove this PM or tomorrow AM. Begin clears. Keep current GU driains. Appreciate gen surg team comanagement. Encouraged ambulation and he is doing very well in this regard.    Alexis Frock 10/09/2021

## 2021-10-10 ENCOUNTER — Inpatient Hospital Stay: Payer: Self-pay

## 2021-10-10 ENCOUNTER — Inpatient Hospital Stay (HOSPITAL_COMMUNITY): Payer: Medicare Other

## 2021-10-10 LAB — BASIC METABOLIC PANEL
Anion gap: 7 (ref 5–15)
BUN: 29 mg/dL — ABNORMAL HIGH (ref 8–23)
CO2: 24 mmol/L (ref 22–32)
Calcium: 8.1 mg/dL — ABNORMAL LOW (ref 8.9–10.3)
Chloride: 113 mmol/L — ABNORMAL HIGH (ref 98–111)
Creatinine, Ser: 1.21 mg/dL (ref 0.61–1.24)
GFR, Estimated: >60 mL/min (ref >60)
Glucose, Bld: 111 mg/dL — ABNORMAL HIGH (ref 70–99)
Potassium: 4.3 mmol/L (ref 3.5–5.1)
Sodium: 144 mmol/L (ref 135–145)

## 2021-10-10 LAB — CBC
HCT: 32.6 % — ABNORMAL LOW (ref 39.0–52.0)
Hemoglobin: 10.5 g/dL — ABNORMAL LOW (ref 13.0–17.0)
MCH: 29.3 pg (ref 26.0–34.0)
MCHC: 32.2 g/dL (ref 30.0–36.0)
MCV: 91.1 fL (ref 80.0–100.0)
Platelets: 309 10*3/uL (ref 150–400)
RBC: 3.58 MIL/uL — ABNORMAL LOW (ref 4.22–5.81)
RDW: 14.2 % (ref 11.5–15.5)
WBC: 8.1 10*3/uL (ref 4.0–10.5)
nRBC: 0.2 % (ref 0.0–0.2)

## 2021-10-10 MED ORDER — IOHEXOL 300 MG/ML  SOLN
100.0000 mL | Freq: Once | INTRAMUSCULAR | Status: AC | PRN
  Administered 2021-10-10: 100 mL via INTRAVENOUS

## 2021-10-10 MED ORDER — SODIUM CHLORIDE (PF) 0.9 % IJ SOLN
INTRAMUSCULAR | Status: AC
  Filled 2021-10-10: qty 50

## 2021-10-10 MED ORDER — IOHEXOL 9 MG/ML PO SOLN
ORAL | Status: AC
  Filled 2021-10-10: qty 1000

## 2021-10-10 MED ORDER — METOCLOPRAMIDE HCL 5 MG/ML IJ SOLN
10.0000 mg | Freq: Four times a day (QID) | INTRAMUSCULAR | Status: DC
Start: 2021-10-10 — End: 2021-11-04
  Administered 2021-10-10 – 2021-11-04 (×98): 10 mg via INTRAVENOUS
  Filled 2021-10-10 (×97): qty 2

## 2021-10-10 NOTE — Progress Notes (Signed)
6 Days Post-Op   Subjective/Chief Complaint: Had one episode of emesis with NG clamped Complains of burning epigastric pain He is passing flatus   Objective: Vital signs in last 24 hours: Temp:  [98.2 F (36.8 C)-99.2 F (37.3 C)] 98.9 F (37.2 C) (07/16 0818) Pulse Rate:  [103-113] 103 (07/16 0818) Resp:  [14-20] 16 (07/16 0818) BP: (137-157)/(71-89) 137/81 (07/16 0818) SpO2:  [94 %-100 %] 97 % (07/16 0818) Last BM Date :  (per pt the "BM' from yesterday was more like mucus)  Intake/Output from previous day: 07/15 0701 - 07/16 0700 In: 2335.1 [P.O.:400; I.V.:1935.1] Out: 980 [Urine:800; Emesis/NG output:100] Intake/Output this shift: No intake/output data recorded.  Exam: Awake and alert Abdomen is soft today and non-distended  Lab Results:  Recent Labs    10/09/21 0514 10/10/21 0430  WBC 12.3* 8.1  HGB 10.3* 10.5*  HCT 32.3* 32.6*  PLT 285 309   BMET Recent Labs    10/09/21 0514 10/10/21 0430  NA 146* 144  K 3.8 4.3  CL 115* 113*  CO2 26 24  GLUCOSE 105* 111*  BUN 35* 29*  CREATININE 1.18 1.21  CALCIUM 7.7* 8.1*   PT/INR No results for input(s): "LABPROT", "INR" in the last 72 hours. ABG No results for input(s): "PHART", "HCO3" in the last 72 hours.  Invalid input(s): "PCO2", "PO2"  Studies/Results: No results found.  Anti-infectives: Anti-infectives (From admission, onward)    Start     Dose/Rate Route Frequency Ordered Stop   10/04/21 2330  ceFAZolin (ANCEF) IVPB 1 g/50 mL premix        1 g 100 mL/hr over 30 Minutes Intravenous Every 8 hours 10/04/21 1737 10/05/21 1014   10/04/21 0922  ceFAZolin (ANCEF) IVPB 2g/100 mL premix        2 g 200 mL/hr over 30 Minutes Intravenous 30 min pre-op 10/04/21 8889 10/04/21 1530   10/04/21 0000  sulfamethoxazole-trimethoprim (BACTRIM DS) 800-160 MG tablet        1 tablet Oral 2 times daily 10/04/21 1048         Assessment/Plan: POD#6 S/P Robotic assisted laparoscopic radical prostatectomy  (bilateral nerve sparing) Dr. Alinda Money Bilateral robotic assisted laparoscopic pelvic lymphadenectomy Dr. Alinda Money Laparoscopic lysis of adesions, oversew of small bowel serosal injuries x2 Dr Brantley Stage   Ileus Given his symptoms, we will check an abdominal xray to evaluate the NG and bowel gas pattern. He is already on reglan, tums, and protonix May have to consider TNA if he does not open up   Coralie Keens MD 10/10/2021

## 2021-10-10 NOTE — Progress Notes (Signed)
6 Days Post-Op   Subjective/Chief Complaint:  1 - Moderate Risk Prostate Cancer - s/p robotic prostatectomy / node dissection 10/05/22 for pT2N0 prostate cancer with negative margins.  2 - Ileus / Adhesions - required extensive adhesiolysis as part of prostatectomy 7/10. T. Cornett gen surg assisting.  Some post-op ileus as expected initially managed with NGT. REturn of flatus / BM 7/14 and NG clamped. Minimal NG residual 7/15 and started on clears but emesis returned and NGT placed back on suction later 7/15.   3 - Nutrition - pt with prolonged ilieus and s/p major surgery. Path to TPN started 7/16.   Today " Myles " is regressing again. Some abd distention, higher/bilious NGT output, and some new hiccups.    Objective: Vital signs in last 24 hours: Temp:  [98.2 F (36.8 C)-99.2 F (37.3 C)] 98.9 F (37.2 C) (07/16 0818) Pulse Rate:  [103-113] 103 (07/16 0818) Resp:  [14-20] 16 (07/16 0818) BP: (137-157)/(71-89) 137/81 (07/16 0818) SpO2:  [94 %-100 %] 97 % (07/16 0818) Last BM Date :  (per pt the "BM' from yesterday was more like mucus)  Intake/Output from previous day: 07/15 0701 - 07/16 0700 In: 2335.1 [P.O.:400; I.V.:1935.1] Out: 980 [Urine:800; Emesis/NG output:100] Intake/Output this shift: No intake/output data recorded.   Very pleasant.  NGT in place, bilious output in cannister on LWS Non-labored breathing or RA. Few hiccups.  HR low 100s Increased / moderate abd distension, no r/g, non-typamic Recent port sites c/d/I JP site with dry dressing No c/c/e.  Lab Results:  Recent Labs    10/09/21 0514 10/10/21 0430  WBC 12.3* 8.1  HGB 10.3* 10.5*  HCT 32.3* 32.6*  PLT 285 309   BMET Recent Labs    10/09/21 0514 10/10/21 0430  NA 146* 144  K 3.8 4.3  CL 115* 113*  CO2 26 24  GLUCOSE 105* 111*  BUN 35* 29*  CREATININE 1.18 1.21  CALCIUM 7.7* 8.1*   PT/INR No results for input(s): "LABPROT", "INR" in the last 72 hours. ABG No results for  input(s): "PHART", "HCO3" in the last 72 hours.  Invalid input(s): "PCO2", "PO2"  Studies/Results: No results found.  Anti-infectives: Anti-infectives (From admission, onward)    Start     Dose/Rate Route Frequency Ordered Stop   10/04/21 2330  ceFAZolin (ANCEF) IVPB 1 g/50 mL premix        1 g 100 mL/hr over 30 Minutes Intravenous Every 8 hours 10/04/21 1737 10/05/21 1014   10/04/21 0922  ceFAZolin (ANCEF) IVPB 2g/100 mL premix        2 g 200 mL/hr over 30 Minutes Intravenous 30 min pre-op 10/04/21 0922 10/04/21 1530   10/04/21 0000  sulfamethoxazole-trimethoprim (BACTRIM DS) 800-160 MG tablet        1 tablet Oral 2 times daily 10/04/21 1048         Assessment/Plan:  1 - Moderate Risk Prostate Cancer - great prognosis, no further cancer directed care this admission.   2 - Ileus / Adhesions - appreciate gen surg comanagement. Will proceed with IV + GI contrast CT today to r/o fluid collections, frank obstruction or leaks.   3 - Nutrition - PICC and TPN ordered, hopefully can start tomorrow. Pt in agreement and understands plan.   Alexis Frock 10/10/2021

## 2021-10-10 NOTE — Progress Notes (Signed)
24m serosanguineous fluid emptied from fecal pouch from L abdomen incision.

## 2021-10-10 NOTE — Progress Notes (Signed)
Pharmacy Brief Note -   Pharmacy consulted to manage TPN. Since consult placed after the noon deadline, will plan to initiate TPN on 7/17 @ 1800 pending PICC placement.   Labs ordered for tomorrow morning.   Lenis Noon, PharmD 10/10/21 3:33 PM

## 2021-10-11 ENCOUNTER — Inpatient Hospital Stay: Payer: Self-pay

## 2021-10-11 LAB — COMPREHENSIVE METABOLIC PANEL
ALT: 30 U/L (ref 0–44)
AST: 22 U/L (ref 15–41)
Albumin: 2.2 g/dL — ABNORMAL LOW (ref 3.5–5.0)
Alkaline Phosphatase: 64 U/L (ref 38–126)
Anion gap: 10 (ref 5–15)
BUN: 30 mg/dL — ABNORMAL HIGH (ref 8–23)
CO2: 21 mmol/L — ABNORMAL LOW (ref 22–32)
Calcium: 8.2 mg/dL — ABNORMAL LOW (ref 8.9–10.3)
Chloride: 111 mmol/L (ref 98–111)
Creatinine, Ser: 1.12 mg/dL (ref 0.61–1.24)
GFR, Estimated: >60 mL/min (ref >60)
Glucose, Bld: 83 mg/dL (ref 70–99)
Potassium: 3.7 mmol/L (ref 3.5–5.1)
Sodium: 142 mmol/L (ref 135–145)
Total Bilirubin: 1.1 mg/dL (ref 0.3–1.2)
Total Protein: 6.1 g/dL — ABNORMAL LOW (ref 6.5–8.1)

## 2021-10-11 LAB — CBC
HCT: 33.2 % — ABNORMAL LOW (ref 39.0–52.0)
Hemoglobin: 10.4 g/dL — ABNORMAL LOW (ref 13.0–17.0)
MCH: 28.9 pg (ref 26.0–34.0)
MCHC: 31.3 g/dL (ref 30.0–36.0)
MCV: 92.2 fL (ref 80.0–100.0)
Platelets: 349 10*3/uL (ref 150–400)
RBC: 3.6 MIL/uL — ABNORMAL LOW (ref 4.22–5.81)
RDW: 14 % (ref 11.5–15.5)
WBC: 10.3 10*3/uL (ref 4.0–10.5)
nRBC: 0.2 % (ref 0.0–0.2)

## 2021-10-11 LAB — MAGNESIUM: Magnesium: 2.2 mg/dL (ref 1.7–2.4)

## 2021-10-11 LAB — GLUCOSE, CAPILLARY
Glucose-Capillary: 121 mg/dL — ABNORMAL HIGH (ref 70–99)
Glucose-Capillary: 122 mg/dL — ABNORMAL HIGH (ref 70–99)

## 2021-10-11 LAB — PHOSPHORUS: Phosphorus: 3.9 mg/dL (ref 2.5–4.6)

## 2021-10-11 LAB — TRIGLYCERIDES: Triglycerides: 67 mg/dL (ref <150)

## 2021-10-11 MED ORDER — SODIUM CHLORIDE 0.9 % IV SOLN
12.5000 mg | Freq: Once | INTRAVENOUS | Status: AC
  Administered 2021-10-11: 12.5 mg via INTRAVENOUS
  Filled 2021-10-11: qty 0.5

## 2021-10-11 MED ORDER — DIPHENHYDRAMINE HCL 50 MG/ML IJ SOLN
25.0000 mg | Freq: Every evening | INTRAMUSCULAR | Status: DC | PRN
  Administered 2021-10-11 – 2021-11-03 (×12): 25 mg via INTRAVENOUS
  Filled 2021-10-11 (×12): qty 1

## 2021-10-11 MED ORDER — HEPARIN SODIUM (PORCINE) 5000 UNIT/ML IJ SOLN
5000.0000 [IU] | Freq: Three times a day (TID) | INTRAMUSCULAR | Status: DC
  Administered 2021-10-11 – 2021-10-26 (×46): 5000 [IU] via SUBCUTANEOUS
  Filled 2021-10-11 (×47): qty 1

## 2021-10-11 MED ORDER — INSULIN ASPART 100 UNIT/ML IJ SOLN
0.0000 [IU] | Freq: Three times a day (TID) | INTRAMUSCULAR | Status: DC
  Administered 2021-10-12 (×2): 1 [IU] via SUBCUTANEOUS
  Administered 2021-10-13: 2 [IU] via SUBCUTANEOUS
  Administered 2021-10-14 – 2021-10-17 (×11): 1 [IU] via SUBCUTANEOUS
  Administered 2021-10-17: 2 [IU] via SUBCUTANEOUS
  Administered 2021-10-18 – 2021-10-19 (×3): 1 [IU] via SUBCUTANEOUS
  Administered 2021-10-20 – 2021-10-22 (×7): 2 [IU] via SUBCUTANEOUS
  Administered 2021-10-22 – 2021-10-23 (×4): 1 [IU] via SUBCUTANEOUS
  Administered 2021-10-24: 2 [IU] via SUBCUTANEOUS
  Administered 2021-10-24 – 2021-10-25 (×4): 1 [IU] via SUBCUTANEOUS
  Administered 2021-10-25 – 2021-10-26 (×3): 2 [IU] via SUBCUTANEOUS
  Administered 2021-10-26: 1 [IU] via SUBCUTANEOUS
  Administered 2021-10-27 (×3): 2 [IU] via SUBCUTANEOUS
  Administered 2021-10-28 (×2): 1 [IU] via SUBCUTANEOUS
  Administered 2021-10-28: 2 [IU] via SUBCUTANEOUS
  Administered 2021-10-29 – 2021-11-01 (×10): 1 [IU] via SUBCUTANEOUS
  Administered 2021-11-01: 2 [IU] via SUBCUTANEOUS
  Administered 2021-11-01 – 2021-11-03 (×5): 1 [IU] via SUBCUTANEOUS
  Administered 2021-11-03: 2 [IU] via SUBCUTANEOUS
  Administered 2021-11-03: 1 [IU] via SUBCUTANEOUS

## 2021-10-11 MED ORDER — SODIUM CHLORIDE 0.9 % IV SOLN: INTRAVENOUS | Status: AC

## 2021-10-11 MED ORDER — SODIUM CHLORIDE 0.9% FLUSH
10.0000 mL | INTRAVENOUS | Status: DC | PRN
  Administered 2021-10-16 – 2021-11-03 (×6): 10 mL

## 2021-10-11 MED ORDER — MORPHINE SULFATE (PF) 2 MG/ML IV SOLN
2.0000 mg | INTRAVENOUS | Status: DC | PRN
  Administered 2021-10-19 – 2021-10-20 (×4): 2 mg via INTRAVENOUS
  Filled 2021-10-11 (×4): qty 1

## 2021-10-11 MED ORDER — BOOST / RESOURCE BREEZE PO LIQD CUSTOM: 1.0000 | Freq: Two times a day (BID) | ORAL | Status: DC

## 2021-10-11 MED ORDER — SODIUM CHLORIDE 0.9 % IV SOLN: 12.5000 mg | Freq: Three times a day (TID) | INTRAVENOUS | Status: DC | PRN

## 2021-10-11 MED ORDER — TRAVASOL 10 % IV SOLN
INTRAVENOUS | Status: AC
  Filled 2021-10-11: qty 480

## 2021-10-11 MED ORDER — ACETAMINOPHEN 10 MG/ML IV SOLN
1000.0000 mg | Freq: Four times a day (QID) | INTRAVENOUS | Status: AC
  Administered 2021-10-11 – 2021-10-12 (×4): 1000 mg via INTRAVENOUS
  Filled 2021-10-11 (×4): qty 100

## 2021-10-11 NOTE — Progress Notes (Addendum)
7 Days Post-Op   Subjective/Chief Complaint:  1 - Moderate Risk Prostate Cancer - s/p robotic prostatectomy / node dissection 10/05/22 for pT2N0 prostate cancer with negative margins.  2 - Ileus / Adhesions - required extensive adhesiolysis as part of prostatectomy 7/10. [facility] gen surg assisting.  Some post-op ileus as expected initially managed with NGT. Initial return of bowel function followed by period of nausea/vomiting and NG put back to suction.   3 - Nutrition - pt with prolonged ilieus and s/p major surgery. Path to TPN started 7/16.   Bowel movement x1 although >1L NG output. CT yesterday without abscess or fluid collection but concern for small bowel obstruction.    Objective: Vital signs in last 24 hours: Temp:  [98.6 F (37 C)-99.2 F (37.3 C)] 99.2 F (37.3 C) (07/17 0344) Pulse Rate:  [98-108] 108 (07/17 0344) Resp:  [14-18] 14 (07/17 0344) BP: (114-143)/(68-87) 114/68 (07/17 0344) SpO2:  [97 %-99 %] 98 % (07/17 0344) Last BM Date :  (per pt the "BM' from yesterday was more like mucus)  Intake/Output from previous day: 07/16 0701 - 07/17 0700 In: 933.9 [I.V.:933.9] Out: 1550 [Urine:450; Emesis/NG output:1100] Intake/Output this shift: No intake/output data recorded.   Very pleasant.  NGT in place, bilious output in cannister on LWS Non-labored breathing or RA. Few hiccups.  HR low 100s Increased / moderate abd distension, no r/g, non-typamic Recent port sites c/d/I JP site with dry dressing No c/c/e.  Lab Results:  Recent Labs    10/10/21 0430 10/11/21 0449  WBC 8.1 10.3  HGB 10.5* 10.4*  HCT 32.6* 33.2*  PLT 309 349    BMET Recent Labs    10/10/21 0430 10/11/21 0449  NA 144 142  K 4.3 3.7  CL 113* 111  CO2 24 21*  GLUCOSE 111* 83  BUN 29* 30*  CREATININE 1.21 1.12  CALCIUM 8.1* 8.2*    PT/INR No results for input(s): "LABPROT", "INR" in the last 72 hours. ABG No results for input(s): "PHART", "HCO3" in the last 72  hours.  Invalid input(s): "PCO2", "PO2"  Studies/Results: CT ABDOMEN PELVIS W CONTRAST  Result Date: 10/10/2021 CLINICAL DATA:  Bowel obstruction, prostate cancer status post prostatectomy and adhesiolysis EXAM: CT ABDOMEN AND PELVIS WITH CONTRAST TECHNIQUE: Multidetector CT imaging of the abdomen and pelvis was performed using the standard protocol following bolus administration of intravenous contrast. RADIATION DOSE REDUCTION: This exam was performed according to the departmental dose-optimization program which includes automated exposure control, adjustment of the mA and/or kV according to patient size and/or use of iterative reconstruction technique. CONTRAST:  193m OMNIPAQUE IOHEXOL 300 MG/ML  SOLN COMPARISON:  02/15/2021 FINDINGS: Lower chest: Trace right pleural effusion with associated right basilar atelectasis. Nasogastric tube is seen extending into the gastric fundus. Cardiac size within normal limits. Hepatobiliary: Simple cyst within the central right hepatic lobe is unchanged. Liver otherwise unremarkable. Cholelithiasis without pericholecystic inflammatory change noted. No intra or extrahepatic biliary ductal dilation. Pancreas: Unremarkable Spleen: Unremarkable Adrenals/Urinary Tract: The adrenal glands are unremarkable. The kidneys are normal. The bladder is decompressed with a Foley catheter balloon seen within its lumen. Stomach/Bowel: A mid small bowel obstruction is present with a single point of transition identified within the hypogastric region best seen on axial image # 64/2. The proximal small bowel is dilated in fluid-filled. Distally, the small bowel is decompressed small free intraperitoneal gas is present in keeping with given history of recent intra-abdominal surgery. The large bowel is unremarkable. Appendectomy has been performed.  Trace free fluid within the pelvis anteriorly. Vascular/Lymphatic: Aortic atherosclerosis. No enlarged abdominal or pelvic lymph nodes.  Reproductive: Surgical changes of prostatectomy are identified with small amount of gas and fluid within the prostatectomy bed. Other: There is mild circumferential thickening of the a distal rectum which may reflect changes of mild proctitis. No evidence of obstruction or perforation. Mild perirectal edema is nonspecific and may be inflammatory or postsurgical in nature. Musculoskeletal: No acute bone abnormality. No lytic or blastic bone lesion. IMPRESSION: 1. Mid small bowel obstruction with a single point of transition identified within the hypogastric region. Mild free intraperitoneal gas in keeping with given history of recent intra-abdominal surgery. Trace ascites. 2. Surgical changes of prostatectomy with small amount of gas and fluid within the prostatectomy bed. 3. Mild circumferential thickening of the distal rectum which may reflect changes of mild proctitis. No evidence of obstruction or perforation. 4. Cholelithiasis. 5. Trace right pleural effusion with associated right basilar atelectasis. Aortic Atherosclerosis (ICD10-I70.0). Electronically Signed   By: Fidela Salisbury M.D.   On: 10/10/2021 20:45   [place] EKG SITE RITE  Result Date: 10/10/2021 If Site Rite image not attached, placement could not be confirmed due to current cardiac rhythm.  DG Abd Portable 1V  Result Date: 10/10/2021 CLINICAL DATA:  Ileus EXAM: PORTABLE ABDOMEN - 1 VIEW COMPARISON:  One-view abdomen 10/07/2021 FINDINGS: Dilated loops of small bowel have increased in caliber since the prior exam. No definite free air is present on the supine images. Rectal tube is in place. No other enteric tube is visualized on this study. IMPRESSION: 1. Increasing caliber of dilated loops of small bowel compatible with obstruction or ileus. 2. Rectal tube in place. No other enteric tube is visualized. Electronically Signed   By: San Morelle M.D.   On: 10/10/2021 13:58    Anti-infectives: Anti-infectives (From admission, onward)     Start     Dose/Rate Route Frequency Ordered Stop   10/04/21 2330  ceFAZolin (ANCEF) IVPB 1 g/50 mL premix        1 g 100 mL/hr over 30 Minutes Intravenous Every 8 hours 10/04/21 1737 10/05/21 1014   10/04/21 0922  ceFAZolin (ANCEF) IVPB 2g/100 mL premix        2 g 200 mL/hr over 30 Minutes Intravenous 30 min pre-op 10/04/21 0922 10/04/21 1530   10/04/21 0000  sulfamethoxazole-trimethoprim (BACTRIM DS) 800-160 MG tablet        1 tablet Oral 2 times daily 10/04/21 1048         Assessment/Plan:  1 - Moderate Risk Prostate Cancer - great prognosis, no further cancer directed care this admission.   2 - Ileus / Adhesions - appreciate gen surg comanagement. Will touch base with them regarding CT findings and further recommendations  3 - Nutrition - PICC and TPN ordered, planned to start today. Pt in agreement and understands plan.   [name] 10/11/2021

## 2021-10-11 NOTE — Progress Notes (Signed)
Initial Nutrition Assessment  DOCUMENTATION CODES:   Not applicable  INTERVENTION:  - TPN initiation and advancement per Pharmacist.  - will order Boost Breeze BID, each supplement provides 250 kcal and 9 grams of protein  - diet advancement as medically feasible.   - RN to weigh patient today, discussed at bedside.    NUTRITION DIAGNOSIS:   Inadequate oral intake related to acute illness as evidenced by other (comment) (CLD and need for NGT to LIS).  GOAL:   Patient will meet greater than or equal to 90% of their needs  MONITOR:   PO intake, Supplement acceptance, Diet advancement, Labs, Weight trends, Other (Comment) (TPN regimen)  REASON FOR ASSESSMENT:   Consult New TPN/TNA  ASSESSMENT:   66 year-old male with medical history. On 7/10 he underwent robotic-assisted lap radical prostatectomy with pelvic lymphadenectomy. Medical course complicated by ileus with need for NGT placement for decompression and TPN initiation.  Patient sitting up in the chair with his daughter at bedside. NGT in place in L nare on 7/13 with abdominal x-ray on that date indicating tip and side port in the stomach.   Patient is POD #7. Diet advanced from NPO to [place] on 7/11, changed back to NPO on 7/12, and advanced to CLD on 7/15. No meaningful intakes since that time.  Patient reports feeling ok today, no abdominal pain, pressure, cramping, or nausea during RD visit. Patient is waiting to see the doctor to hear more about plan for today. He and daughter are aware of likely plan for PICC and initiation of TPN tonight. Order currently in place for TPN at 40 ml/hr.   Patient weighs himself every few days at home and UBW is 160 lb. Weight on admission recorded as both 158 lb and 164 lb. Patient has not been weighed since admission.    Labs reviewed; BUN: 30 mg/dl, Ca: 8.2 mg/dl.  Medications reviewed; 10 mg rectal dulcolax/day, 100 mg colace BID, sliding scale novolog, 10 mg IV reglan QID  started 7/16, 40 mg IV protonix/day.  IVF; NS @ 75 ml/hr.     NUTRITION - FOCUSED PHYSICAL EXAM:  Flowsheet Row Most Recent Value  Orbital Region No depletion  Upper Arm Region Mild depletion  Thoracic and Lumbar Region Unable to assess  Buccal Region No depletion  Temple Region No depletion  Clavicle Bone Region No depletion  Clavicle and Acromion Bone Region No depletion  Scapular Bone Region No depletion  Dorsal Hand No depletion  Patellar Region No depletion  Anterior Thigh Region Mild depletion  Posterior Calf Region No depletion  Edema (RD Assessment) None  Hair Reviewed  Eyes Reviewed  Mouth Reviewed  Skin Reviewed  Nails Reviewed       Diet Order:   Diet Order             Diet clear liquid Room service appropriate? Yes; Fluid consistency: Thin  Diet effective now                   EDUCATION NEEDS:   Education needs have been addressed  Skin:  Skin Assessment: Skin Integrity Issues: Skin Integrity Issues:: Incisions Incisions: upper umbilicus (0/94)  Last BM:  7/17 (type 7 x1, small amount)  Height:   Ht Readings from Last 1 Encounters:  10/04/21 '5\' 11"'$  (1.803 m)    Weight:   Wt Readings from Last 1 Encounters:  10/04/21 74.4 kg    BMI:  Body mass index is 22.88 kg/m.  Estimated Nutritional Needs:  Kcal:  2100-2300 kcal Protein:  105-120 grams Fluid:  >/= 2.3 L/day     Jarome Matin, MS, RD, LDN, CNSC Registered Dietitian II Inpatient Clinical Nutrition RD pager # and on-call/weekend pager # available in [facility]

## 2021-10-11 NOTE — Progress Notes (Signed)
PHARMACY - TOTAL PARENTERAL NUTRITION CONSULT NOTE   Indication: Prolonged ileus  Patient Measurements: Height: '5\' 11"'$  (180.3 cm) Weight: 74.4 kg (164 lb 0.4 oz) IBW/kg (Calculated) : 75.3 TPN AdjBW (KG): 74.4 Body mass index is 22.88 kg/m. Usual Weight:   Assessment:  66 yo male with hx HTN, HLD, prostate cancer s/p LUTS. S/p robotic assisted laparoscopic radical prostatectomy, bilateral robotic assisted laparoscopic pelvic lymphadenectomy, LOA, oversew of small bowel serosal injuries on 10/04/21.  Glucose / Insulin: No hx DM.  AM glucose <100 Electrolytes: WNL including Corr Ca Renal: AKI on admit, SCr improved today Hepatic: WNL, Alb low 2.2 Trig: WNL Intake / Output: NG 1100cc, LBM 7/16 MIVF: NS at 75 ml/hr GI Imaging: 7/16: CTa without abscess or fluid collection but concern for small bowel obstruction. GI Surgeries / Procedures:  7/10: surgery as per HPI  Central access: plan PICC 7/17 TPN start date: plan 7/17  Nutritional Goals: pending Goal TPN rate is ___ mL/hr (provides ___ g of protein and ___ kcals per day)  RD Assessment: pending    Current Nutrition:  Clear liquids  Plan:  Start TPN at 40 mL/hr at 1800 Electrolytes in TPN: Na 72mq/L, K 558m/L, Ca 107m50mL, Mg 107mE49m, and Phos 1107mm74m. Cl:Ac 1:1 Add standard MVI and trace elements to TPN Initiate Sensitive q8h SSI and adjust as needed  Reduce MIVF to 35 mL/hr at 1800 Monitor TPN labs on Mon/Thurs, CMET/Mg and Phos in AM  Ivan Vonstein Peggyann JubarmD, BCPS Pharmacy: [phone]/2023,7:02 AM

## 2021-10-11 NOTE — Progress Notes (Signed)
40 ml serosanguineous fluid emptied from fecal pouch from L side abdominal incision.

## 2021-10-11 NOTE — Progress Notes (Signed)
   10/11/21 1510  Mobility  Activity Ambulated with assistance in hallway  Level of Assistance Standby assist, set-up cues, supervision of patient - no hands on  Assistive Device Front wheel walker  Distance Ambulated (ft) 620 ft  Activity Response Tolerated well  Transport method Ambulatory  $Mobility charge 1 Mobility   Pt eager to mobilize in the afternoon. Requested the RW for assistance. Got up with stand-by assistance from the bed. Proceeded to walk 668f in the hallway with no complaints. When returned to room was left at the chair with necessities in reach, family, and RN in room.   APond CreekSpecialist [facility] Phone: [phone]/17/23, 3:15 PM

## 2021-10-11 NOTE — Progress Notes (Signed)
Progress Note  7 Days Post-Op  Subjective: Pt reports he had a BM this AM and overall feels better than yesterday. He is still having bilious drainage from NGT though. No abdominal pain this AM. Abdomen still slightly distended.   Objective: Vital signs in last 24 hours: Temp:  [98.6 F (37 C)-99.2 F (37.3 C)] 99.2 F (37.3 C) (07/17 1201) Pulse Rate:  [97-108] 98 (07/17 1201) Resp:  [14-18] 16 (07/17 1201) BP: (114-133)/(68-87) 124/78 (07/17 1201) SpO2:  [97 %-99 %] 99 % (07/17 1201) Last BM Date :  (per pt the "BM' from yesterday was more like mucus)  Intake/Output from previous day: 07/16 0701 - 07/17 0700 In: 933.9 [I.V.:933.9] Out: 1550 [Urine:450; Emesis/NG output:1100] Intake/Output this shift: No intake/output data recorded.  PE: Gen:  Alert, NAD, pleasant Pulm:  Normal effort Abd: abdomen distended but soft, non-tender, BS hypoactive, NGT with bilious drainage  Skin: warm and dry, no rashes  Psych: A&Ox3    Lab Results:  Recent Labs    10/10/21 0430 10/11/21 0449  WBC 8.1 10.3  HGB 10.5* 10.4*  HCT 32.6* 33.2*  PLT 309 349   BMET Recent Labs    10/10/21 0430 10/11/21 0449  NA 144 142  K 4.3 3.7  CL 113* 111  CO2 24 21*  GLUCOSE 111* 83  BUN 29* 30*  CREATININE 1.21 1.12  CALCIUM 8.1* 8.2*   PT/INR No results for input(s): "LABPROT", "INR" in the last 72 hours. CMP     Component Value Date/Time   NA 142 10/11/2021 0449   K 3.7 10/11/2021 0449   CL 111 10/11/2021 0449   CO2 21 (L) 10/11/2021 0449   GLUCOSE 83 10/11/2021 0449   BUN 30 (H) 10/11/2021 0449   CREATININE 1.12 10/11/2021 0449   CALCIUM 8.2 (L) 10/11/2021 0449   PROT 6.1 (L) 10/11/2021 0449   ALBUMIN 2.2 (L) 10/11/2021 0449   AST 22 10/11/2021 0449   ALT 30 10/11/2021 0449   ALKPHOS 64 10/11/2021 0449   BILITOT 1.1 10/11/2021 0449   GFRNONAA >60 10/11/2021 0449   Lipase  No results found for: "LIPASE"     Studies/Results: [place] EKG SITE RITE  Result Date:  10/11/2021 If Site Rite image not attached, placement could not be confirmed due to current cardiac rhythm.  CT ABDOMEN PELVIS W CONTRAST  Result Date: 10/10/2021 CLINICAL DATA:  Bowel obstruction, prostate cancer status post prostatectomy and adhesiolysis EXAM: CT ABDOMEN AND PELVIS WITH CONTRAST TECHNIQUE: Multidetector CT imaging of the abdomen and pelvis was performed using the standard protocol following bolus administration of intravenous contrast. RADIATION DOSE REDUCTION: This exam was performed according to the departmental dose-optimization program which includes automated exposure control, adjustment of the mA and/or kV according to patient size and/or use of iterative reconstruction technique. CONTRAST:  1100m OMNIPAQUE IOHEXOL 300 MG/ML  SOLN COMPARISON:  02/15/2021 FINDINGS: Lower chest: Trace right pleural effusion with associated right basilar atelectasis. Nasogastric tube is seen extending into the gastric fundus. Cardiac size within normal limits. Hepatobiliary: Simple cyst within the central right hepatic lobe is unchanged. Liver otherwise unremarkable. Cholelithiasis without pericholecystic inflammatory change noted. No intra or extrahepatic biliary ductal dilation. Pancreas: Unremarkable Spleen: Unremarkable Adrenals/Urinary Tract: The adrenal glands are unremarkable. The kidneys are normal. The bladder is decompressed with a Foley catheter balloon seen within its lumen. Stomach/Bowel: A mid small bowel obstruction is present with a single point of transition identified within the hypogastric region best seen on axial image # 64/2. The  proximal small bowel is dilated in fluid-filled. Distally, the small bowel is decompressed small free intraperitoneal gas is present in keeping with given history of recent intra-abdominal surgery. The large bowel is unremarkable. Appendectomy has been performed. Trace free fluid within the pelvis anteriorly. Vascular/Lymphatic: Aortic atherosclerosis. No  enlarged abdominal or pelvic lymph nodes. Reproductive: Surgical changes of prostatectomy are identified with small amount of gas and fluid within the prostatectomy bed. Other: There is mild circumferential thickening of the a distal rectum which may reflect changes of mild proctitis. No evidence of obstruction or perforation. Mild perirectal edema is nonspecific and may be inflammatory or postsurgical in nature. Musculoskeletal: No acute bone abnormality. No lytic or blastic bone lesion. IMPRESSION: 1. Mid small bowel obstruction with a single point of transition identified within the hypogastric region. Mild free intraperitoneal gas in keeping with given history of recent intra-abdominal surgery. Trace ascites. 2. Surgical changes of prostatectomy with small amount of gas and fluid within the prostatectomy bed. 3. Mild circumferential thickening of the distal rectum which may reflect changes of mild proctitis. No evidence of obstruction or perforation. 4. Cholelithiasis. 5. Trace right pleural effusion with associated right basilar atelectasis. Aortic Atherosclerosis (ICD10-I70.0). Electronically Signed   By: Fidela Salisbury M.D.   On: 10/10/2021 20:45   [place] EKG SITE RITE  Result Date: 10/10/2021 If Site Rite image not attached, placement could not be confirmed due to current cardiac rhythm.  DG Abd Portable 1V  Result Date: 10/10/2021 CLINICAL DATA:  Ileus EXAM: PORTABLE ABDOMEN - 1 VIEW COMPARISON:  One-view abdomen 10/07/2021 FINDINGS: Dilated loops of small bowel have increased in caliber since the prior exam. No definite free air is present on the supine images. Rectal tube is in place. No other enteric tube is visualized on this study. IMPRESSION: 1. Increasing caliber of dilated loops of small bowel compatible with obstruction or ileus. 2. Rectal tube in place. No other enteric tube is visualized. Electronically Signed   By: San Morelle M.D.   On: 10/10/2021 13:58     Anti-infectives: Anti-infectives (From admission, onward)    Start     Dose/Rate Route Frequency Ordered Stop   10/04/21 2330  ceFAZolin (ANCEF) IVPB 1 g/50 mL premix        1 g 100 mL/hr over 30 Minutes Intravenous Every 8 hours 10/04/21 1737 10/05/21 1014   10/04/21 0922  ceFAZolin (ANCEF) IVPB 2g/100 mL premix        2 g 200 mL/hr over 30 Minutes Intravenous 30 min pre-op 10/04/21 0922 10/04/21 1530   10/04/21 0000  sulfamethoxazole-trimethoprim (BACTRIM DS) 800-160 MG tablet        1 tablet Oral 2 times daily 10/04/21 1048          Assessment/Plan POD#7 S/P Robotic assisted laparoscopic radical prostatectomy (bilateral nerve sparing) Dr. Alinda Money Bilateral robotic assisted laparoscopic pelvic lymphadenectomy Dr. Alinda Money Laparoscopic lysis of adesions, oversew of small bowel serosal injuries x2 Dr Brantley Stage    CT yesterday with question of early SBO, ileus still seems more likely but will discuss with MD as well  Having some return in bowel function. NGT resumed to suction yesterday - still having bilious output and abdominal exam still with distention and hypoactive bowel sounds today.  Recommend PICC/TPN If having more bowel function and KUB ok in AM likely start clamping trials tomorrow but would go slow Mobilize Would not recommend PO meds currently given persistent ileus - sent a message to urology    ID - ancef 7/10  FEN - IVF, NPO, NGT to LIWS, TPN to start VTE - SCDs, ok for chemical dvt ppx from general surgery standpoint Foley - per urology   LOS: 6 days       Norm Parcel, [facility] 10/11/2021, 1:03 PM Please see Amion for pager number during day hours 7:00am-4:30pm

## 2021-10-11 NOTE — Progress Notes (Signed)
Peripherally Inserted Central Catheter Placement  The IV Nurse has discussed with the patient and/or persons authorized to consent for the patient, the purpose of this procedure and the potential benefits and risks involved with this procedure.  The benefits include less needle sticks, lab draws from the catheter, and the patient may be discharged home with the catheter. Risks include, but not limited to, infection, bleeding, blood clot (thrombus formation), and puncture of an artery; nerve damage and irregular heartbeat and possibility to perform a PICC exchange if needed/ordered by physician.  Alternatives to this procedure were also discussed.  Bard Power PICC patient education guide, fact sheet on infection prevention and patient information card has been provided to patient /or left at bedside.  PICC inserted by Claretha Cooper, RN   PICC Placement Documentation  PICC Double Lumen 10/11/21 Right Brachial 37 cm 0 cm (Active)  Indication for Insertion or Continuance of Line Administration of hyperosmolar/irritating solutions (i.e. TPN, Vancomycin, etc.) 10/11/21 1702  Exposed Catheter (cm) 0 cm 10/11/21 1702  Site Assessment Clean;Dry;Intact 10/11/21 1702  Lumen #1 Status Flushed;Saline locked;Blood return noted 10/11/21 1702  Lumen #2 Status Flushed;Saline locked;Blood return noted 10/11/21 1702  Dressing Type Transparent;Securing device 10/11/21 1702  Dressing Status Antimicrobial disc in place 10/11/21 [place] Not Applicable 83/72/90 2111  Line Care Connections checked and tightened 10/11/21 1702  Dressing Intervention New dressing 10/11/21 1702  Dressing Change Due 10/18/21 10/11/21 [place], Nicolette Bang 10/11/2021, 5:03 PM

## 2021-10-11 NOTE — Care Management Important Message (Signed)
Important Message  Patient Details IM Letter given to the Patient. Name: Ivan Lopez MRN: [id number] Date of Birth: [birth year]   Medicare Important Message Given:  Yes     Kerin Salen 10/11/2021, 1:29 PM

## 2021-10-12 ENCOUNTER — Inpatient Hospital Stay (HOSPITAL_COMMUNITY): Payer: Medicare Other

## 2021-10-12 LAB — COMPREHENSIVE METABOLIC PANEL
ALT: 33 U/L (ref 0–44)
AST: 25 U/L (ref 15–41)
Albumin: 2 g/dL — ABNORMAL LOW (ref 3.5–5.0)
Alkaline Phosphatase: 57 U/L (ref 38–126)
Anion gap: 5 (ref 5–15)
BUN: 24 mg/dL — ABNORMAL HIGH (ref 8–23)
CO2: 24 mmol/L (ref 22–32)
Calcium: 7.9 mg/dL — ABNORMAL LOW (ref 8.9–10.3)
Chloride: 115 mmol/L — ABNORMAL HIGH (ref 98–111)
Creatinine, Ser: 1.06 mg/dL (ref 0.61–1.24)
GFR, Estimated: >60 mL/min (ref >60)
Glucose, Bld: 133 mg/dL — ABNORMAL HIGH (ref 70–99)
Potassium: 3.3 mmol/L — ABNORMAL LOW (ref 3.5–5.1)
Sodium: 144 mmol/L (ref 135–145)
Total Bilirubin: 0.6 mg/dL (ref 0.3–1.2)
Total Protein: 5.5 g/dL — ABNORMAL LOW (ref 6.5–8.1)

## 2021-10-12 LAB — PHOSPHORUS: Phosphorus: 2.3 mg/dL — ABNORMAL LOW (ref 2.5–4.6)

## 2021-10-12 LAB — HEMOGLOBIN A1C
Hgb A1c MFr Bld: 5 % (ref 4.8–5.6)
Mean Plasma Glucose: 96.8 mg/dL

## 2021-10-12 LAB — GLUCOSE, CAPILLARY
Glucose-Capillary: 133 mg/dL — ABNORMAL HIGH (ref 70–99)
Glucose-Capillary: 136 mg/dL — ABNORMAL HIGH (ref 70–99)
Glucose-Capillary: 120 mg/dL — ABNORMAL HIGH (ref 70–99)

## 2021-10-12 LAB — MAGNESIUM: Magnesium: 2.2 mg/dL (ref 1.7–2.4)

## 2021-10-12 MED ORDER — POTASSIUM CHLORIDE 10 MEQ/50ML IV SOLN
10.0000 meq | INTRAVENOUS | Status: AC
  Administered 2021-10-12 (×2): 10 meq via INTRAVENOUS
  Filled 2021-10-12 (×4): qty 50

## 2021-10-12 MED ORDER — DIATRIZOATE MEGLUMINE & SODIUM 66-10 % PO SOLN
90.0000 mL | Freq: Once | ORAL | Status: AC
Start: 2021-10-12 — End: 2021-10-12
  Administered 2021-10-12: 90 mL via NASOGASTRIC
  Filled 2021-10-12: qty 90

## 2021-10-12 MED ORDER — TRAVASOL 10 % IV SOLN
INTRAVENOUS | Status: AC
  Filled 2021-10-12: qty 748.8

## 2021-10-12 MED ORDER — POTASSIUM PHOSPHATES 15 MMOLE/5ML IV SOLN
15.0000 mmol | Freq: Once | INTRAVENOUS | Status: AC
  Administered 2021-10-12: 15 mmol via INTRAVENOUS
  Filled 2021-10-12: qty 5

## 2021-10-12 MED ORDER — SODIUM CHLORIDE 0.9 % IV SOLN: INTRAVENOUS | Status: DC

## 2021-10-12 NOTE — Progress Notes (Signed)
Progress Note  8 Days Post-Op  Subjective: Pt reports more bowel function overnight. Feels like he can hear his stomach rumbling more. Feels less bloated this AM. Wife at bedside.   Objective: Vital signs in last 24 hours: Temp:  [98.9 F (37.2 C)-99.9 F (37.7 C)] 98.9 F (37.2 C) (07/18 1300) Pulse Rate:  [89-110] 91 (07/18 1300) Resp:  [16-20] 16 (07/18 1300) BP: (113-140)/(63-69) 140/63 (07/18 1300) SpO2:  [96 %-99 %] 99 % (07/18 1300) Last BM Date :  (per pt the "BM' from yesterday was more like mucus)  Intake/Output from previous day: 07/17 0701 - 07/18 0700 In: 1436.1 [P.O.:100; I.V.:1036.1; IV Piggyback:300] Out: 725 [Urine:300; Emesis/NG output:350; Drains:75] Intake/Output this shift: Total I/O In: 240 [P.O.:240] Out: 300 [Urine:300]  PE: Gen:  Alert, NAD, pleasant Pulm:  Normal effort Abd: abdomen distended but soft, non-tender, BS a little more active otday, NGT with bilious drainage  Skin: warm and dry, no rashes  Psych: A&Ox3    Lab Results:  Recent Labs    10/10/21 0430 10/11/21 0449  WBC 8.1 10.3  HGB 10.5* 10.4*  HCT 32.6* 33.2*  PLT 309 349   BMET Recent Labs    10/11/21 0449 10/12/21 0428  NA 142 144  K 3.7 3.3*  CL 111 115*  CO2 21* 24  GLUCOSE 83 133*  BUN 30* 24*  CREATININE 1.12 1.06  CALCIUM 8.2* 7.9*   PT/INR No results for input(s): "LABPROT", "INR" in the last 72 hours. CMP     Component Value Date/Time   NA 144 10/12/2021 0428   K 3.3 (L) 10/12/2021 0428   CL 115 (H) 10/12/2021 0428   CO2 24 10/12/2021 0428   GLUCOSE 133 (H) 10/12/2021 0428   BUN 24 (H) 10/12/2021 0428   CREATININE 1.06 10/12/2021 0428   CALCIUM 7.9 (L) 10/12/2021 0428   PROT 5.5 (L) 10/12/2021 0428   ALBUMIN 2.0 (L) 10/12/2021 0428   AST 25 10/12/2021 0428   ALT 33 10/12/2021 0428   ALKPHOS 57 10/12/2021 0428   BILITOT 0.6 10/12/2021 0428   GFRNONAA >60 10/12/2021 0428   Lipase  No results found for:  "LIPASE"     Studies/Results: DG Abd Portable 1V  Result Date: 10/12/2021 CLINICAL DATA:  Ileus EXAM: PORTABLE ABDOMEN - 1 VIEW COMPARISON:  10/10/2021 abdominal radiograph FINDINGS: Inferior approach midline pelvic catheter in place. Diffusely moderately dilated small bowel loops throughout the central abdomen measuring up to 5.2 cm diameter, overall mildly worsened. Retained oral contrast throughout the nondistended colon. No evidence of pneumatosis or pneumoperitoneum. Mild lumbar spondylosis. IMPRESSION: Diffusely moderately dilated small bowel loops throughout the central abdomen, overall mildly worsened. Retained oral contrast throughout the nondistended colon. Findings are compatible with interval worsening of reported adynamic ileus. Electronically Signed   By: Ilona Sorrel M.D.   On: 10/12/2021 08:18   [place] EKG SITE RITE  Result Date: 10/11/2021 If Site Rite image not attached, placement could not be confirmed due to current cardiac rhythm.  CT ABDOMEN PELVIS W CONTRAST  Result Date: 10/10/2021 CLINICAL DATA:  Bowel obstruction, prostate cancer status post prostatectomy and adhesiolysis EXAM: CT ABDOMEN AND PELVIS WITH CONTRAST TECHNIQUE: Multidetector CT imaging of the abdomen and pelvis was performed using the standard protocol following bolus administration of intravenous contrast. RADIATION DOSE REDUCTION: This exam was performed according to the departmental dose-optimization program which includes automated exposure control, adjustment of the mA and/or kV according to patient size and/or use of iterative reconstruction technique. CONTRAST:  119m OMNIPAQUE IOHEXOL 300 MG/ML  SOLN COMPARISON:  02/15/2021 FINDINGS: Lower chest: Trace right pleural effusion with associated right basilar atelectasis. Nasogastric tube is seen extending into the gastric fundus. Cardiac size within normal limits. Hepatobiliary: Simple cyst within the central right hepatic lobe is unchanged. Liver otherwise  unremarkable. Cholelithiasis without pericholecystic inflammatory change noted. No intra or extrahepatic biliary ductal dilation. Pancreas: Unremarkable Spleen: Unremarkable Adrenals/Urinary Tract: The adrenal glands are unremarkable. The kidneys are normal. The bladder is decompressed with a Foley catheter balloon seen within its lumen. Stomach/Bowel: A mid small bowel obstruction is present with a single point of transition identified within the hypogastric region best seen on axial image # 64/2. The proximal small bowel is dilated in fluid-filled. Distally, the small bowel is decompressed small free intraperitoneal gas is present in keeping with given history of recent intra-abdominal surgery. The large bowel is unremarkable. Appendectomy has been performed. Trace free fluid within the pelvis anteriorly. Vascular/Lymphatic: Aortic atherosclerosis. No enlarged abdominal or pelvic lymph nodes. Reproductive: Surgical changes of prostatectomy are identified with small amount of gas and fluid within the prostatectomy bed. Other: There is mild circumferential thickening of the a distal rectum which may reflect changes of mild proctitis. No evidence of obstruction or perforation. Mild perirectal edema is nonspecific and may be inflammatory or postsurgical in nature. Musculoskeletal: No acute bone abnormality. No lytic or blastic bone lesion. IMPRESSION: 1. Mid small bowel obstruction with a single point of transition identified within the hypogastric region. Mild free intraperitoneal gas in keeping with given history of recent intra-abdominal surgery. Trace ascites. 2. Surgical changes of prostatectomy with small amount of gas and fluid within the prostatectomy bed. 3. Mild circumferential thickening of the distal rectum which may reflect changes of mild proctitis. No evidence of obstruction or perforation. 4. Cholelithiasis. 5. Trace right pleural effusion with associated right basilar atelectasis. Aortic  Atherosclerosis (ICD10-I70.0). Electronically Signed   By: AFidela SalisburyM.D.   On: 10/10/2021 20:45   [place] SITE R[place]  Result Date: 10/10/2021 If Site Rite image not attached, placement could not be confirmed due to current cardiac rhythm.   Anti-infectives: Anti-infectives (From admission, onward)    Start     Dose/Rate Route Frequency Ordered Stop   10/04/21 2330  ceFAZolin (ANCEF) IVPB 1 g/50 mL premix        1 g 100 mL/hr over 30 Minutes Intravenous Every 8 hours 10/04/21 1737 10/05/21 1014   10/04/21 0922  ceFAZolin (ANCEF) IVPB 2g/100 mL premix        2 g 200 mL/hr over 30 Minutes Intravenous 30 min pre-op 10/04/21 0922 10/04/21 1530   10/04/21 0000  sulfamethoxazole-trimethoprim (BACTRIM DS) 800-160 MG tablet        1 tablet Oral 2 times daily 10/04/21 1048          Assessment/Plan POD#8 S/P Robotic assisted laparoscopic radical prostatectomy (bilateral nerve sparing) Dr. BAlinda MoneyBilateral robotic assisted laparoscopic pelvic lymphadenectomy Dr. BAlinda MoneyLaparoscopic lysis of adesions, oversew of small bowel serosal injuries x2 Dr CBrantley Stage   CT yesterday with question of early SBO, ileus still seems more likely  Having some return in bowel function. NGT resumed to suction 7/16 - still having bilious output and KUB this AM with persistent adynamic ileus  Clinically patient seems a little improved - abdomen a little softer and more BS and having more bowel function Start SBO protocol today to better clarify SBO vs ileus, may also be somewhat therapeutic  Recommend PICC/TPN Mobilize  ID - ancef 7/10 FEN - IVF, NPO, NGT to LIWS, TPN VTE - SCDs, SQH Foley - per urology    LOS: 7 days       Norm Parcel, [facility] Surgery 10/12/2021, 2:23 PM Please see Amion for pager number during day hours 7:00am-4:30pm

## 2021-10-12 NOTE — Progress Notes (Signed)
PHARMACY - TOTAL PARENTERAL NUTRITION CONSULT NOTE   Indication: Prolonged ileus  Patient Measurements: Height: '5\' 11"'$  (180.3 cm) Weight: 74.4 kg (164 lb 0.4 oz) IBW/kg (Calculated) : 75.3 TPN AdjBW (KG): 74.4 Body mass index is 22.88 kg/m. Usual Weight:   Assessment:  66 yo male with hx HTN, HLD, prostate cancer s/p LUTS. S/p robotic assisted laparoscopic radical prostatectomy, bilateral robotic assisted laparoscopic pelvic lymphadenectomy, LOA, oversew of small bowel serosal injuries on 10/04/21.  Glucose / Insulin: No hx DM.  A1c 5.0 - CBG range 122-133, 1 unit SSI required Electrolytes: K low (3.3), Cl elevated (115), Phos low (2.3) Renal: AKI on admit, SCr improved today Hepatic: WNL, Alb low 2.0 Trig: WNL Intake / Output: NG 350cc, LBM 7/17 MIVF: NS at 35 ml/hr GI Imaging: 7/16: CTa without abscess or fluid collection but concern for small bowel obstruction. 7/18: AXR GI Surgeries / Procedures:  7/10: surgery as per HPI  Central access: PICC 7/17 TPN start date: 7/17 at 18:00  Nutritional Goals: pending Goal TPN rate is 90 mL/hr (provides 112 g of protein and 2198 kcals per day)  RD Assessment: pending Estimated Needs Total Energy Estimated Needs: 2100-2300 kcal Total Protein Estimated Needs: 105-120 grams Total Fluid Estimated Needs: >/= 2.3 L/day  Current Nutrition:  Clear liquids TPN at 40 ml/hr  Plan:  Potassium chloride 61mq IV x 2 Potassium phosphate 165ml IV x 1 (contains 2274mK+)  Increase TPN to 60 mL/hr at 1800 Electrolytes in TPN: Na 20m61m, K 20mE61m Ca 5mEq/26mMg 5mEq/L86mnd Phos 15mmol/23ml:Ac 1:1 Add standard MVI and trace elements to TPN Continue Sensitive q8h SSI and adjust as needed  Reduce MIVF to KVO mL/hr at 1800 Monitor TPN labs on Mon/Thurs, CMET/Mg and Phos in AM  Quirino Kakos WilPeggyann Juba, BCPS Pharmacy: [phone],7:01 AM

## 2021-10-12 NOTE — Plan of Care (Signed)
°  Problem: Clinical Measurements: °Goal: Ability to maintain clinical measurements within normal limits will improve °Outcome: Progressing °Goal: Will remain free from infection °Outcome: Progressing °Goal: Diagnostic test results will improve °Outcome: Progressing °Goal: Respiratory complications will improve °Outcome: Progressing °  °

## 2021-10-12 NOTE — Progress Notes (Signed)
Notified Radiology that the contrast was administered for Small bowel protocol.

## 2021-10-13 ENCOUNTER — Encounter (HOSPITAL_COMMUNITY): Admission: RE | Disposition: A | Discharge status: Home / Self Care | Payer: Self-pay | Source: Home / Self Care

## 2021-10-13 ENCOUNTER — Inpatient Hospital Stay (HOSPITAL_COMMUNITY): Payer: Medicare Other

## 2021-10-13 LAB — COMPREHENSIVE METABOLIC PANEL
ALT: 27 U/L (ref 0–44)
AST: 18 U/L (ref 15–41)
Albumin: 2 g/dL — ABNORMAL LOW (ref 3.5–5.0)
Alkaline Phosphatase: 63 U/L (ref 38–126)
Anion gap: 6 (ref 5–15)
BUN: 19 mg/dL (ref 8–23)
CO2: 24 mmol/L (ref 22–32)
Calcium: 8.2 mg/dL — ABNORMAL LOW (ref 8.9–10.3)
Chloride: 113 mmol/L — ABNORMAL HIGH (ref 98–111)
Creatinine, Ser: 0.96 mg/dL (ref 0.61–1.24)
GFR, Estimated: >60 mL/min (ref >60)
Glucose, Bld: 121 mg/dL — ABNORMAL HIGH (ref 70–99)
Potassium: 3.3 mmol/L — ABNORMAL LOW (ref 3.5–5.1)
Sodium: 143 mmol/L (ref 135–145)
Total Bilirubin: 0.7 mg/dL (ref 0.3–1.2)
Total Protein: 6 g/dL — ABNORMAL LOW (ref 6.5–8.1)

## 2021-10-13 LAB — GLUCOSE, CAPILLARY
Glucose-Capillary: 121 mg/dL — ABNORMAL HIGH (ref 70–99)
Glucose-Capillary: 129 mg/dL — ABNORMAL HIGH (ref 70–99)
Glucose-Capillary: 142 mg/dL — ABNORMAL HIGH (ref 70–99)

## 2021-10-13 LAB — PHOSPHORUS: Phosphorus: 2.5 mg/dL (ref 2.5–4.6)

## 2021-10-13 LAB — MAGNESIUM: Magnesium: 2.1 mg/dL (ref 1.7–2.4)

## 2021-10-13 SURGERY — TURBT (TRANSURETHRAL RESECTION OF BLADDER TUMOR)
Anesthesia: Choice

## 2021-10-13 MED ORDER — TRAVASOL 10 % IV SOLN
INTRAVENOUS | Status: AC
  Filled 2021-10-13: qty 1123.2

## 2021-10-13 MED ORDER — POTASSIUM CHLORIDE 10 MEQ/50ML IV SOLN
10.0000 meq | INTRAVENOUS | Status: AC
  Administered 2021-10-13 (×4): 10 meq via INTRAVENOUS
  Filled 2021-10-13 (×4): qty 50

## 2021-10-13 MED ORDER — BISACODYL 10 MG RE SUPP
10.0000 mg | Freq: Every day | RECTAL | Status: DC | PRN
Start: 2021-10-13 — End: 2021-10-16
  Administered 2021-10-14: 10 mg via RECTAL
  Filled 2021-10-13: qty 1

## 2021-10-13 NOTE — Plan of Care (Signed)
  Problem: Clinical Measurements: Goal: Diagnostic test results will improve Outcome: Progressing Goal: Respiratory complications will improve Outcome: Progressing   Problem: Activity: Goal: Risk for activity intolerance will decrease Outcome: Progressing   

## 2021-10-13 NOTE — Progress Notes (Signed)
Progress Note  9 Days Post-Op  Subjective: Pt reports continued bowel function. Started SBO protocol yesterday and discussed 8h delay film with wife and patient. He has already been up walking this AM. Wife at bedside.   Objective: Vital signs in last 24 hours: Temp:  [98.9 F (37.2 C)-99.1 F (37.3 C)] 99.1 F (37.3 C) (07/19 0507) Pulse Rate:  [91-101] 101 (07/19 0507) Resp:  [16-20] 18 (07/19 0507) BP: (129-140)/(63-77) 140/74 (07/19 0507) SpO2:  [99 %] 99 % (07/19 0507) Last BM Date : 10/13/21  Intake/Output from previous day: 07/18 0701 - 07/19 0700 In: 1992.3 [P.O.:480; I.V.:1280.5; IV Piggyback:231.8] Out: 3400 [Urine:1600; Emesis/NG output:1400; Drains:400] Intake/Output this shift: No intake/output data recorded.  PE: Gen:  Alert, NAD, pleasant Pulm:  Normal effort Abd: abdomen distended but soft, non-tender, +BS, NGT with bilious drainage  Skin: warm and dry, no rashes  Psych: A&Ox3    Lab Results:  Recent Labs    10/11/21 0449  WBC 10.3  HGB 10.4*  HCT 33.2*  PLT 349    BMET Recent Labs    10/12/21 0428 10/13/21 0352  NA 144 143  K 3.3* 3.3*  CL 115* 113*  CO2 24 24  GLUCOSE 133* 121*  BUN 24* 19  CREATININE 1.06 0.96  CALCIUM 7.9* 8.2*    PT/INR No results for input(s): "LABPROT", "INR" in the last 72 hours. CMP     Component Value Date/Time   NA 143 10/13/2021 0352   K 3.3 (L) 10/13/2021 0352   CL 113 (H) 10/13/2021 0352   CO2 24 10/13/2021 0352   GLUCOSE 121 (H) 10/13/2021 0352   BUN 19 10/13/2021 0352   CREATININE 0.96 10/13/2021 0352   CALCIUM 8.2 (L) 10/13/2021 0352   PROT 6.0 (L) 10/13/2021 0352   ALBUMIN 2.0 (L) 10/13/2021 0352   AST 18 10/13/2021 0352   ALT 27 10/13/2021 0352   ALKPHOS 63 10/13/2021 0352   BILITOT 0.7 10/13/2021 0352   GFRNONAA >60 10/13/2021 0352   Lipase  No results found for: "LIPASE"     Studies/Results: DG Abd Portable 1V-Small Bowel Obstruction Protocol-initial, 8 hr delay  Result  Date: 10/13/2021 CLINICAL DATA:  Small-bowel obstruction. EXAM: PORTABLE ABDOMEN - 1 VIEW COMPARISON:  10/12/2021 FINDINGS: Administered oral contrast opacifies nondistended ascending colon. Multiple gas-filled dilated loops of small bowel are again seen within the mid abdomen. Together, the findings suggest un underlying high-grade partial small bowel obstruction. No gross free intraperitoneal gas. Foley catheter balloon overlies the expected position within the mid pelvis. IMPRESSION: Findings compatible with un underlying partial small bowel obstruction. Oral contrast has reached the ascending colon. Electronically Signed   By: Fidela Salisbury M.D.   On: 10/13/2021 01:48   DG Abd Portable 1V  Result Date: 10/12/2021 CLINICAL DATA:  Ileus EXAM: PORTABLE ABDOMEN - 1 VIEW COMPARISON:  10/10/2021 abdominal radiograph FINDINGS: Inferior approach midline pelvic catheter in place. Diffusely moderately dilated small bowel loops throughout the central abdomen measuring up to 5.2 cm diameter, overall mildly worsened. Retained oral contrast throughout the nondistended colon. No evidence of pneumatosis or pneumoperitoneum. Mild lumbar spondylosis. IMPRESSION: Diffusely moderately dilated small bowel loops throughout the central abdomen, overall mildly worsened. Retained oral contrast throughout the nondistended colon. Findings are compatible with interval worsening of reported adynamic ileus. Electronically Signed   By: Ilona Sorrel M.D.   On: 10/12/2021 08:18   [place] EKG SITE RITE  Result Date: 10/11/2021 If Site Rite image not attached, placement could not be confirmed  due to current cardiac rhythm.   Anti-infectives: Anti-infectives (From admission, onward)    Start     Dose/Rate Route Frequency Ordered Stop   10/04/21 2330  ceFAZolin (ANCEF) IVPB 1 g/50 mL premix        1 g 100 mL/hr over 30 Minutes Intravenous Every 8 hours 10/04/21 1737 10/05/21 1014   10/04/21 0922  ceFAZolin (ANCEF) IVPB 2g/100 mL premix         2 g 200 mL/hr over 30 Minutes Intravenous 30 min pre-op 10/04/21 0922 10/04/21 1530   10/04/21 0000  sulfamethoxazole-trimethoprim (BACTRIM DS) 800-160 MG tablet        1 tablet Oral 2 times daily 10/04/21 1048          Assessment/Plan POD#9 S/P Robotic assisted laparoscopic radical prostatectomy (bilateral nerve sparing) Dr. Alinda Money Bilateral robotic assisted laparoscopic pelvic lymphadenectomy Dr. Alinda Money Laparoscopic lysis of adesions, oversew of small bowel serosal injuries x2 Dr Brantley Stage    CT 7/16 with question of early SBO, ileus still seems more likely  Having some return in bowel function. NGT resumed to suction 7/16  Clinically patient seems a little improved - abdomen a little softer and more BS and having more bowel function SBO protocol started yesterday - 8h delay with contrast in ascending colon which points more towards post-op ileus  Start clamping trials this AM - 4 hrs clamped and 2 hrs suction and record residuals, allow clears as tolerated with NGT clamped Will follow up 24h film and if pt tolerates clamping trials today will discuss stretching intervals vs just removing NGT tomorrow  Recommend PICC/TPN Mobilize   ID - ancef 7/10 FEN - IVF, NPO, NGT to LIWS, TPN VTE - SCDs, SQH Foley - per urology    LOS: 8 days       Norm Parcel, [facility] Surgery 10/13/2021, 10:50 AM Please see Amion for pager number during day hours 7:00am-4:30pm

## 2021-10-13 NOTE — Progress Notes (Signed)
PHARMACY - TOTAL PARENTERAL NUTRITION CONSULT NOTE   Indication: Prolonged ileus  Patient Measurements: Height: '5\' 11"'$  (180.3 cm) Weight: 74.4 kg (164 lb 0.4 oz) IBW/kg (Calculated) : 75.3 TPN AdjBW (KG): 74.4 Body mass index is 22.88 kg/m. Usual Weight:   Assessment:  66 yo male with hx HTN, HLD, prostate cancer s/p LUTS. S/p robotic assisted laparoscopic radical prostatectomy, bilateral robotic assisted laparoscopic pelvic lymphadenectomy, LOA, oversew of small bowel serosal injuries on 10/04/21.  Glucose / Insulin: No hx DM.  A1c 5.0 - CBG range 120-136, 1 unit SSI required Electrolytes: K low (3.3), Cl elevated (113), Phos WNL after replacement yesterday Renal: AKI on admit, SCr improved today Hepatic: WNL, Alb low 2.0 Trig: WNL Intake / Output: NG to LIWS 1400cc, drains 300cc MIVF: NS at [facility] GI Imaging: 7/16: CTa without abscess or fluid collection but concern for small bowel obstruction. 7/18: AXR: Findings are compatible with interval worsening of reported adynamic ileus 7/19: AXR: Findings compatible with un underlying partial small bowel obstruction. Oral contrast has reached the ascending colon. GI Surgeries / Procedures:  7/10: surgery as per HPI  Central access: PICC 7/17 TPN start date: 7/17 at 18:00  Nutritional Goals: pending Goal TPN rate is 90 mL/hr (provides 112 g of protein and 2198 kcals per day)  RD Assessment: pending Estimated Needs Total Energy Estimated Needs: 2100-2300 kcal Total Protein Estimated Needs: 105-120 grams Total Fluid Estimated Needs: >/= 2.3 L/day  Current Nutrition:  Clear liquids TPN at 60 ml/hr  Plan:  Potassium chloride 55mq IV x 4  Increase TPN to goal 978mhr at 1800 Electrolytes in TPN: Na 5072mL, K 66m36m, Ca 5mEq66m Mg 5mEq/28mand Phos 15mmol45mCl:Ac 1:2 Add standard MVI and trace elements to TPN Continue Sensitive q8h SSI and adjust as needed  MIVF to KVO mL/hr at 1800 Monitor TPN labs on Mon/Thurs  Ivan Lopez  WPeggyann JubaD, BCPS Pharmacy: [phone],7:07 AM

## 2021-10-14 LAB — PHOSPHORUS: Phosphorus: 2.6 mg/dL (ref 2.5–4.6)

## 2021-10-14 LAB — COMPREHENSIVE METABOLIC PANEL
ALT: 70 U/L — ABNORMAL HIGH (ref 0–44)
AST: 77 U/L — ABNORMAL HIGH (ref 15–41)
Albumin: 1.9 g/dL — ABNORMAL LOW (ref 3.5–5.0)
Alkaline Phosphatase: 88 U/L (ref 38–126)
Anion gap: 8 (ref 5–15)
BUN: 18 mg/dL (ref 8–23)
CO2: 22 mmol/L (ref 22–32)
Calcium: 8.1 mg/dL — ABNORMAL LOW (ref 8.9–10.3)
Chloride: 110 mmol/L (ref 98–111)
Creatinine, Ser: 0.91 mg/dL (ref 0.61–1.24)
GFR, Estimated: >60 mL/min (ref >60)
Glucose, Bld: 115 mg/dL — ABNORMAL HIGH (ref 70–99)
Potassium: 3.5 mmol/L (ref 3.5–5.1)
Sodium: 140 mmol/L (ref 135–145)
Total Bilirubin: 0.6 mg/dL (ref 0.3–1.2)
Total Protein: 5.6 g/dL — ABNORMAL LOW (ref 6.5–8.1)

## 2021-10-14 LAB — GLUCOSE, CAPILLARY
Glucose-Capillary: 150 mg/dL — ABNORMAL HIGH (ref 70–99)
Glucose-Capillary: 134 mg/dL — ABNORMAL HIGH (ref 70–99)
Glucose-Capillary: 144 mg/dL — ABNORMAL HIGH (ref 70–99)

## 2021-10-14 LAB — MAGNESIUM: Magnesium: 1.7 mg/dL (ref 1.7–2.4)

## 2021-10-14 LAB — TRIGLYCERIDES: Triglycerides: 89 mg/dL (ref <150)

## 2021-10-14 MED ORDER — ORAL CARE MOUTH RINSE: 15.0000 mL | OROMUCOSAL | Status: DC | PRN

## 2021-10-14 MED ORDER — SULFAMETHOXAZOLE-TRIMETHOPRIM 800-160 MG PO TABS
1.0000 | ORAL_TABLET | Freq: Two times a day (BID) | ORAL | Status: AC
Start: 2021-10-14 — End: 2021-10-16
  Administered 2021-10-14 – 2021-10-16 (×6): 1 via ORAL
  Filled 2021-10-14 (×6): qty 1

## 2021-10-14 MED ORDER — TRAVASOL 10 % IV SOLN
INTRAVENOUS | Status: AC
  Filled 2021-10-14: qty 1123.2

## 2021-10-14 MED ORDER — MAGNESIUM SULFATE 2 GM/50ML IV SOLN
2.0000 g | Freq: Once | INTRAVENOUS | Status: AC
  Administered 2021-10-14: 2 g via INTRAVENOUS
  Filled 2021-10-14: qty 50

## 2021-10-14 MED ORDER — POTASSIUM CHLORIDE 10 MEQ/50ML IV SOLN
10.0000 meq | INTRAVENOUS | Status: AC
  Administered 2021-10-14 (×3): 10 meq via INTRAVENOUS
  Filled 2021-10-14 (×3): qty 50

## 2021-10-14 NOTE — Progress Notes (Signed)
Mobility Specialist - Progress Note     10/14/21 1048  Mobility  Activity Ambulated with assistance in hallway  Level of Assistance Contact guard assist, steadying assist  Assistive Device Front wheel walker  Distance Ambulated (ft) 650 ft  Activity Response Tolerated well  Transport method Ambulatory  $Mobility charge 1 Mobility   Pt was agreeable to be mobilized. Entered room and Pt was sitting up on bed ready for ambulation. Pt used RW to ambulate 614f in hallway. Pt did not complain of any dizziness, pain or fatigue during ambulation. Pt left on chair upon returning to the room. Pt was left in room with family and NT in room.   BFerd HibbsMobility Specialist

## 2021-10-14 NOTE — Progress Notes (Signed)
10 Days Post-Op   Subjective/Chief Complaint:  1 - Moderate Risk Prostate Cancer - s/p robotic prostatectomy / node dissection 10/05/22 for pT2N0 prostate cancer with negative margins.  2 - Ileus / Adhesions - required extensive adhesiolysis as part of prostatectomy 7/10. [facility] gen surg assisting.  Post-op ileus managed with NG tube. Decreased NG output overnight and exam improving, continues to have bowel movements.   3 - Nutrition - pt with prolonged ilieus and s/p major surgery. Path to TPN started 7/16.   Feeling well this AM and eager to get NG out, feels hungry and continues to have BMs   Objective: Vital signs in last 24 hours: Temp:  [98.7 F (37.1 C)-99.5 F (37.5 C)] 99.4 F (37.4 C) (07/20 0545) Pulse Rate:  [100-102] 102 (07/20 0545) Resp:  [15-16] 15 (07/20 0545) BP: (125-144)/(71-82) 125/71 (07/20 0545) SpO2:  [97 %-99 %] 97 % (07/20 0545) Last BM Date : 10/13/21  Intake/Output from previous day: 07/19 0701 - 07/20 0700 In: 1308.5 [P.O.:360; I.V.:865.8; IV Piggyback:82.7] Out: 1000 [Urine:375; Emesis/NG output:400; Drains:225] Intake/Output this shift: No intake/output data recorded.   Very pleasant.  NGT in place, bilious output in cannister on LWS Non-labored breathing or RA.  HR low 100s Abdomen soft/non-tender, no r/g, non-typamic Recent port sites c/d/I JP site with small amount of sanguinous leakage  Lab Results:  No results for input(s): "WBC", "HGB", "HCT", "PLT" in the last 72 hours.  BMET Recent Labs    10/13/21 0352 10/14/21 0340  NA 143 140  K 3.3* 3.5  CL 113* 110  CO2 24 22  GLUCOSE 121* 115*  BUN 19 18  CREATININE 0.96 0.91  CALCIUM 8.2* 8.1*    PT/INR No results for input(s): "LABPROT", "INR" in the last 72 hours. ABG No results for input(s): "PHART", "HCO3" in the last 72 hours.  Invalid input(s): "PCO2", "PO2"  Studies/Results: DG Abd Portable 1V-Small Bowel Obstruction Protocol-24 hr delay  Result Date:  10/13/2021 CLINICAL DATA:  Small bowel obstruction. EXAM: PORTABLE ABDOMEN - 1 VIEW COMPARISON:  October 13, 2021 at 12:36 a.m. FINDINGS: Persistent dilation of small bowel loops measuring up to 4.8 cm. Normal colonic bowel gas pattern with oral contrast throughout the colon. Enteric catheter overlies the expected location of gastric body. Catheter overlies the pelvis. IMPRESSION: 1. Persistent small bowel dilation measuring up to 4.8 cm. 2. Oral contrast throughout the colon. Electronically Signed   By: Fidela Salisbury M.D.   On: 10/13/2021 18:00   DG Abd Portable 1V-Small Bowel Obstruction Protocol-initial, 8 hr delay  Result Date: 10/13/2021 CLINICAL DATA:  Small-bowel obstruction. EXAM: PORTABLE ABDOMEN - 1 VIEW COMPARISON:  10/12/2021 FINDINGS: Administered oral contrast opacifies nondistended ascending colon. Multiple gas-filled dilated loops of small bowel are again seen within the mid abdomen. Together, the findings suggest un underlying high-grade partial small bowel obstruction. No gross free intraperitoneal gas. Foley catheter balloon overlies the expected position within the mid pelvis. IMPRESSION: Findings compatible with un underlying partial small bowel obstruction. Oral contrast has reached the ascending colon. Electronically Signed   By: Fidela Salisbury M.D.   On: 10/13/2021 01:48    Anti-infectives: Anti-infectives (From admission, onward)    Start     Dose/Rate Route Frequency Ordered Stop   10/04/21 2330  ceFAZolin (ANCEF) IVPB 1 g/50 mL premix        1 g 100 mL/hr over 30 Minutes Intravenous Every 8 hours 10/04/21 1737 10/05/21 1014   10/04/21 0922  ceFAZolin (ANCEF) IVPB 2g/100 mL  premix        2 g 200 mL/hr over 30 Minutes Intravenous 30 min pre-op 10/04/21 0922 10/04/21 1530   10/04/21 0000  sulfamethoxazole-trimethoprim (BACTRIM DS) 800-160 MG tablet        1 tablet Oral 2 times daily 10/04/21 1048         Assessment/Plan:  1 - Moderate Risk Prostate Cancer - great  prognosis, no further cancer directed care this admission.   2 - Ileus / Adhesions - appreciate gen surg comanagement. Will touch base with them today about results of clamping trial and possible progression/removal  3 - Nutrition - Continue TPN for now. Appreciate general surgery help with diet recommendations  [name] 10/14/2021

## 2021-10-14 NOTE — Plan of Care (Signed)
  Problem: Education: Goal: Knowledge of General Education information will improve Description: Including pain rating scale, medication(s)/side effects and non-pharmacologic comfort measures Outcome: Completed/Met   Problem: Activity: Goal: Risk for activity intolerance will decrease Outcome: Completed/Met   Problem: Nutrition: Goal: Adequate nutrition will be maintained Outcome: Completed/Met   Problem: Coping: Goal: Level of anxiety will decrease Outcome: Completed/Met   

## 2021-10-14 NOTE — Care Management Important Message (Signed)
Important Message  Patient Details IM Letter given to the Patient. Name: Tore Carreker MRN: [id number] Date of Birth: Aug 10, 1955   Medicare Important Message Given:  Yes     Kerin Salen 10/14/2021, 3:01 PM

## 2021-10-14 NOTE — Progress Notes (Signed)
Progress Note  10 Days Post-Op  Subjective: Pt tolerated clamping trials and continues to have bowel function. Denies abdominal pain or nausea with NGT clamping. Wants NGT out. Wife at bedside.   Objective: Vital signs in last 24 hours: Temp:  [98.7 F (37.1 C)-99.5 F (37.5 C)] 99.4 F (37.4 C) (07/20 0545) Pulse Rate:  [100-102] 102 (07/20 0545) Resp:  [15-16] 15 (07/20 0545) BP: (125-144)/(71-82) 125/71 (07/20 0545) SpO2:  [97 %-99 %] 97 % (07/20 0545) Last BM Date : 10/13/21  Intake/Output from previous day: 07/19 0701 - 07/20 0700 In: 1308.5 [P.O.:360; I.V.:865.8; IV Piggyback:82.7] Out: 1190 [Urine:375; Emesis/NG output:590; Drains:225] Intake/Output this shift: Total I/O In: -  Out: 110 [Emesis/NG output:110]  PE: Gen:  Alert, NAD, pleasant Pulm:  Normal effort Abd: abdomen distended but soft, non-tender, +BS, NGT with bilious drainage  Skin: warm and dry, no rashes  Psych: A&Ox3    Lab Results:  No results for input(s): "WBC", "HGB", "HCT", "PLT" in the last 72 hours. BMET Recent Labs    10/13/21 0352 10/14/21 0340  NA 143 140  K 3.3* 3.5  CL 113* 110  CO2 24 22  GLUCOSE 121* 115*  BUN 19 18  CREATININE 0.96 0.91  CALCIUM 8.2* 8.1*   PT/INR No results for input(s): "LABPROT", "INR" in the last 72 hours. CMP     Component Value Date/Time   NA 140 10/14/2021 0340   K 3.5 10/14/2021 0340   CL 110 10/14/2021 0340   CO2 22 10/14/2021 0340   GLUCOSE 115 (H) 10/14/2021 0340   BUN 18 10/14/2021 0340   CREATININE 0.91 10/14/2021 0340   CALCIUM 8.1 (L) 10/14/2021 0340   PROT 5.6 (L) 10/14/2021 0340   ALBUMIN 1.9 (L) 10/14/2021 0340   AST 77 (H) 10/14/2021 0340   ALT 70 (H) 10/14/2021 0340   ALKPHOS 88 10/14/2021 0340   BILITOT 0.6 10/14/2021 0340   GFRNONAA >60 10/14/2021 0340   Lipase  No results found for: "LIPASE"     Studies/Results: DG Abd Portable 1V-Small Bowel Obstruction Protocol-24 hr delay  Result Date: 10/13/2021 CLINICAL  DATA:  Small bowel obstruction. EXAM: PORTABLE ABDOMEN - 1 VIEW COMPARISON:  October 13, 2021 at 12:36 a.m. FINDINGS: Persistent dilation of small bowel loops measuring up to 4.8 cm. Normal colonic bowel gas pattern with oral contrast throughout the colon. Enteric catheter overlies the expected location of gastric body. Catheter overlies the pelvis. IMPRESSION: 1. Persistent small bowel dilation measuring up to 4.8 cm. 2. Oral contrast throughout the colon. Electronically Signed   By: Fidela Salisbury M.D.   On: 10/13/2021 18:00   DG Abd Portable 1V-Small Bowel Obstruction Protocol-initial, 8 hr delay  Result Date: 10/13/2021 CLINICAL DATA:  Small-bowel obstruction. EXAM: PORTABLE ABDOMEN - 1 VIEW COMPARISON:  10/12/2021 FINDINGS: Administered oral contrast opacifies nondistended ascending colon. Multiple gas-filled dilated loops of small bowel are again seen within the mid abdomen. Together, the findings suggest un underlying high-grade partial small bowel obstruction. No gross free intraperitoneal gas. Foley catheter balloon overlies the expected position within the mid pelvis. IMPRESSION: Findings compatible with un underlying partial small bowel obstruction. Oral contrast has reached the ascending colon. Electronically Signed   By: Fidela Salisbury M.D.   On: 10/13/2021 01:48    Anti-infectives: Anti-infectives (From admission, onward)    Start     Dose/Rate Route Frequency Ordered Stop   10/04/21 2330  ceFAZolin (ANCEF) IVPB 1 g/50 mL premix        1  g 100 mL/hr over 30 Minutes Intravenous Every 8 hours 10/04/21 1737 10/05/21 1014   10/04/21 0922  ceFAZolin (ANCEF) IVPB 2g/100 mL premix        2 g 200 mL/hr over 30 Minutes Intravenous 30 min pre-op 10/04/21 0922 10/04/21 1530   10/04/21 0000  sulfamethoxazole-trimethoprim (BACTRIM DS) 800-160 MG tablet        1 tablet Oral 2 times daily 10/04/21 1048          Assessment/Plan POD#10 S/P Robotic assisted laparoscopic radical prostatectomy  (bilateral nerve sparing) Dr. Alinda Money Bilateral robotic assisted laparoscopic pelvic lymphadenectomy Dr. Alinda Money Laparoscopic lysis of adesions, oversew of small bowel serosal injuries x2 Dr Brantley Stage    CT 7/16 with question of early SBO, ileus still seems more likely  Having some return in bowel function. NGT resumed to suction 7/16  Clinically patient seems a little improved - abdomen a little softer and more BS and having more bowel function SBO protocol started 7/18 Pt tolerated clamping trials well yesterday - remove NGT today, continue CLD, possibly advance to FLD later today  Will start to wean TPN tomorrow if continuing to improve Recommend PICC/TPN Mobilize   ID - ancef 7/10 FEN - CLD, TPN VTE - SCDs, SQH Foley - per urology     LOS: 9 days    Norm Parcel, [facility] Surgery 10/14/2021, 10:07 AM Please see Amion for pager number during day hours 7:00am-4:30pm

## 2021-10-14 NOTE — Progress Notes (Signed)
PHARMACY - TOTAL PARENTERAL NUTRITION CONSULT NOTE   Indication: Prolonged ileus  Patient Measurements: Height: '5\' 11"'$  (180.3 cm) Weight: 74.4 kg (164 lb 0.4 oz) IBW/kg (Calculated) : 75.3 TPN AdjBW (KG): 74.4 Body mass index is 22.88 kg/m. Usual Weight:   Assessment:  66 yo male with hx HTN, HLD, prostate cancer s/p LUTS. S/p robotic assisted laparoscopic radical prostatectomy, bilateral robotic assisted laparoscopic pelvic lymphadenectomy, LOA, oversew of small bowel serosal injuries on 10/04/21.  Glucose / Insulin: No hx DM.  A1c 5.0 - CBG range 142-150, rising with increased TPN rate - 3 unit SSI required Electrolytes: K low (3.5) - 75mq supplement given yesterday Renal: AKI on admit, SCr improved today Hepatic: AST/ALT elevated, AlkPhos and Tbili WNL, Alb low 1.9 Trig: WNL Intake / Output: NG clamping trials started 7/19, 400cc, drains 225cc [facility]: CTa without abscess or fluid collection but concern for small bowel obstruction. 7/18: AXR: Findings are compatible with interval worsening of reported adynamic ileus 7/19: AXR 8hr delay: Findings compatible with un underlying partial small bowel obstruction. Oral contrast has reached the ascending colon >> 24hr delay shows persistent small bowel dilation measuring up to 4.8 cm.  Oral contrast throughout the colon. GI Surgeries / Procedures:  7/10: surgery as per HPI  Central access: PICC 7/17 TPN start date: 7/17 at 18:00  Nutritional Goals: pending Goal TPN rate is 90 mL/hr (provides 112 g of protein and 2198 kcals per day)  RD Assessment: pending Estimated Needs Total Energy Estimated Needs: 2100-2300 kcal Total Protein Estimated Needs: 105-120 grams Total Fluid Estimated Needs: >/= 2.3 L/day  Current Nutrition:  Clear liquids TPN at 90 ml/hr  Plan:  Magnesium sulfate 2g IV x 1 Potassium chloride 155m IV x 3  Continue TPN at goal 9055mr at 1800 Electrolytes in TPN:  Na 55m65m,  K  55mE28m(increase), Ca 5mEq/55m Mg 6mEq/L41mncrease), Phos 15mmol/69ml:Ac 1:2 Add standard MVI and trace elements to TPN Continue Sensitive q8h SSI and adjust as needed  MIVF to KVO mL/hr at 1800 Monitor TPN labs on Mon/Thurs, CMET/Mg/Phos in AM  Adiel Mcnamara WilPeggyann Juba, BCPS Pharmacy: [phone],7:29 AM

## 2021-10-14 NOTE — Progress Notes (Signed)
10 Days Post-Op   Subjective/Chief Complaint:  1 - Moderate Risk Prostate Cancer - s/p robotic prostatectomy / node dissection 10/05/22 for pT2N0 prostate cancer with negative margins.  2 - Ileus / Adhesions - required extensive adhesiolysis as part of prostatectomy 7/10. T. Cornett gen surg assisting.  Some post-op ileus as expected initially managed with NGT. CT with possible partial SBO  3 - Nutrition - pt with prolonged ilieus and s/p major surgery. Path to TPN started 7/16.   Continues to have BMs. KUB with progression of contrast through colon. Ambulating frequently   Objective: Vital signs in last 24 hours: Temp:  [98.7 F (37.1 C)-99.5 F (37.5 C)] 99.4 F (37.4 C) (07/20 0545) Pulse Rate:  [100-102] 102 (07/20 0545) Resp:  [15-16] 15 (07/20 0545) BP: (125-144)/(71-82) 125/71 (07/20 0545) SpO2:  [97 %-99 %] 97 % (07/20 0545) Last BM Date : 10/13/21  Intake/Output from previous day: 07/19 0701 - 07/20 0700 In: 1308.5 [P.O.:360; I.V.:865.8; IV Piggyback:82.7] Out: 1000 [Urine:375; Emesis/NG output:400; Drains:225] Intake/Output this shift: No intake/output data recorded.   Very pleasant.  NGT in place, bilious output in cannister on LWS Non-labored breathing or RA. Few hiccups.  HR low 100s Increased / moderate abd distension, no r/g, non-typamic Recent port sites c/d/I JP site with dry dressing No c/c/e.  Lab Results:  No results for input(s): "WBC", "HGB", "HCT", "PLT" in the last 72 hours.  BMET Recent Labs    10/13/21 0352 10/14/21 0340  NA 143 140  K 3.3* 3.5  CL 113* 110  CO2 24 22  GLUCOSE 121* 115*  BUN 19 18  CREATININE 0.96 0.91  CALCIUM 8.2* 8.1*    PT/INR No results for input(s): "LABPROT", "INR" in the last 72 hours. ABG No results for input(s): "PHART", "HCO3" in the last 72 hours.  Invalid input(s): "PCO2", "PO2"  Studies/Results: DG Abd Portable 1V-Small Bowel Obstruction Protocol-24 hr delay  Result Date: 10/13/2021 CLINICAL  DATA:  Small bowel obstruction. EXAM: PORTABLE ABDOMEN - 1 VIEW COMPARISON:  October 13, 2021 at 12:36 a.m. FINDINGS: Persistent dilation of small bowel loops measuring up to 4.8 cm. Normal colonic bowel gas pattern with oral contrast throughout the colon. Enteric catheter overlies the expected location of gastric body. Catheter overlies the pelvis. IMPRESSION: 1. Persistent small bowel dilation measuring up to 4.8 cm. 2. Oral contrast throughout the colon. Electronically Signed   By: Fidela Salisbury M.D.   On: 10/13/2021 18:00   DG Abd Portable 1V-Small Bowel Obstruction Protocol-initial, 8 hr delay  Result Date: 10/13/2021 CLINICAL DATA:  Small-bowel obstruction. EXAM: PORTABLE ABDOMEN - 1 VIEW COMPARISON:  10/12/2021 FINDINGS: Administered oral contrast opacifies nondistended ascending colon. Multiple gas-filled dilated loops of small bowel are again seen within the mid abdomen. Together, the findings suggest un underlying high-grade partial small bowel obstruction. No gross free intraperitoneal gas. Foley catheter balloon overlies the expected position within the mid pelvis. IMPRESSION: Findings compatible with un underlying partial small bowel obstruction. Oral contrast has reached the ascending colon. Electronically Signed   By: Fidela Salisbury M.D.   On: 10/13/2021 01:48    Anti-infectives: Anti-infectives (From admission, onward)    Start     Dose/Rate Route Frequency Ordered Stop   10/04/21 2330  ceFAZolin (ANCEF) IVPB 1 g/50 mL premix        1 g 100 mL/hr over 30 Minutes Intravenous Every 8 hours 10/04/21 1737 10/05/21 1014   10/04/21 0922  ceFAZolin (ANCEF) IVPB 2g/100 mL premix  2 g 200 mL/hr over 30 Minutes Intravenous 30 min pre-op 10/04/21 0922 10/04/21 1530   10/04/21 0000  sulfamethoxazole-trimethoprim (BACTRIM DS) 800-160 MG tablet        1 tablet Oral 2 times daily 10/04/21 1048         Assessment/Plan:  1 - Moderate Risk Prostate Cancer - great prognosis, no further  cancer directed care this admission.   2 - Ileus / Adhesions - appreciate gen surg comanagement. Will touch base with them regarding CT findings and further recommendations  3 - Nutrition - continue PICC and TPN. Anticipate clamp trials today per general surgery and repeat KUB to evaluate continued progression of contrast  [name]

## 2021-10-15 LAB — GLUCOSE, CAPILLARY
Glucose-Capillary: 134 mg/dL — ABNORMAL HIGH (ref 70–99)
Glucose-Capillary: 149 mg/dL — ABNORMAL HIGH (ref 70–99)
Glucose-Capillary: 147 mg/dL — ABNORMAL HIGH (ref 70–99)

## 2021-10-15 LAB — COMPREHENSIVE METABOLIC PANEL
ALT: 179 U/L — ABNORMAL HIGH (ref 0–44)
AST: 148 U/L — ABNORMAL HIGH (ref 15–41)
Albumin: 2 g/dL — ABNORMAL LOW (ref 3.5–5.0)
Alkaline Phosphatase: 110 U/L (ref 38–126)
Anion gap: 7 (ref 5–15)
BUN: 19 mg/dL (ref 8–23)
CO2: 22 mmol/L (ref 22–32)
Calcium: 7.9 mg/dL — ABNORMAL LOW (ref 8.9–10.3)
Chloride: 106 mmol/L (ref 98–111)
Creatinine, Ser: 0.82 mg/dL (ref 0.61–1.24)
GFR, Estimated: >60 mL/min (ref >60)
Glucose, Bld: 111 mg/dL — ABNORMAL HIGH (ref 70–99)
Potassium: 3.9 mmol/L (ref 3.5–5.1)
Sodium: 135 mmol/L (ref 135–145)
Total Bilirubin: 0.6 mg/dL (ref 0.3–1.2)
Total Protein: 5.9 g/dL — ABNORMAL LOW (ref 6.5–8.1)

## 2021-10-15 LAB — PHOSPHORUS: Phosphorus: 3 mg/dL (ref 2.5–4.6)

## 2021-10-15 LAB — MAGNESIUM: Magnesium: 2.1 mg/dL (ref 1.7–2.4)

## 2021-10-15 MED ORDER — ENSURE SURGERY PO LIQD
237.0000 mL | Freq: Three times a day (TID) | ORAL | Status: DC
  Administered 2021-10-15 (×2): 237 mL via ORAL
  Filled 2021-10-15 (×18): qty 237

## 2021-10-15 MED ORDER — TRAVASOL 10 % IV SOLN
INTRAVENOUS | Status: AC
  Filled 2021-10-15: qty 499.2

## 2021-10-15 NOTE — Progress Notes (Signed)
Progress Note  11 Days Post-Op  Subjective: Pt tolerated FLD and having bowel movements still. Happy to be rid of NGT. Ambulating a lot. Hoping to get foley out today as well.   Objective: Vital signs in last 24 hours: Temp:  [98.4 F (36.9 C)-99.2 F (37.3 C)] 98.4 F (36.9 C) (07/21 0548) Pulse Rate:  [102-105] 102 (07/21 0548) Resp:  [15-16] 15 (07/21 0548) BP: (121)/(71-82) 121/71 (07/21 0548) SpO2:  [96 %-100 %] 96 % (07/21 0548) Last BM Date : 10/15/21  Intake/Output from previous day: 07/20 0701 - 07/21 0700 In: 6160 [P.O.:600; I.V.:753; IV Piggyback:200] Out: 2020 [Urine:1675; Emesis/NG output:110; Drains:235] Intake/Output this shift: Total I/O In: 240 [P.O.:240] Out: 550 [Urine:550]  PE: Gen:  Alert, NAD, pleasant Pulm:  Normal effort Abd: abdomen distended but soft, non-tender, +BS  Skin: warm and dry, no rashes  Psych: A&Ox3    Lab Results:  No results for input(s): "WBC", "HGB", "HCT", "PLT" in the last 72 hours. BMET Recent Labs    10/14/21 0340 10/15/21 0212  NA 140 135  K 3.5 3.9  CL 110 106  CO2 22 22  GLUCOSE 115* 111*  BUN 18 19  CREATININE 0.91 0.82  CALCIUM 8.1* 7.9*    PT/INR No results for input(s): "LABPROT", "INR" in the last 72 hours. CMP     Component Value Date/Time   NA 135 10/15/2021 0212   K 3.9 10/15/2021 0212   CL 106 10/15/2021 0212   CO2 22 10/15/2021 0212   GLUCOSE 111 (H) 10/15/2021 0212   BUN 19 10/15/2021 0212   CREATININE 0.82 10/15/2021 0212   CALCIUM 7.9 (L) 10/15/2021 0212   PROT 5.9 (L) 10/15/2021 0212   ALBUMIN 2.0 (L) 10/15/2021 0212   AST 148 (H) 10/15/2021 0212   ALT 179 (H) 10/15/2021 0212   ALKPHOS 110 10/15/2021 0212   BILITOT 0.6 10/15/2021 0212   GFRNONAA >60 10/15/2021 0212   Lipase  No results found for: "LIPASE"     Studies/Results: DG Abd Portable 1V-Small Bowel Obstruction Protocol-24 hr delay  Result Date: 10/13/2021 CLINICAL DATA:  Small bowel obstruction. EXAM: PORTABLE  ABDOMEN - 1 VIEW COMPARISON:  October 13, 2021 at 12:36 a.m. FINDINGS: Persistent dilation of small bowel loops measuring up to 4.8 cm. Normal colonic bowel gas pattern with oral contrast throughout the colon. Enteric catheter overlies the expected location of gastric body. Catheter overlies the pelvis. IMPRESSION: 1. Persistent small bowel dilation measuring up to 4.8 cm. 2. Oral contrast throughout the colon. Electronically Signed   By: Fidela Salisbury M.D.   On: 10/13/2021 18:00    Anti-infectives: Anti-infectives (From admission, onward)    Start     Dose/Rate Route Frequency Ordered Stop   10/14/21 1430  sulfamethoxazole-trimethoprim (BACTRIM DS) 800-160 MG per tablet 1 tablet        1 tablet Oral Every 12 hours 10/14/21 1332 10/17/21 0959   10/04/21 2330  ceFAZolin (ANCEF) IVPB 1 g/50 mL premix        1 g 100 mL/hr over 30 Minutes Intravenous Every 8 hours 10/04/21 1737 10/05/21 1014   10/04/21 0922  ceFAZolin (ANCEF) IVPB 2g/100 mL premix        2 g 200 mL/hr over 30 Minutes Intravenous 30 min pre-op 10/04/21 0922 10/04/21 1530   10/04/21 0000  sulfamethoxazole-trimethoprim (BACTRIM DS) 800-160 MG tablet        1 tablet Oral 2 times daily 10/04/21 1048  Assessment/Plan POD#11 S/P Robotic assisted laparoscopic radical prostatectomy (bilateral nerve sparing) Dr. Alinda Money Bilateral robotic assisted laparoscopic pelvic lymphadenectomy Dr. Alinda Money Laparoscopic lysis of adesions, oversew of small bowel serosal injuries x2 Dr Brantley Stage    CT 7/16 with question of early SBO, ileus still seems more likely  Having some return in bowel function. NGT resumed to suction 7/16  Clinically improving Advance to low fiber diet and wean TPN - if able to stop TPN over the weekend and pt tolerating soft diet then he could likely go home over the weekend from a general surgery standpoint. He can follow up with [facility] prn but no specific follow up needed.  Mobilize   ID - ancef 7/10 FEN - soft  diet, wean TPN VTE - SCDs, SQH Foley - per urology     LOS: 10 days    Norm Parcel, [facility] Surgery 10/15/2021, 10:04 AM Please see Amion for pager number during day hours 7:00am-4:30pm

## 2021-10-15 NOTE — Plan of Care (Signed)
  Problem: Health Behavior/Discharge Planning: Goal: Ability to manage health-related needs will improve Outcome: Completed/Met   Problem: Clinical Measurements: Goal: Respiratory complications will improve Outcome: Completed/Met   Problem: Elimination: Goal: Will not experience complications related to bowel motility Outcome: Progressing   Problem: Pain Managment: Goal: General experience of comfort will improve Outcome: Completed/Met

## 2021-10-15 NOTE — Progress Notes (Signed)
11 Days Post-Op   Subjective/Chief Complaint:  1 - Moderate Risk Prostate Cancer - s/p robotic prostatectomy / node dissection 10/05/22 for pT2N0 prostate cancer with negative margins.  2 - Ileus / Adhesions - required extensive adhesiolysis as part of prostatectomy 7/10. T. Cornett gen surg assisting.  Post-op ileus managed with NG tube which was removed yesterday and has tolerated diet well  3 - Nutrition - pt with prolonged ilieus and s/p major surgery. Path to TPN started 7/16. Now taking PO without issue   No acute events overnight. NG removed, continues to pass flatus and have bowel movements. Tolerated full liquids well yesterday without nausea, abdominal exam soft and non-distended. Remains low grade tachycardia (low 100s) but overall improving from prior  Objective: Vital signs in last 24 hours: Temp:  [98.4 F (36.9 C)-99.2 F (37.3 C)] 98.4 F (36.9 C) (07/21 0548) Pulse Rate:  [102-105] 102 (07/21 0548) Resp:  [15-16] 15 (07/21 0548) BP: (121)/(71-82) 121/71 (07/21 0548) SpO2:  [96 %-100 %] 96 % (07/21 0548) Last BM Date : 10/13/21  Intake/Output from previous day: 07/20 0701 - 07/21 0700 In: 8315 [P.O.:600; I.V.:753; IV Piggyback:200] Out: 2020 [Urine:1675; Emesis/NG output:110; Drains:235] Intake/Output this shift: No intake/output data recorded.   Very pleasant.  Non-labored breathing or RA.  HR low 100s Abdomen soft/non-tender, no r/g, non-typamic Recent port sites c/d/I JP site with small amount of sanguinous leakage  Lab Results:  No results for input(s): "WBC", "HGB", "HCT", "PLT" in the last 72 hours.  BMET Recent Labs    10/14/21 0340 10/15/21 0212  NA 140 135  K 3.5 3.9  CL 110 106  CO2 22 22  GLUCOSE 115* 111*  BUN 18 19  CREATININE 0.91 0.82  CALCIUM 8.1* 7.9*    PT/INR No results for input(s): "LABPROT", "INR" in the last 72 hours. ABG No results for input(s): "PHART", "HCO3" in the last 72 hours.  Invalid input(s): "PCO2",  "PO2"  Studies/Results: DG Abd Portable 1V-Small Bowel Obstruction Protocol-24 hr delay  Result Date: 10/13/2021 CLINICAL DATA:  Small bowel obstruction. EXAM: PORTABLE ABDOMEN - 1 VIEW COMPARISON:  October 13, 2021 at 12:36 a.m. FINDINGS: Persistent dilation of small bowel loops measuring up to 4.8 cm. Normal colonic bowel gas pattern with oral contrast throughout the colon. Enteric catheter overlies the expected location of gastric body. Catheter overlies the pelvis. IMPRESSION: 1. Persistent small bowel dilation measuring up to 4.8 cm. 2. Oral contrast throughout the colon. Electronically Signed   By: Fidela Salisbury M.D.   On: 10/13/2021 18:00    Anti-infectives: Anti-infectives (From admission, onward)    Start     Dose/Rate Route Frequency Ordered Stop   10/14/21 1430  sulfamethoxazole-trimethoprim (BACTRIM DS) 800-160 MG per tablet 1 tablet        1 tablet Oral Every 12 hours 10/14/21 1332 10/17/21 0959   10/04/21 2330  ceFAZolin (ANCEF) IVPB 1 g/50 mL premix        1 g 100 mL/hr over 30 Minutes Intravenous Every 8 hours 10/04/21 1737 10/05/21 1014   10/04/21 0922  ceFAZolin (ANCEF) IVPB 2g/100 mL premix        2 g 200 mL/hr over 30 Minutes Intravenous 30 min pre-op 10/04/21 0922 10/04/21 1530   10/04/21 0000  sulfamethoxazole-trimethoprim (BACTRIM DS) 800-160 MG tablet        1 tablet Oral 2 times daily 10/04/21 1048         Assessment/Plan:  1 - Moderate Risk Prostate Cancer - great prognosis,  no further cancer directed care this admission. Received peri-catheter pull dose of antibiotics yesterday, plan for voiding trial today  2 - Ileus / Adhesions - appreciate gen surg comanagement. Continue full liquid diet for this AM, anticipate advancement later today after evaluation by general surgery  3 - Nutrition - Continue TPN for now but expect we can begin to wean since he is tolerating PO well  [name] 10/15/2021

## 2021-10-15 NOTE — Plan of Care (Signed)
  Problem: Health Behavior/Discharge Planning: Goal: Ability to manage health-related needs will improve Outcome: Completed/Met   Problem: Clinical Measurements: Goal: Respiratory complications will improve Outcome: Completed/Met   Problem: Elimination: Goal: Will not experience complications related to bowel motility Outcome: Progressing   Problem: Pain Managment: Goal: General experience of comfort will improve Outcome: Completed/Met   

## 2021-10-15 NOTE — Progress Notes (Signed)
PHARMACY - TOTAL PARENTERAL NUTRITION CONSULT NOTE   Indication: Prolonged ileus  Patient Measurements: Height: '5\' 11"'$  (180.3 cm) Weight: 74.4 kg (164 lb 0.4 oz) IBW/kg (Calculated) : 75.3 TPN AdjBW (KG): 74.4 Body mass index is 22.88 kg/m. Usual Weight:   Assessment:  66 yo male with hx HTN, HLD, prostate cancer s/p LUTS. S/p robotic assisted laparoscopic radical prostatectomy, bilateral robotic assisted laparoscopic pelvic lymphadenectomy, LOA, oversew of small bowel serosal injuries on 10/04/21.  Glucose / Insulin: No hx DM.  A1c 5.0 - CBG range 134-144, within goal of < 150 - 3 unit SSI required Electrolytes: K improved after 90mq supplement given yesterday; Mg WNL after 2g given yesterday Renal: AKI on admit, SCr improved to WNL Hepatic: AST/ALT elevated and rising, AlkPhos and Tbili WNL, Alb low  Trig: WNL Intake / Output: NG removed 7/20, drain 235cc [place] [facility]: CTa without abscess or fluid collection but concern for small bowel obstruction. 7/18: AXR: Findings are compatible with interval worsening of reported adynamic ileus 7/19: AXR 8hr delay: Findings compatible with un underlying partial small bowel obstruction. Oral contrast has reached the ascending colon >> 24hr delay shows persistent small bowel dilation measuring up to 4.8 cm.  Oral contrast throughout the colon. GI Surgeries / Procedures:  7/10: surgery as per HPI  Central access: PICC 7/17 TPN start date: 7/17 at 18:00  Nutritional Goals: pending Goal TPN rate is 90 mL/hr (provides 112 g of protein and 2198 kcals per day)  RD Assessment: pending Estimated Needs Total Energy Estimated Needs: 2100-2300 kcal Total Protein Estimated Needs: 105-120 grams Total Fluid Estimated Needs: >/= 2.3 L/day  Current Nutrition:  Full liquids started 7/20 TPN at 90 ml/hr  Plan:  Magnesium sulfate 2g IV x 1 Potassium chloride 118m IV x 3  TPN to 1/2 rate today (4087mr), then off  tomorrow Electrolytes in TPN:  Na 35m73m,  K 60mE71m(increase), Ca 5mEq/95m Mg 6mEq/L36mhos 15mmol/51ml:Ac 1:2 Add standard MVI and trace elements to TPN Continue Sensitive q8h SSI and adjust as needed  MIVF to KVO mL/hr at 1800 Monitor TPN labs on Mon/Thurs  Ivan Devito WilPeggyann Lopez, BCPS Pharmacy: [phone],7:01 AM

## 2021-10-16 LAB — GLUCOSE, CAPILLARY
Glucose-Capillary: 135 mg/dL — ABNORMAL HIGH (ref 70–99)
Glucose-Capillary: 142 mg/dL — ABNORMAL HIGH (ref 70–99)
Glucose-Capillary: 128 mg/dL — ABNORMAL HIGH (ref 70–99)

## 2021-10-16 MED ORDER — PROCHLORPERAZINE EDISYLATE 10 MG/2ML IJ SOLN
10.0000 mg | Freq: Four times a day (QID) | INTRAMUSCULAR | Status: DC | PRN
  Administered 2021-10-16 – 2021-10-17 (×3): 10 mg via INTRAVENOUS
  Filled 2021-10-16 (×3): qty 2

## 2021-10-16 MED ORDER — BISACODYL 10 MG RE SUPP
10.0000 mg | Freq: Every day | RECTAL | Status: DC
Start: 2021-10-16 — End: 2021-10-20
  Administered 2021-10-16 – 2021-10-19 (×4): 10 mg via RECTAL
  Filled 2021-10-16 (×3): qty 1

## 2021-10-16 MED ORDER — SODIUM CHLORIDE 0.9 % IV SOLN: INTRAVENOUS | Status: AC

## 2021-10-16 NOTE — Progress Notes (Signed)
12 Days Post-Op   Subjective/Chief Complaint: Patient just had an episode of vomiting Bowel movement with a lot of flatus yesterday Still feels distended   Objective: Vital signs in last 24 hours: Temp:  [98.5 F (36.9 C)-99.3 F (37.4 C)] 99.1 F (37.3 C) (07/22 0425) Pulse Rate:  [97-111] 100 (07/22 0425) Resp:  [17-19] 17 (07/22 0425) BP: (113-129)/(68-77) 122/77 (07/22 0425) SpO2:  [90 %-100 %] 98 % (07/22 0425) Last BM Date : 10/15/21  Intake/Output from previous day: 07/21 0701 - 07/22 0700 In: 2766.4 [P.O.:240; I.V.:2526.4] Out: 550 [Urine:550] Intake/Output this shift: No intake/output data recorded.  WDWN in ABD  Abd - soft, non-tender; mildly distended  Lab Results:  No results for input(s): "WBC", "HGB", "HCT", "PLT" in the last 72 hours. BMET Recent Labs    10/14/21 0340 10/15/21 0212  NA 140 135  K 3.5 3.9  CL 110 106  CO2 22 22  GLUCOSE 115* 111*  BUN 18 19  CREATININE 0.91 0.82  CALCIUM 8.1* 7.9*   PT/INR No results for input(s): "LABPROT", "INR" in the last 72 hours. ABG No results for input(s): "PHART", "HCO3" in the last 72 hours.  Invalid input(s): "PCO2", "PO2"  Studies/Results: No results found.  Anti-infectives: Anti-infectives (From admission, onward)    Start     Dose/Rate Route Frequency Ordered Stop   10/14/21 1430  sulfamethoxazole-trimethoprim (BACTRIM DS) 800-160 MG per tablet 1 tablet        1 tablet Oral Every 12 hours 10/14/21 1332 10/17/21 0959   10/04/21 2330  ceFAZolin (ANCEF) IVPB 1 g/50 mL premix        1 g 100 mL/hr over 30 Minutes Intravenous Every 8 hours 10/04/21 1737 10/05/21 1014   10/04/21 0922  ceFAZolin (ANCEF) IVPB 2g/100 mL premix        2 g 200 mL/hr over 30 Minutes Intravenous 30 min pre-op 10/04/21 0922 10/04/21 1530   10/04/21 0000  sulfamethoxazole-trimethoprim (BACTRIM DS) 800-160 MG tablet        1 tablet Oral 2 times daily 10/04/21 1048         Assessment/Plan: POD#11 S/P Robotic  assisted laparoscopic radical prostatectomy (bilateral nerve sparing) Dr. Alinda Money Bilateral robotic assisted laparoscopic pelvic lymphadenectomy Dr. Alinda Money Laparoscopic lysis of adesions, oversew of small bowel serosal injuries x2 Dr Brantley Stage    Post-operative ileus NG tube out Scheduled Reglan Daily Dulcolax suppository  Encourage ambulation  Would not discharge today.  Possibly ready tomorrow.    He can follow up with [facility] prn but no specific follow up needed.  Mobilize   ID - ancef 7/10 FEN - soft diet, wean TPN VTE - SCDs, SQH Foley - per urology     LOS: 11 days    Maia Petties 10/16/2021

## 2021-10-16 NOTE — Progress Notes (Signed)
Urology Inpatient Progress Report  Prostate cancer ([place]) [C61]  Procedure(s): XI ROBOTIC ASSISTED LAPAROSCOPIC RADICAL PROSTATECTOMY LEVEL 3 LYMPHADENECTOMY, PELVIC LAPAROSCOPIC LYSIS OF ADHESIONS  12 Days Post-Op   Intv/Subj: No acute events overnight. Patient is without complaint.  Had a little bit of nausea but no vomiting.  Tolerating diet.  Abdomen soft and nontender.  Principal Problem:   Prostate cancer ([place])  Current Facility-Administered Medications  Medication Dose Route Frequency Provider Last Rate Last Admin   0.9 %  sodium chloride infusion   Intravenous Continuous Emiliano Dyer, RPH 10 mL/hr at 10/15/21 2139 New Bag at 10/15/21 2139   bisacodyl (DULCOLAX) suppository 10 mg  10 mg Rectal Daily PRN Norm Parcel, PA-C   10 mg at 10/14/21 2126   Chlorhexidine Gluconate Cloth 2 % PADS 6 each  6 each Topical Daily Raynelle Bring, MD   6 each at 10/15/21 1100   diphenhydrAMINE (BENADRYL) injection 12.5-25 mg  12.5-25 mg Intravenous Q6H PRN Raynelle Bring, MD       Or   diphenhydrAMINE (BENADRYL) 12.5 MG/5ML elixir 12.5-25 mg  12.5-25 mg Oral Q6H PRN Raynelle Bring, MD       diphenhydrAMINE (BENADRYL) injection 25 mg  25 mg Intravenous QHS PRN Norm Parcel, PA-C   25 mg at 10/15/21 2155   feeding supplement (ENSURE SURGERY) liquid 237 mL  237 mL Oral TID BM Norm Parcel, PA-C   237 mL at 10/15/21 2000   heparin injection 5,000 Units  5,000 Units Subcutaneous Q8H [name], MD   5,000 Units at 10/16/21 0529   hyoscyamine (LEVSIN SL) SL tablet 0.125 mg  0.125 mg Sublingual Q6H PRN Raynelle Bring, MD       insulin aspart (novoLOG) injection 0-9 Units  0-9 Units Subcutaneous Q8H Emiliano Dyer, RPH   1 Units at 10/16/21 0543   metoCLOPramide (REGLAN) injection 10 mg  10 mg Intravenous Q6H Alexis Frock, MD   10 mg at 10/16/21 0528   morphine (PF) 2 MG/ML injection 2-4 mg  2-4 mg Intravenous Q3H PRN Norm Parcel, PA-C        neomycin-bacitracin-polymyxin [id number] OINT 1 Application  1 Application Topical TID PRN Raynelle Bring, MD       ondansetron [facility]) injection 4 mg  4 mg Intravenous Q4H PRN Raynelle Bring, MD   4 mg at 10/16/21 8127   Oral care mouth rinse  15 mL Mouth Rinse PRN Raynelle Bring, MD       pantoprazole (PROTONIX) injection 40 mg  40 mg Intravenous Q24H Alexis Frock, MD   40 mg at 10/15/21 2130   phenol (CHLORASEPTIC) mouth spray 1 spray  1 spray Mouth/Throat PRN Meuth, Brooke A, PA-C   1 spray at 10/08/21 1605   sodium chloride flush (NS) 0.9 % injection 10-40 mL  10-40 mL Intracatheter PRN Raynelle Bring, MD   10 mL at 10/16/21 0411   sulfamethoxazole-trimethoprim (BACTRIM DS) 800-160 MG per tablet 1 tablet  1 tablet Oral Q12H [name], MD   1 tablet at 10/15/21 2130   TPN ADULT (ION)   Intravenous Continuous TPN Emiliano Dyer, RPH 40 mL/hr at 10/15/21 1723 New Bag at 10/15/21 1723     Objective: Vital: Vitals:   10/15/21 1430 10/15/21 1831 10/15/21 2043 10/16/21 0425  BP:  122/68 113/71 122/77  Pulse: 100 (!) 111 97 100  Resp:  '19 17 17  '$ Temp:  99.3 F (37.4 C) 99 F (37.2 C) 99.1 F (37.3 C)  TempSrc:   Oral Oral  SpO2:  99% 90% 98%  Weight:      Height:       I/Os: I/O last 3 completed shifts: In: 2766.4 [P.O.:240; I.V.:2526.4] Out: 1450 [Urine:1350; Drains:100]  Physical Exam:  General: Patient is in no apparent distress Lungs: Normal respiratory effort, chest expands symmetrically. GI: The abdomen is soft and nontender without mass. Ext: lower extremities symmetric  Lab Results: No results for input(s): "WBC", "HGB", "HCT" in the last 72 hours. Recent Labs    10/14/21 0340 10/15/21 0212  NA 140 135  K 3.5 3.9  CL 110 106  CO2 22 22  GLUCOSE 115* 111*  BUN 18 19  CREATININE 0.91 0.82  CALCIUM 8.1* 7.9*   No results for input(s): "LABPT", "INR" in the last 72 hours. No results for input(s): "LABURIN" in the last 72 hours. No results found  for this or any previous visit.  Studies/Results: No results found.  Assessment: Moderate risk prostate cancer Ileus  Procedure(s): XI ROBOTIC ASSISTED LAPAROSCOPIC RADICAL PROSTATECTOMY LEVEL 3 LYMPHADENECTOMY, PELVIC LAPAROSCOPIC LYSIS OF ADHESIONS, 12 Days Post-Op  doing well.  Plan: Patient now tolerating diet.  Had a little bit of nausea but having good flatus and a little bit of a bowel movement yesterday.  We will check with general surgery if okay to go home.  Okay from my standpoint.   Link Snuffer, MD Urology 10/16/2021, 7:02 AM

## 2021-10-16 NOTE — Progress Notes (Signed)
PHARMACY - TOTAL PARENTERAL NUTRITION CONSULT NOTE   Indication: Prolonged ileus  Patient Measurements: Height: '5\' 11"'$  (180.3 cm) Weight: 74.4 kg (164 lb 0.4 oz) IBW/kg (Calculated) : 75.3 TPN AdjBW (KG): 74.4 Body mass index is 22.88 kg/m. Usual Weight:   Assessment:  66 yo male with hx HTN, HLD, prostate cancer s/p LUTS. S/p robotic assisted laparoscopic radical prostatectomy, bilateral robotic assisted laparoscopic pelvic lymphadenectomy, LOA, oversew of small bowel serosal injuries on 10/04/21.  Glucose / Insulin: CBGs well controlled Electrolytes: no labs today; WNL yesterday Renal: AKI on admit, SCr improved to WNL Hepatic: AST/ALT elevated and rising, AlkPhos and Tbili WNL, Alb low  Trig: WNL Intake / Output:  MIVF: NS at [facility] GI Imaging: 7/16: CTa without abscess or fluid collection but concern for small bowel obstruction. 7/18: AXR: Findings are compatible with interval worsening of reported adynamic ileus 7/19: AXR 8hr delay: Findings compatible with un underlying partial small bowel obstruction. Oral contrast has reached the ascending colon >> 24hr delay shows persistent small bowel dilation measuring up to 4.8 cm.  Oral contrast throughout the colon. GI Surgeries / Procedures:  7/10: surgery as per HPI  Central access: PICC 7/17 TPN start date: 7/17 at 18:00  Nutritional Goals: pending Goal TPN rate is 90 mL/hr (provides 112 g of protein and 2198 kcals per day)  RD Assessment: pending Estimated Needs Total Energy Estimated Needs: 2100-2300 kcal Total Protein Estimated Needs: 105-120 grams Total Fluid Estimated Needs: >/= 2.3 L/day  Current Nutrition:  Full liquids started 7/20 TPN at 40 ml/hr  Plan:  Complete TPN bag running at 40 ml/hr today No further TPN starting tonight  Reuel Boom, PharmD, BCPS [phone] 10/16/2021, 10:35 AM

## 2021-10-17 ENCOUNTER — Inpatient Hospital Stay (HOSPITAL_COMMUNITY): Payer: Medicare Other

## 2021-10-17 LAB — BASIC METABOLIC PANEL WITH GFR
Anion gap: 13 (ref 5–15)
BUN: 48 mg/dL — ABNORMAL HIGH (ref 8–23)
CO2: 19 mmol/L — ABNORMAL LOW (ref 22–32)
Calcium: 8.3 mg/dL — ABNORMAL LOW (ref 8.9–10.3)
Chloride: 101 mmol/L (ref 98–111)
Creatinine, Ser: 1.93 mg/dL — ABNORMAL HIGH (ref 0.61–1.24)
GFR, Estimated: 38 mL/min — ABNORMAL LOW
Glucose, Bld: 126 mg/dL — ABNORMAL HIGH (ref 70–99)
Potassium: 5.4 mmol/L — ABNORMAL HIGH (ref 3.5–5.1)
Sodium: 133 mmol/L — ABNORMAL LOW (ref 135–145)

## 2021-10-17 LAB — GLUCOSE, CAPILLARY
Glucose-Capillary: 134 mg/dL — ABNORMAL HIGH (ref 70–99)
Glucose-Capillary: 161 mg/dL — ABNORMAL HIGH (ref 70–99)
Glucose-Capillary: 146 mg/dL — ABNORMAL HIGH (ref 70–99)

## 2021-10-17 MED ORDER — IOHEXOL 300 MG/ML  SOLN
100.0000 mL | Freq: Once | INTRAMUSCULAR | Status: AC | PRN
  Administered 2021-10-17: 100 mL via INTRAVENOUS

## 2021-10-17 MED ORDER — SIMETHICONE 40 MG/0.6ML PO SUSP
40.0000 mg | Freq: Four times a day (QID) | ORAL | Status: DC | PRN
Start: 2021-10-17 — End: 2021-11-06

## 2021-10-17 MED ORDER — IOHEXOL 9 MG/ML PO SOLN
ORAL | Status: AC
  Administered 2021-10-17: 500 mL
  Filled 2021-10-17: qty 1000

## 2021-10-17 MED ORDER — SODIUM CHLORIDE (PF) 0.9 % IJ SOLN
INTRAMUSCULAR | Status: AC
  Filled 2021-10-17: qty 50

## 2021-10-17 NOTE — Progress Notes (Signed)
Urology Inpatient Progress Report  Prostate cancer ([place]) [C61]  Procedure(s): XI ROBOTIC ASSISTED LAPAROSCOPIC RADICAL PROSTATECTOMY LEVEL 3 LYMPHADENECTOMY, PELVIC LAPAROSCOPIC LYSIS OF ADHESIONS  13 Days Post-Op   Intv/Subj: Multiple bouts of emesis yesterday. More distended. Afebrile and HDS but persistent low grade tachycardia. Evaluated by general surgery and diet regressed to clears, CT ordered.  Principal Problem:   Prostate cancer ([place])  Current Facility-Administered Medications  Medication Dose Route Frequency Provider Last Rate Last Admin   0.9 %  sodium chloride infusion   Intravenous Continuous Emiliano Dyer, RPH 10 mL/hr at 10/15/21 2139 New Bag at 10/15/21 2139   0.9 %  sodium chloride infusion   Intravenous Continuous Marton Redwood III, MD 50 mL/hr at 10/16/21 1830 New Bag at 10/16/21 1830   bisacodyl (DULCOLAX) suppository 10 mg  10 mg Rectal Daily Donnie Mesa, MD   10 mg at 10/16/21 0932   Chlorhexidine Gluconate Cloth 2 % PADS 6 each  6 each Topical Daily Raynelle Bring, MD   6 each at 10/16/21 [phone]   diphenhydrAMINE (BENADRYL) injection 12.5-25 mg  12.5-25 mg Intravenous Q6H PRN Raynelle Bring, MD       Or   diphenhydrAMINE (BENADRYL) 12.5 MG/5ML elixir 12.5-25 mg  12.5-25 mg Oral Q6H PRN Raynelle Bring, MD       diphenhydrAMINE (BENADRYL) injection 25 mg  25 mg Intravenous QHS PRN Norm Parcel, PA-C   25 mg at 10/15/21 2155   feeding supplement (ENSURE SURGERY) liquid 237 mL  237 mL Oral TID BM Norm Parcel, PA-C   237 mL at 10/15/21 2000   heparin injection 5,000 Units  5,000 Units Subcutaneous Q8H [name], MD   5,000 Units at 10/17/21 7628   hyoscyamine (LEVSIN SL) SL tablet 0.125 mg  0.125 mg Sublingual Q6H PRN Raynelle Bring, MD       insulin aspart (novoLOG) injection 0-9 Units  0-9 Units Subcutaneous Q8H Emiliano Dyer, [facility]   1 Units at 10/17/21 3151   metoCLOPramide (REGLAN) injection 10 mg  10 mg Intravenous Q6H Alexis Frock,  MD   10 mg at 10/17/21 0651   morphine (PF) 2 MG/ML injection 2-4 mg  2-4 mg Intravenous Q3H PRN Norm Parcel, PA-C       neomycin-bacitracin-polymyxin [phone] OINT 1 Application  1 Application Topical TID PRN Raynelle Bring, MD       ondansetron [facility]) injection 4 mg  4 mg Intravenous Q4H PRN Raynelle Bring, MD   4 mg at 10/16/21 1744   Oral care mouth rinse  15 mL Mouth Rinse PRN Raynelle Bring, MD       pantoprazole (PROTONIX) injection 40 mg  40 mg Intravenous Q24H Alexis Frock, MD   40 mg at 10/16/21 2152   phenol (CHLORASEPTIC) mouth spray 1 spray  1 spray Mouth/Throat PRN Meuth, Brooke A, PA-C   1 spray at 10/08/21 1605   prochlorperazine (COMPAZINE) injection 10 mg  10 mg Intravenous Q6H PRN Marton Redwood III, MD   10 mg at 10/17/21 0758   simethicone (MYLICON) 40 GG/2.6RS suspension 40 mg  40 mg Oral Q6H PRN Donnie Mesa, MD       sodium chloride flush (NS) 0.9 % injection 10-40 mL  10-40 mL Intracatheter PRN Raynelle Bring, MD   10 mL at 10/16/21 0411     Objective: Vital: Vitals:   10/16/21 0425 10/16/21 1404 10/16/21 1945 10/17/21 0636  BP: 122/77 135/88 106/77 118/83  Pulse: 100 (!) 112 (!) 109 Marland Kitchen)  113  Resp: '17  16 16  '$ Temp: 99.1 F (37.3 C) 98.4 F (36.9 C) 98.8 F (37.1 C) 98.4 F (36.9 C)  TempSrc: Oral   Oral  SpO2: 98% 100% 99% 98%  Weight:      Height:       I/Os: I/O last 3 completed shifts: In: 2040.5 [P.O.:360; I.V.:1680.5] Out: 1200 [Emesis/NG output:1200]  Physical Exam:  General: Patient is in no apparent distress Lungs: Normal respiratory effort, chest expands symmetrically. GI: The abdomen is soft, distended Ext: lower extremities symmetric  Lab Results: No results for input(s): "WBC", "HGB", "HCT" in the last 72 hours. Recent Labs    10/15/21 0212  NA 135  K 3.9  CL 106  CO2 22  GLUCOSE 111*  BUN 19  CREATININE 0.82  CALCIUM 7.9*    No results for input(s): "LABPT", "INR" in the last 72 hours. No results for  input(s): "LABURIN" in the last 72 hours. No results found for this or any previous visit.  Studies/Results: No results found.  Assessment: Moderate risk prostate cancer Ileus  Procedure(s): XI ROBOTIC ASSISTED LAPAROSCOPIC RADICAL PROSTATECTOMY LEVEL 3 LYMPHADENECTOMY, PELVIC LAPAROSCOPIC LYSIS OF ADHESIONS, 13 Days Post-Op  doing well.  Plan: - CT scan per generally surgery, appreciate their input and assistance - Continue clear liquid diet

## 2021-10-17 NOTE — Progress Notes (Signed)
Contacted MD on call for surgery to clarify order for [place] tube and per Dr Leighton Ruff there is not an order for NGT in Epic so the order for [facility] tube is used but it is for NGT insertion, charge RN updated

## 2021-10-17 NOTE — Progress Notes (Signed)
13 Days Post-Op   Subjective/Chief Complaint: Small bowel movement/ flatus after suppository Still feels quite distended Had a couple of episodes of vomiting yesterday Weaned off TNA yesterday   Objective: Vital signs in last 24 hours: Temp:  [98.4 F (36.9 C)-98.8 F (37.1 C)] 98.4 F (36.9 C) (07/23 0636) Pulse Rate:  [109-113] 113 (07/23 0636) Resp:  [16] 16 (07/23 0636) BP: (106-135)/(77-88) 118/83 (07/23 0636) SpO2:  [98 %-100 %] 98 % (07/23 0636) Last BM Date : 10/16/21  Intake/Output from previous day: 07/22 0701 - 07/23 0700 In: 1616.5 [P.O.:360; I.V.:1256.5] Out: 1200 [Emesis/NG output:1200] Intake/Output this shift: No intake/output data recorded.  WDWN in NAD Abd - more distended with some mild vague tenderness  Lab Results:  No results for input(s): "WBC", "HGB", "HCT", "PLT" in the last 72 hours. BMET Recent Labs    10/15/21 0212  NA 135  K 3.9  CL 106  CO2 22  GLUCOSE 111*  BUN 19  CREATININE 0.82  CALCIUM 7.9*   PT/INR No results for input(s): "LABPROT", "INR" in the last 72 hours. ABG No results for input(s): "PHART", "HCO3" in the last 72 hours.  Invalid input(s): "PCO2", "PO2"  Studies/Results: No results found.  Anti-infectives: Anti-infectives (From admission, onward)    Start     Dose/Rate Route Frequency Ordered Stop   10/14/21 1430  sulfamethoxazole-trimethoprim (BACTRIM DS) 800-160 MG per tablet 1 tablet        1 tablet Oral Every 12 hours 10/14/21 1332 10/16/21 2152   10/04/21 2330  ceFAZolin (ANCEF) IVPB 1 g/50 mL premix        1 g 100 mL/hr over 30 Minutes Intravenous Every 8 hours 10/04/21 1737 10/05/21 1014   10/04/21 0922  ceFAZolin (ANCEF) IVPB 2g/100 mL premix        2 g 200 mL/hr over 30 Minutes Intravenous 30 min pre-op 10/04/21 7564 10/04/21 1530   10/04/21 0000  sulfamethoxazole-trimethoprim (BACTRIM DS) 800-160 MG tablet        1 tablet Oral 2 times daily 10/04/21 1048         Assessment/Plan: POD#13 S/P  Robotic assisted laparoscopic radical prostatectomy (bilateral nerve sparing) Dr. Alinda Money Bilateral robotic assisted laparoscopic pelvic lymphadenectomy Dr. Alinda Money Laparoscopic lysis of adhesions, oversew of small bowel serosal injuries x2 Dr Brantley Stage    Post-operative ileus vs. Early SBO NG tube out Scheduled Reglan Daily Dulcolax suppository  PRN Simethicone CT scan abdomen and pelvis today to rule out SBO   Mobilize   ID - ancef 7/10 FEN - back off to clear liquids VTE - SCDs, SQH Foley - per urology     LOS: 12 days    Maia Petties 10/17/2021

## 2021-10-18 LAB — GLUCOSE, CAPILLARY
Glucose-Capillary: 127 mg/dL — ABNORMAL HIGH (ref 70–99)
Glucose-Capillary: 115 mg/dL — ABNORMAL HIGH (ref 70–99)
Glucose-Capillary: 115 mg/dL — ABNORMAL HIGH (ref 70–99)

## 2021-10-18 LAB — CBC
HCT: 34 % — ABNORMAL LOW (ref 39.0–52.0)
Hemoglobin: 11.1 g/dL — ABNORMAL LOW (ref 13.0–17.0)
MCH: 29.4 pg (ref 26.0–34.0)
MCHC: 32.6 g/dL (ref 30.0–36.0)
MCV: 90.2 fL (ref 80.0–100.0)
Platelets: 458 10*3/uL — ABNORMAL HIGH (ref 150–400)
RBC: 3.77 MIL/uL — ABNORMAL LOW (ref 4.22–5.81)
RDW: 15 % (ref 11.5–15.5)
WBC: 31.7 10*3/uL — ABNORMAL HIGH (ref 4.0–10.5)
nRBC: 0.2 % (ref 0.0–0.2)

## 2021-10-18 LAB — PHOSPHORUS: Phosphorus: 5.7 mg/dL — ABNORMAL HIGH (ref 2.5–4.6)

## 2021-10-18 LAB — BASIC METABOLIC PANEL
Anion gap: 9 (ref 5–15)
BUN: 44 mg/dL — ABNORMAL HIGH (ref 8–23)
CO2: 20 mmol/L — ABNORMAL LOW (ref 22–32)
Calcium: 8.4 mg/dL — ABNORMAL LOW (ref 8.9–10.3)
Chloride: 104 mmol/L (ref 98–111)
Creatinine, Ser: 1.78 mg/dL — ABNORMAL HIGH (ref 0.61–1.24)
GFR, Estimated: 42 mL/min — ABNORMAL LOW (ref >60)
Glucose, Bld: 110 mg/dL — ABNORMAL HIGH (ref 70–99)
Potassium: 5.1 mmol/L (ref 3.5–5.1)
Sodium: 133 mmol/L — ABNORMAL LOW (ref 135–145)

## 2021-10-18 LAB — MAGNESIUM: Magnesium: 2.2 mg/dL (ref 1.7–2.4)

## 2021-10-18 MED ORDER — TRAVASOL 10 % IV SOLN
INTRAVENOUS | Status: AC
  Filled 2021-10-18: qty 499.2

## 2021-10-18 NOTE — Progress Notes (Signed)
Urology Inpatient Progress Report  Prostate cancer ([place]) [C61]  Procedure(s): XI ROBOTIC ASSISTED LAPAROSCOPIC RADICAL PROSTATECTOMY LEVEL 3 LYMPHADENECTOMY, PELVIC LAPAROSCOPIC LYSIS OF ADHESIONS  14 Days Post-Op   Intv/Subj: NG tube replaced yesterday, ~3L out since placement. Abdomen softer this AM, passing loose stools.  Principal Problem:   Prostate cancer ([place])  Current Facility-Administered Medications  Medication Dose Route Frequency Provider Last Rate Last Admin   0.9 %  sodium chloride infusion   Intravenous Continuous Marton Redwood III, MD 75 mL/hr at 10/18/21 0302 Infusion Verify at 10/18/21 0302   bisacodyl (DULCOLAX) suppository 10 mg  10 mg Rectal Daily Donnie Mesa, MD   10 mg at 10/17/21 1139   Chlorhexidine Gluconate Cloth 2 % PADS 6 each  6 each Topical Daily Raynelle Bring, MD   6 each at 10/17/21 1003   diphenhydrAMINE (BENADRYL) injection 12.5-25 mg  12.5-25 mg Intravenous Q6H PRN Raynelle Bring, MD       Or   diphenhydrAMINE (BENADRYL) 12.5 MG/5ML elixir 12.5-25 mg  12.5-25 mg Oral Q6H PRN Raynelle Bring, MD       diphenhydrAMINE (BENADRYL) injection 25 mg  25 mg Intravenous QHS PRN Norm Parcel, PA-C   25 mg at 10/15/21 2155   feeding supplement (ENSURE SURGERY) liquid 237 mL  237 mL Oral TID BM Barkley Boards R, PA-C   237 mL at 10/15/21 2000   heparin injection 5,000 Units  5,000 Units Subcutaneous Q8H [name], MD   5,000 Units at 10/18/21 0544   hyoscyamine (LEVSIN SL) SL tablet 0.125 mg  0.125 mg Sublingual Q6H PRN Raynelle Bring, MD       insulin aspart (novoLOG) injection 0-9 Units  0-9 Units Subcutaneous Q8H Emiliano Dyer, RPH   1 Units at 10/18/21 0543   metoCLOPramide (REGLAN) injection 10 mg  10 mg Intravenous Q6H Alexis Frock, MD   10 mg at 10/18/21 0544   morphine (PF) 2 MG/ML injection 2-4 mg  2-4 mg Intravenous Q3H PRN Norm Parcel, PA-C       neomycin-bacitracin-polymyxin [id number] OINT 1 Application  1 Application  Topical TID PRN Raynelle Bring, MD       ondansetron [facility]) injection 4 mg  4 mg Intravenous Q4H PRN Raynelle Bring, MD   4 mg at 10/16/21 1744   Oral care mouth rinse  15 mL Mouth Rinse PRN Raynelle Bring, MD       pantoprazole (PROTONIX) injection 40 mg  40 mg Intravenous Q24H Alexis Frock, MD   40 mg at 10/17/21 2233   phenol (CHLORASEPTIC) mouth spray 1 spray  1 spray Mouth/Throat PRN Meuth, Brooke A, PA-C   1 spray at 10/08/21 1605   prochlorperazine (COMPAZINE) injection 10 mg  10 mg Intravenous Q6H PRN Marton Redwood III, MD   10 mg at 10/17/21 0758   simethicone (MYLICON) 40 VZ/8.5YI suspension 40 mg  40 mg Oral Q6H PRN Donnie Mesa, MD       sodium chloride flush (NS) 0.9 % injection 10-40 mL  10-40 mL Intracatheter PRN Raynelle Bring, MD   10 mL at 10/16/21 0411     Objective: Vital: Vitals:   10/17/21 1234 10/17/21 1500 10/17/21 2029 10/18/21 0536  BP: 113/73  113/79 135/77  Pulse: (!) 120  (!) 109 (!) 109  Resp: '18  18 18  '$ Temp: 98.6 F (37 C)  98.5 F (36.9 C) 98.4 F (36.9 C)  TempSrc: Oral  Oral Oral  SpO2: 100%  99% 98%  Weight:  68.4 kg    Height:       I/Os: I/O last 3 completed shifts: In: 2498.1 [P.O.:480; I.V.:2018.1] Out: 3000 [Emesis/NG output:3000]  Physical Exam:  General: Patient is in no apparent distress Lungs: Normal respiratory effort, chest expands symmetrically. GI: The abdomen is soft, non distended. NG tube with dark green output Ext: lower extremities symmetric  Lab Results: Recent Labs    10/18/21 0320  WBC 31.7*  HGB 11.1*  HCT 34.0*   Recent Labs    10/17/21 2202 10/18/21 0320  NA 133* 133*  K 5.4* 5.1  CL 101 104  CO2 19* 20*  GLUCOSE 126* 110*  BUN 48* 44*  CREATININE 1.93* 1.78*  CALCIUM 8.3* 8.4*    No results for input(s): "LABPT", "INR" in the last 72 hours. No results for input(s): "LABURIN" in the last 72 hours. No results found for this or any previous visit.  Studies/Results: DG Abd 1  View  Result Date: 10/17/2021 CLINICAL DATA:  Evaluate NG tube placement. EXAM: ABDOMEN - 1 VIEW COMPARISON:  CT AP from earlier today. FINDINGS: Enteric tube tip and side port are well below the level of the GE junction within the expected location of the gastric fundus. Multiple dilated loops of small bowel are noted within the upper abdomen. Diffuse gaseous distension of the gastric cardia noted. IMPRESSION: 1. Enteric tube tip is in the gastric fundus. 2. Persistent small bowel obstruction pattern. Electronically Signed   By: Kerby Moors M.D.   On: 10/17/2021 14:50   CT ABDOMEN PELVIS W CONTRAST  Result Date: 10/17/2021 CLINICAL DATA:  Small-bowel obstruction EXAM: CT ABDOMEN AND PELVIS WITH CONTRAST TECHNIQUE: Multidetector CT imaging of the abdomen and pelvis was performed using the standard protocol following bolus administration of intravenous contrast. RADIATION DOSE REDUCTION: This exam was performed according to the departmental dose-optimization program which includes automated exposure control, adjustment of the mA and/or kV according to patient size and/or use of iterative reconstruction technique. CONTRAST:  118m OMNIPAQUE IOHEXOL 300 MG/ML  SOLN COMPARISON:  Previous studies including the CT done on 0July 24, 2023FINDINGS: Lower chest: Small patchy infiltrates are seen in the lower lobes, more so on the right side. Hepatobiliary: There is fatty infiltration of the liver. There is 2.6 cm cyst in the liver in image 20. There are other tiny low-density foci which are too small to optimally characterize. There is no dilation of bile ducts. There are small high density foci in the lumen of gallbladder suggesting gallstones. Pancreas: There is slight prominence of pancreatic duct. No focal abnormalities are seen. Spleen: Not enlarged. Adrenals/Urinary Tract: Adrenals are unremarkable. There is no hydronephrosis. There are no renal or ureteral stones. Urinary bladder is almost completely empty. Wall  thickening in the bladder may be due to incomplete distension. Stomach/Bowel: Stomach is distended with fluid in the lumen. There is abnormal marked dilation of proximal small bowel loops including the duodenum. Distal small bowel loops are decompressed. Zone of transition is noted in midline slightly above the level of umbilicus in image 53 of series 2. small bowel loops distal to this level are decompressed. The appendix is not distinctly seen. There is no significant wall thickening and colon. Colon is partially decompressed. Vascular/Lymphatic: Atherosclerotic plaques and calcifications are seen in aorta and its major branches. Reproductive: Prostate is not visualized suggesting possible previous intervention. Other: Small amount of pelvic ascites is seen. There is no pneumoperitoneum. Small umbilical hernia containing fat is seen. Small right inguinal hernia containing fat is seen.  There is subcutaneous stranding in the anterior abdominal wall, possibly related to previous intervention. Musculoskeletal: No acute findings are seen. IMPRESSION: High-grade partial small bowel obstruction with marked dilation of proximal small bowel loops. There is decompression of distal small bowel loops. Zone of transition is noted in midline slightly above the level of the umbilicus. This may be due to adhesions or stricture. There is no pneumoperitoneum. Small patchy infiltrates are seen in the posterior lower lung fields with interval improvement. There is no hydronephrosis. Other findings as described in the body of the report. Electronically Signed   By: Elmer Picker M.D.   On: 10/17/2021 12:10    Assessment: Moderate risk prostate cancer Ileus  Procedure(s): XI ROBOTIC ASSISTED LAPAROSCOPIC RADICAL PROSTATECTOMY LEVEL 3 LYMPHADENECTOMY, PELVIC LAPAROSCOPIC LYSIS OF ADHESIONS, 14 Days Post-Op  doing well.  Plan: - Discussed with general surgery, expect this is persistent ileus vs early bowel obstruction  with high chance of spontaneous resolution with time. Continue TPN, NG

## 2021-10-18 NOTE — Plan of Care (Signed)
  Problem: Bowel/Gastric: Goal: Gastrointestinal status for postoperative course will improve Outcome: Not Progressing   Problem: Clinical Measurements: Goal: Cardiovascular complication will be avoided Outcome: Adequate for Discharge

## 2021-10-18 NOTE — Progress Notes (Signed)
Nutrition Follow-up  INTERVENTION:   -TPN management per Pharmacy  -Will monitor for diet advancement  NUTRITION DIAGNOSIS:   Inadequate oral intake related to acute illness as evidenced by other (comment) (CLD and need for NGT to LIS).  Ongoing. Now NPO  GOAL:   Patient will meet greater than or equal to 90% of their needs  Progressing now TPN is being resumed.  MONITOR:   PO intake, Supplement acceptance, Diet advancement, Labs, Weight trends, Other (Comment) (TPN regimen)  REASON FOR ASSESSMENT:   Consult New TPN/TNA  ASSESSMENT:   66 year-old male with medical history. On 7/10 he underwent robotic-assisted lap radical prostatectomy with pelvic lymphadenectomy. Medical course complicated by ileus with need for NGT placement for decompression and TPN initiation.  7/10 admitted 7/13: NGT placed, set to suction 7/17: TPN initiated 7/22: TPN stopped 7/23: NGT replaced, set to suction  Patient now NPO. Pt did have a BM this morning.  TPN to resume today at 40 ml/hr, providing 977 kcals and 49g protein.   Last accepted an Ensure supplement on 7/21.   Admission weight: 164 lbs. Current weight: 150 lbs  Medications: Dulcolax, IV Reglan    Labs reviewed:  CBGs: 115-161 Low Na Elevated Phos  Diet Order:   Diet Order             Diet NPO time specified  Diet effective midnight                   EDUCATION NEEDS:   Education needs have been addressed  Skin:  Skin Assessment: Skin Integrity Issues: Skin Integrity Issues:: Incisions Incisions: upper umbilicus (1/51)  Last BM:  7/24  Height:   Ht Readings from Last 1 Encounters:  10/04/21 '5\' 11"'$  (1.803 m)    Weight:   Wt Readings from Last 1 Encounters:  10/17/21 68.4 kg    Ideal Body Weight:  78.2 kg  BMI:  Body mass index is 21.02 kg/m.  Estimated Nutritional Needs:   Kcal:  2100-2300 kcal  Protein:  105-120 grams  Fluid:  >/= 2.3 L/day   Clayton Bibles, MS, RD, LDN Inpatient  Clinical Dietitian Contact information available via Amion

## 2021-10-18 NOTE — Progress Notes (Signed)
14 Days Post-Op   Subjective/Chief Complaint: NGT replaced yesterday and large volume bilious drainage. Did have a small bowel movement this AM. Not passing much flatus. Pt hoping to walk some this AM. Pt and wife still hopeful to avoid surgery at this point. Discussed with urologist as well.   Objective: Vital signs in last 24 hours: Temp:  [98.4 F (36.9 C)-98.6 F (37 C)] 98.4 F (36.9 C) (07/24 0536) Pulse Rate:  [109-120] 109 (07/24 0536) Resp:  [18] 18 (07/24 0536) BP: (113-135)/(73-79) 135/77 (07/24 0536) SpO2:  [98 %-100 %] 98 % (07/24 0536) Weight:  [68.4 kg] 68.4 kg (07/23 1500) Last BM Date : 10/18/21  Intake/Output from previous day: 07/23 0701 - 07/24 0700 In: 1911.1 [P.O.:480; I.V.:1431.1] Out: 3000 [Emesis/NG output:3000] Intake/Output this shift: Total I/O In: 30 [NG/GT:30] Out: -   PE: Gen:  Alert, NAD, pleasant Pulm:  Normal effort Abd: abdomen distended but soft, non-tender, +BS  Skin: warm and dry, no rashes  Psych: A&Ox3   Lab Results:  Recent Labs    10/18/21 0320  WBC 31.7*  HGB 11.1*  HCT 34.0*  PLT 458*   BMET Recent Labs    10/17/21 2202 10/18/21 0320  NA 133* 133*  K 5.4* 5.1  CL 101 104  CO2 19* 20*  GLUCOSE 126* 110*  BUN 48* 44*  CREATININE 1.93* 1.78*  CALCIUM 8.3* 8.4*    PT/INR No results for input(s): "LABPROT", "INR" in the last 72 hours. ABG No results for input(s): "PHART", "HCO3" in the last 72 hours.  Invalid input(s): "PCO2", "PO2"  Studies/Results: DG Abd 1 View  Result Date: 10/17/2021 CLINICAL DATA:  Evaluate NG tube placement. EXAM: ABDOMEN - 1 VIEW COMPARISON:  CT AP from earlier today. FINDINGS: Enteric tube tip and side port are well below the level of the GE junction within the expected location of the gastric fundus. Multiple dilated loops of small bowel are noted within the upper abdomen. Diffuse gaseous distension of the gastric cardia noted. IMPRESSION: 1. Enteric tube tip is in the gastric fundus.  2. Persistent small bowel obstruction pattern. Electronically Signed   By: Kerby Moors M.D.   On: 10/17/2021 14:50   CT ABDOMEN PELVIS W CONTRAST  Result Date: 10/17/2021 CLINICAL DATA:  Small-bowel obstruction EXAM: CT ABDOMEN AND PELVIS WITH CONTRAST TECHNIQUE: Multidetector CT imaging of the abdomen and pelvis was performed using the standard protocol following bolus administration of intravenous contrast. RADIATION DOSE REDUCTION: This exam was performed according to the departmental dose-optimization program which includes automated exposure control, adjustment of the mA and/or kV according to patient size and/or use of iterative reconstruction technique. CONTRAST:  151m OMNIPAQUE IOHEXOL 300 MG/ML  SOLN COMPARISON:  Previous studies including the CT done on 0August 15, 2023FINDINGS: Lower chest: Small patchy infiltrates are seen in the lower lobes, more so on the right side. Hepatobiliary: There is fatty infiltration of the liver. There is 2.6 cm cyst in the liver in image 20. There are other tiny low-density foci which are too small to optimally characterize. There is no dilation of bile ducts. There are small high density foci in the lumen of gallbladder suggesting gallstones. Pancreas: There is slight prominence of pancreatic duct. No focal abnormalities are seen. Spleen: Not enlarged. Adrenals/Urinary Tract: Adrenals are unremarkable. There is no hydronephrosis. There are no renal or ureteral stones. Urinary bladder is almost completely empty. Wall thickening in the bladder may be due to incomplete distension. Stomach/Bowel: Stomach is distended with fluid in the lumen.  There is abnormal marked dilation of proximal small bowel loops including the duodenum. Distal small bowel loops are decompressed. Zone of transition is noted in midline slightly above the level of umbilicus in image 53 of series 2. small bowel loops distal to this level are decompressed. The appendix is not distinctly seen. There is no  significant wall thickening and colon. Colon is partially decompressed. Vascular/Lymphatic: Atherosclerotic plaques and calcifications are seen in aorta and its major branches. Reproductive: Prostate is not visualized suggesting possible previous intervention. Other: Small amount of pelvic ascites is seen. There is no pneumoperitoneum. Small umbilical hernia containing fat is seen. Small right inguinal hernia containing fat is seen. There is subcutaneous stranding in the anterior abdominal wall, possibly related to previous intervention. Musculoskeletal: No acute findings are seen. IMPRESSION: High-grade partial small bowel obstruction with marked dilation of proximal small bowel loops. There is decompression of distal small bowel loops. Zone of transition is noted in midline slightly above the level of the umbilicus. This may be due to adhesions or stricture. There is no pneumoperitoneum. Small patchy infiltrates are seen in the posterior lower lung fields with interval improvement. There is no hydronephrosis. Other findings as described in the body of the report. Electronically Signed   By: Elmer Picker M.D.   On: 10/17/2021 12:10    Anti-infectives: Anti-infectives (From admission, onward)    Start     Dose/Rate Route Frequency Ordered Stop   10/14/21 1430  sulfamethoxazole-trimethoprim (BACTRIM DS) 800-160 MG per tablet 1 tablet        1 tablet Oral Every 12 hours 10/14/21 1332 10/16/21 2152   10/04/21 2330  ceFAZolin (ANCEF) IVPB 1 g/50 mL premix        1 g 100 mL/hr over 30 Minutes Intravenous Every 8 hours 10/04/21 1737 10/05/21 1014   10/04/21 0922  ceFAZolin (ANCEF) IVPB 2g/100 mL premix        2 g 200 mL/hr over 30 Minutes Intravenous 30 min pre-op 10/04/21 6712 10/04/21 1530   10/04/21 0000  sulfamethoxazole-trimethoprim (BACTRIM DS) 800-160 MG tablet        1 tablet Oral 2 times daily 10/04/21 1048         Assessment/Plan: POD#14 S/P Robotic assisted laparoscopic radical  prostatectomy (bilateral nerve sparing) Dr. Alinda Money Bilateral robotic assisted laparoscopic pelvic lymphadenectomy Dr. Alinda Money Laparoscopic lysis of adhesions, oversew of small bowel serosal injuries x2 Dr Brantley Stage    Post-operative ileus vs. Early SBO NG back in CT yesterday with SBO with transition slightly above umbilicus Scheduled Reglan Daily Dulcolax suppository Reordered TPN Ok to clamp NGT for mobilization but otherwise would keep to LIWS today  Mobilize   ID - ancef 7/10 FEN - NPO, TPN, NGT to LIWS VTE - SCDs, SQH Foley - removed last week by uro   LOS: 13 days    Norm Parcel 10/18/2021

## 2021-10-18 NOTE — Progress Notes (Signed)
PHARMACY - TOTAL PARENTERAL NUTRITION CONSULT NOTE   Indication: bowel obstruction  Patient Measurements: Height: _0  (180.3 cm) Weight: 68.4 kg (150 lb 11.2 oz) IBW/kg (Calculated) : 75.3 TPN AdjBW (KG): 74.4 Body mass index is 21.02 kg/m.   Assessment:  66 yo male with hx HTN, HLD, prostate cancer s/p LUTS. S/p robotic assisted laparoscopic radical prostatectomy, bilateral robotic assisted laparoscopic pelvic lymphadenectomy, LOA, oversew of small bowel serosal injuries on 10/04/21. Patient on TPN 10/11/21-10/16/21. CT on 10/17/21 with SBO with transition slightly above umbilicus. Pharmacy consulted 10/18/21 to resume TPN.  Glucose / Insulin: No hx DM.  A1c 5. - CBGs at goal < 150 - 4 units SSI required in past 24 hours Electrolytes: Na slightly low at 133, K on upper end of goal range at 5.1. Phos elevated at 5.7. Mg 2.2. Corrected Calcium 10. Cl low at 20.  Renal: AKI on admit. SCr improved to WNL 7/21, now increased to 1.78.  Hepatic: AST/ALT elevated and rising, Alk Phos and Tbili WNL, Alb low (7/21) Trig: WNL (7/20) Intake / Output: NG replaced 7/23 with 3L emesis/NG output. 2x urine, 1x stool occurrence MIVF: NS at 89m/hr GI Imaging: 7/16: CT abd pelvis without abscess or fluid collection but concern for small bowel obstruction. 7/18: AXR: Findings are compatible with interval worsening of reported adynamic ileus 7/19: AXR 8hr delay: Findings compatible with un underlying partial small bowel obstruction. Oral contrast has reached the ascending colon >> 24hr delay shows persistent small bowel dilation measuring up to 4.8 cm.  Oral contrast throughout the colon. 7/23 CT abd pelvis with high-grade partial small bowel obstruction with marked dilation of proximal small bowel loops GI Surgeries / Procedures:  7/10: surgery as per HPI  Central access: PICC 7/17 TPN start date: 7/17-7/22, resume 7/24  Nutritional Goals:  Goal TPN rate is 90 mL/hr (provides 112 g of protein and  2199 kcals per day)  RD Assessment: 10/11/21 Estimated Needs Total Energy Estimated Needs: 2100-2300 kcal Total Protein Estimated Needs: 105-120 grams Total Fluid Estimated Needs: >/= 2.3 L/day  Current Nutrition:  NPO  TPN to resume 7/24  Plan:  Resume TPN at 40 mL/hr at 1800 Electrolytes in TPN:  Na 737m/L,  K 11m75mL, Ca 11mE17m,  Mg 1mEq51m Phos 11mmol55m Cl:Ac 1:2 Add standard MVI and trace elements to TPN Continue Sensitive q8h SSI and adjust as needed  MIVF per MD Monitor TPN labs on Mon/Thurs CMET, Mg, Phos in AM  Shigeru Lampert Lindell SparmD, BCPS Clinical Pharmacist  10/18/2021,11:10 AM

## 2021-10-18 NOTE — Plan of Care (Signed)
  Problem: Clinical Measurements: Goal: Ability to maintain clinical measurements within normal limits will improve Outcome: Progressing Goal: Will remain free from infection Outcome: Progressing Goal: Diagnostic test results will improve Outcome: Progressing Goal: Cardiovascular complication will be avoided Outcome: Progressing   Problem: Elimination: Goal: Will not experience complications related to bowel motility Outcome: Progressing Goal: Will not experience complications related to urinary retention Outcome: Progressing   Problem: Safety: Goal: Ability to remain free from injury will improve Outcome: Progressing   Problem: Skin Integrity: Goal: Risk for impaired skin integrity will decrease Outcome: Progressing   Problem: Education: Goal: Knowledge of the procedure and recovery process will improve Outcome: Progressing   Problem: Bowel/Gastric: Goal: Gastrointestinal status for postoperative course will improve Outcome: Progressing   Problem: Pain Management: Goal: General experience of comfort will improve Outcome: Progressing   Problem: Skin Integrity: Goal: Demonstration of wound healing without infection will improve Outcome: Progressing   Problem: Urinary Elimination: Goal: Ability to avoid or minimize complications of infection will improve Outcome: Progressing Goal: Ability to achieve and maintain urine output will improve Outcome: Progressing Goal: Home care management will improve Outcome: Progressing   Problem: Education: Goal: Ability to describe self-care measures that may prevent or decrease complications (Diabetes Survival Skills Education) will improve Outcome: Progressing Goal: Individualized Educational Video(s) Outcome: Progressing   Problem: Coping: Goal: Ability to adjust to condition or change in health will improve Outcome: Progressing   Problem: Fluid Volume: Goal: Ability to maintain a balanced intake and output will  improve Outcome: Progressing   Problem: Health Behavior/Discharge Planning: Goal: Ability to identify and utilize available resources and services will improve Outcome: Progressing Goal: Ability to manage health-related needs will improve Outcome: Progressing   Problem: Metabolic: Goal: Ability to maintain appropriate glucose levels will improve Outcome: Progressing   Problem: Nutritional: Goal: Maintenance of adequate nutrition will improve Outcome: Progressing Goal: Progress toward achieving an optimal weight will improve Outcome: Progressing   Problem: Skin Integrity: Goal: Risk for impaired skin integrity will decrease Outcome: Progressing   Problem: Tissue Perfusion: Goal: Adequacy of tissue perfusion will improve Outcome: Progressing

## 2021-10-19 LAB — COMPREHENSIVE METABOLIC PANEL
ALT: 233 U/L — ABNORMAL HIGH (ref 0–44)
AST: 50 U/L — ABNORMAL HIGH (ref 15–41)
Albumin: 2.5 g/dL — ABNORMAL LOW (ref 3.5–5.0)
Alkaline Phosphatase: 142 U/L — ABNORMAL HIGH (ref 38–126)
Anion gap: 9 (ref 5–15)
BUN: 39 mg/dL — ABNORMAL HIGH (ref 8–23)
CO2: 22 mmol/L (ref 22–32)
Calcium: 8.4 mg/dL — ABNORMAL LOW (ref 8.9–10.3)
Chloride: 108 mmol/L (ref 98–111)
Creatinine, Ser: 1.26 mg/dL — ABNORMAL HIGH (ref 0.61–1.24)
GFR, Estimated: >60 mL/min (ref >60)
Glucose, Bld: 127 mg/dL — ABNORMAL HIGH (ref 70–99)
Potassium: 4.2 mmol/L (ref 3.5–5.1)
Sodium: 139 mmol/L (ref 135–145)
Total Bilirubin: 0.5 mg/dL (ref 0.3–1.2)
Total Protein: 7.3 g/dL (ref 6.5–8.1)

## 2021-10-19 LAB — GLUCOSE, CAPILLARY
Glucose-Capillary: 138 mg/dL — ABNORMAL HIGH (ref 70–99)
Glucose-Capillary: 132 mg/dL — ABNORMAL HIGH (ref 70–99)
Glucose-Capillary: 145 mg/dL — ABNORMAL HIGH (ref 70–99)

## 2021-10-19 LAB — PHOSPHORUS: Phosphorus: 4 mg/dL (ref 2.5–4.6)

## 2021-10-19 LAB — MAGNESIUM: Magnesium: 2.4 mg/dL (ref 1.7–2.4)

## 2021-10-19 MED ORDER — SODIUM CHLORIDE 0.9 % IV SOLN: INTRAVENOUS | Status: AC

## 2021-10-19 MED ORDER — SODIUM CHLORIDE 0.9 % IV SOLN
25.0000 mg | Freq: Once | INTRAVENOUS | Status: AC
  Administered 2021-10-19: 25 mg via INTRAVENOUS
  Filled 2021-10-19: qty 1

## 2021-10-19 MED ORDER — TRAVASOL 10 % IV SOLN
INTRAVENOUS | Status: AC
  Filled 2021-10-19: qty 748.8

## 2021-10-19 MED ORDER — CEFOTETAN DISODIUM 2 G IJ SOLR
2.0000 g | Freq: Once | INTRAMUSCULAR | Status: AC
  Administered 2021-10-20: 2 g via INTRAVENOUS

## 2021-10-19 NOTE — Progress Notes (Signed)
15 Days Post-Op   Subjective/Chief Complaint: Still high OP from NGT overnight. Had a fatty appearing BM yesterday. Having more upper abdominal pain this AM that feels crampy in nature. Throat irritated by NGT and hiccoughs again. Dr. Dema Severin discussed with Dr. Tresa Moore this AM and long discussion with patient and wife this AM. Patient does not seem to be improving at this point despite progress last week. Options are to proceed to surgery tomorrow or if we don't do this then patient would likely need to go home on TPN and wait several weeks-months to see if he improves with conservative therapies. Patient and wife concerned about risk of blood loss related to surgery as patient is a Jehovah's witness and would not accept any whole blood products. They are also concerned about risk of further obstruction and development of further scar tissue in the future. They do understand that he is not truly getting better at this point however and seem willing to proceed with reoperation tomorrow at this time.   Objective: Vital signs in last 24 hours: Temp:  [97.7 F (36.5 C)-98.5 F (36.9 C)] 97.7 F (36.5 C) (07/25 0621) Pulse Rate:  [101-112] 101 (07/25 0621) Resp:  [17-20] 17 (07/25 1144) BP: (121-130)/(78-84) 126/78 (07/25 0621) SpO2:  [96 %-100 %] 96 % (07/25 0621) Last BM Date : 10/18/21  Intake/Output from previous day: 07/24 0701 - 07/25 0700 In: 925 [I.V.:895; NG/GT:30] Out: 850 [Emesis/NG output:850] Intake/Output this shift: No intake/output data recorded.  PE: Gen:  Alert, NAD, pleasant Pulm:  Normal effort Abd: abdomen distended but soft, mild ttp in epigastric abdomen, BS hypoactive, NGT bilious  Skin: warm and dry, no rashes  Psych: A&Ox3   Lab Results:  Recent Labs    10/18/21 0320  WBC 31.7*  HGB 11.1*  HCT 34.0*  PLT 458*    BMET Recent Labs    10/18/21 0320 10/19/21 0307  NA 133* 139  K 5.1 4.2  CL 104 108  CO2 20* 22  GLUCOSE 110* 127*  BUN 44* 39*   CREATININE 1.78* 1.26*  CALCIUM 8.4* 8.4*    PT/INR No results for input(s): "LABPROT", "INR" in the last 72 hours. ABG No results for input(s): "PHART", "HCO3" in the last 72 hours.  Invalid input(s): "PCO2", "PO2"  Studies/Results: DG Abd 1 View  Result Date: 10/17/2021 CLINICAL DATA:  Evaluate NG tube placement. EXAM: ABDOMEN - 1 VIEW COMPARISON:  CT AP from earlier today. FINDINGS: Enteric tube tip and side port are well below the level of the GE junction within the expected location of the gastric fundus. Multiple dilated loops of small bowel are noted within the upper abdomen. Diffuse gaseous distension of the gastric cardia noted. IMPRESSION: 1. Enteric tube tip is in the gastric fundus. 2. Persistent small bowel obstruction pattern. Electronically Signed   By: Kerby Moors M.D.   On: 10/17/2021 14:50    Anti-infectives: Anti-infectives (From admission, onward)    Start     Dose/Rate Route Frequency Ordered Stop   10/14/21 1430  sulfamethoxazole-trimethoprim (BACTRIM DS) 800-160 MG per tablet 1 tablet        1 tablet Oral Every 12 hours 10/14/21 1332 10/16/21 2152   10/04/21 2330  ceFAZolin (ANCEF) IVPB 1 g/50 mL premix        1 g 100 mL/hr over 30 Minutes Intravenous Every 8 hours 10/04/21 1737 10/05/21 1014   10/04/21 0922  ceFAZolin (ANCEF) IVPB 2g/100 mL premix        2 g  200 mL/hr over 30 Minutes Intravenous 30 min pre-op 10/04/21 8889 10/04/21 1530   10/04/21 0000  sulfamethoxazole-trimethoprim (BACTRIM DS) 800-160 MG tablet        1 tablet Oral 2 times daily 10/04/21 1048         Assessment/Plan: POD#15 S/P Robotic assisted laparoscopic radical prostatectomy (bilateral nerve sparing) Dr. Alinda Money Bilateral robotic assisted laparoscopic pelvic lymphadenectomy Dr. Alinda Money Laparoscopic lysis of adhesions, oversew of small bowel serosal injuries x2 Dr Brantley Stage    Post-operative ileus vs. Early SBO NG back in CT yesterday with SBO with transition slightly above  umbilicus Scheduled Reglan Daily Dulcolax suppository Reordered TPN At this point patient does not seem to be improving with conservative management and we are reaching the point where either the patient needs reoperation or several weeks to months of TPN and potentially decompression. Pt and wife would prefer to attempt reoperation at this time. We will plan to post for the OR tomorrow.    ID - ancef 7/10 FEN - NPO, TPN, NGT to LIWS VTE - SCDs, SQH Foley - removed last week by uro   LOS: 14 days    Norm Parcel 10/19/2021

## 2021-10-19 NOTE — Progress Notes (Signed)
Urology Inpatient Progress Report  Prostate cancer ([place]) [C61]  Procedure(s): XI ROBOTIC ASSISTED LAPAROSCOPIC RADICAL PROSTATECTOMY LEVEL 3 LYMPHADENECTOMY, PELVIC LAPAROSCOPIC LYSIS OF ADHESIONS  15 Days Post-Op   Intv/Subj: Vital signs stable overnight, NG tube with 850cc recorded, additional ~500cc in cannister. Abdomen soft and non-distended but patient uncomfortable with NG tube. Some frustration this morning regarding prolonged hospital course and plan moving forward. Continues to ambulate frequently  Principal Problem:   Prostate cancer ([place])  Current Facility-Administered Medications  Medication Dose Route Frequency Provider Last Rate Last Admin   0.9 %  sodium chloride infusion   Intravenous Continuous Marton Redwood III, MD 75 mL/hr at 10/19/21 0103 New Bag at 10/19/21 0103   bisacodyl (DULCOLAX) suppository 10 mg  10 mg Rectal Daily Donnie Mesa, MD   10 mg at 10/18/21 1358   Chlorhexidine Gluconate Cloth 2 % PADS 6 each  6 each Topical Daily Raynelle Bring, MD   6 each at 10/18/21 1120   diphenhydrAMINE (BENADRYL) injection 12.5-25 mg  12.5-25 mg Intravenous Q6H PRN Raynelle Bring, MD       Or   diphenhydrAMINE (BENADRYL) 12.5 MG/5ML elixir 12.5-25 mg  12.5-25 mg Oral Q6H PRN Raynelle Bring, MD       diphenhydrAMINE (BENADRYL) injection 25 mg  25 mg Intravenous QHS PRN Norm Parcel, PA-C   25 mg at 10/18/21 2202   feeding supplement (ENSURE SURGERY) liquid 237 mL  237 mL Oral TID BM Norm Parcel, PA-C   237 mL at 10/15/21 2000   heparin injection 5,000 Units  5,000 Units Subcutaneous Q8H [name], MD   5,000 Units at 10/19/21 0604   hyoscyamine (LEVSIN SL) SL tablet 0.125 mg  0.125 mg Sublingual Q6H PRN Raynelle Bring, MD       insulin aspart (novoLOG) injection 0-9 Units  0-9 Units Subcutaneous Q8H Emiliano Dyer, [facility]   1 Units at 10/19/21 0618   metoCLOPramide (REGLAN) injection 10 mg  10 mg Intravenous Q6H Alexis Frock, MD   10 mg at 10/19/21  0600   morphine (PF) 2 MG/ML injection 2-4 mg  2-4 mg Intravenous Q3H PRN Norm Parcel, PA-C       neomycin-bacitracin-polymyxin [phone] OINT 1 Application  1 Application Topical TID PRN Raynelle Bring, MD       ondansetron [facility]) injection 4 mg  4 mg Intravenous Q4H PRN Raynelle Bring, MD   4 mg at 10/16/21 1744   Oral care mouth rinse  15 mL Mouth Rinse PRN Raynelle Bring, MD       pantoprazole (PROTONIX) injection 40 mg  40 mg Intravenous Q24H Alexis Frock, MD   40 mg at 10/18/21 2202   phenol (CHLORASEPTIC) mouth spray 1 spray  1 spray Mouth/Throat PRN Meuth, Brooke A, PA-C   1 spray at 10/08/21 1605   prochlorperazine (COMPAZINE) injection 10 mg  10 mg Intravenous Q6H PRN Marton Redwood III, MD   10 mg at 10/17/21 0758   simethicone (MYLICON) 40 QI/3.4VQ suspension 40 mg  40 mg Oral Q6H PRN Donnie Mesa, MD       sodium chloride flush (NS) 0.9 % injection 10-40 mL  10-40 mL Intracatheter PRN Raynelle Bring, MD   10 mL at 10/19/21 0309   TPN ADULT (ION)   Intravenous Continuous TPN Luiz Ochoa, RPH 40 mL/hr at 10/18/21 1801 New Bag at 10/18/21 1801     Objective: Vital: Vitals:   10/18/21 0536 10/18/21 1438 10/18/21 2003 10/19/21 0621  BP:  135/77 130/84 121/84 126/78  Pulse: (!) 109 (!) 105 (!) 112 (!) 101  Resp: '18 18 20 18  '$ Temp: 98.4 F (36.9 C) 98.5 F (36.9 C) 98.4 F (36.9 C) 97.7 F (36.5 C)  TempSrc: Oral Oral Oral Oral  SpO2: 98% 99% 100% 96%  Weight:      Height:       I/Os: I/O last 3 completed shifts: In: 1741.6 [I.V.:1711.6; NG/GT:30] Out: 1850 [Emesis/NG output:1850]  Physical Exam:  General: Patient is in no apparent distress Lungs: Normal respiratory effort, chest expands symmetrically. GI: The abdomen is soft, non distended. NG tube with thin brown/green output Ext: lower extremities symmetric  Lab Results: Recent Labs    10/18/21 0320  WBC 31.7*  HGB 11.1*  HCT 34.0*    Recent Labs    10/17/21 2202 10/18/21 0320  10/19/21 0307  NA 133* 133* 139  K 5.4* 5.1 4.2  CL 101 104 108  CO2 19* 20* 22  GLUCOSE 126* 110* 127*  BUN 48* 44* 39*  CREATININE 1.93* 1.78* 1.26*  CALCIUM 8.3* 8.4* 8.4*    No results for input(s): "LABPT", "INR" in the last 72 hours. No results for input(s): "LABURIN" in the last 72 hours. No results found for this or any previous visit.  Studies/Results: DG Abd 1 View  Result Date: 10/17/2021 CLINICAL DATA:  Evaluate NG tube placement. EXAM: ABDOMEN - 1 VIEW COMPARISON:  CT AP from earlier today. FINDINGS: Enteric tube tip and side port are well below the level of the GE junction within the expected location of the gastric fundus. Multiple dilated loops of small bowel are noted within the upper abdomen. Diffuse gaseous distension of the gastric cardia noted. IMPRESSION: 1. Enteric tube tip is in the gastric fundus. 2. Persistent small bowel obstruction pattern. Electronically Signed   By: Kerby Moors M.D.   On: 10/17/2021 14:50   CT ABDOMEN PELVIS W CONTRAST  Result Date: 10/17/2021 CLINICAL DATA:  Small-bowel obstruction EXAM: CT ABDOMEN AND PELVIS WITH CONTRAST TECHNIQUE: Multidetector CT imaging of the abdomen and pelvis was performed using the standard protocol following bolus administration of intravenous contrast. RADIATION DOSE REDUCTION: This exam was performed according to the departmental dose-optimization program which includes automated exposure control, adjustment of the mA and/or kV according to patient size and/or use of iterative reconstruction technique. CONTRAST:  132m OMNIPAQUE IOHEXOL 300 MG/ML  SOLN COMPARISON:  Previous studies including the CT done on 004-Aug-2023FINDINGS: Lower chest: Small patchy infiltrates are seen in the lower lobes, more so on the right side. Hepatobiliary: There is fatty infiltration of the liver. There is 2.6 cm cyst in the liver in image 20. There are other tiny low-density foci which are too small to optimally characterize. There is  no dilation of bile ducts. There are small high density foci in the lumen of gallbladder suggesting gallstones. Pancreas: There is slight prominence of pancreatic duct. No focal abnormalities are seen. Spleen: Not enlarged. Adrenals/Urinary Tract: Adrenals are unremarkable. There is no hydronephrosis. There are no renal or ureteral stones. Urinary bladder is almost completely empty. Wall thickening in the bladder may be due to incomplete distension. Stomach/Bowel: Stomach is distended with fluid in the lumen. There is abnormal marked dilation of proximal small bowel loops including the duodenum. Distal small bowel loops are decompressed. Zone of transition is noted in midline slightly above the level of umbilicus in image 53 of series 2. small bowel loops distal to this level are decompressed. The appendix is not  distinctly seen. There is no significant wall thickening and colon. Colon is partially decompressed. Vascular/Lymphatic: Atherosclerotic plaques and calcifications are seen in aorta and its major branches. Reproductive: Prostate is not visualized suggesting possible previous intervention. Other: Small amount of pelvic ascites is seen. There is no pneumoperitoneum. Small umbilical hernia containing fat is seen. Small right inguinal hernia containing fat is seen. There is subcutaneous stranding in the anterior abdominal wall, possibly related to previous intervention. Musculoskeletal: No acute findings are seen. IMPRESSION: High-grade partial small bowel obstruction with marked dilation of proximal small bowel loops. There is decompression of distal small bowel loops. Zone of transition is noted in midline slightly above the level of the umbilicus. This may be due to adhesions or stricture. There is no pneumoperitoneum. Small patchy infiltrates are seen in the posterior lower lung fields with interval improvement. There is no hydronephrosis. Other findings as described in the body of the report.  Electronically Signed   By: Elmer Picker M.D.   On: 10/17/2021 12:10    Assessment: Moderate risk prostate cancer Ileus  Procedure(s): XI ROBOTIC ASSISTED LAPAROSCOPIC RADICAL PROSTATECTOMY LEVEL 3 LYMPHADENECTOMY, PELVIC LAPAROSCOPIC LYSIS OF ADHESIONS, 15 Days Post-Op  doing well.  Plan: - Will re-discuss with general surgery today regarding continued NG and conservative management vs surgical management - Continue TPN, patient and wife with many questions this AM regarding the safety of TPN over multiple weeks, provided reassurance and all questions answered - Continue spontaneous voiding

## 2021-10-19 NOTE — Progress Notes (Signed)
PHARMACY - TOTAL PARENTERAL NUTRITION CONSULT NOTE   Indication: bowel obstruction  Patient Measurements: Height: 5' 11"  (180.3 cm) Weight: 68.4 kg (150 lb 11.2 oz) IBW/kg (Calculated) : 75.3 TPN AdjBW (KG): 74.4 Body mass index is 21.02 kg/m.   Assessment:  66 yo male with hx HTN, HLD, prostate cancer s/p LUTS. S/p robotic assisted laparoscopic radical prostatectomy, bilateral robotic assisted laparoscopic pelvic lymphadenectomy, LOA, oversew of small bowel serosal injuries on 10/04/21. Patient on TPN 10/11/21-10/16/21. CT on 10/17/21 with SBO with transition slightly above umbilicus. Pharmacy consulted 10/18/21 to resume TPN.  Glucose / Insulin: No hx DM.  A1c 5. - CBGs at goal < 150 - 1 unit SSI required in past 24 hours Electrolytes: Na improved to WNL. K, Phos, Corrected Ca improved (withheld in TPN bag 7/24). Mg on upper end of goal range.   Renal: AKI, SCr now improved to 1.26. BUN 39.   Hepatic: AST elevated, but improved. ALT elevated and rising. Alk Phos elevated and rising. Tbili WNL. Albumin low at 2.5.  Trig: WNL (last on 7/20) Intake / Output: NG replaced 7/23. 87m emesis/NG output. 3x urine occurrence, 1x stool occurrence.  MIVF: NS at 770mhr GI Imaging: 7/16: CT abd pelvis without abscess or fluid collection but concern for small bowel obstruction. 7/18: AXR: Findings are compatible with interval worsening of reported adynamic ileus 7/19: AXR 8hr delay: Findings compatible with un underlying partial small bowel obstruction. Oral contrast has reached the ascending colon >> 24hr delay shows persistent small bowel dilation measuring up to 4.8 cm.  Oral contrast throughout the colon. 7/23 CT abd pelvis with high-grade partial small bowel obstruction with marked dilation of proximal small bowel loops.  GI Surgeries / Procedures:  7/10: surgery as per HPI  Central access: PICC 7/17 TPN start date: 7/17-7/22, resume 7/24  Nutritional Goals:  Goal TPN rate is 90 mL/hr  (provides 112 g of protein and 2199 kcals per day)  RD Assessment:  Estimated Needs Total Energy Estimated Needs: 2100-2300 kcal Total Protein Estimated Needs: 105-120 grams Total Fluid Estimated Needs: >/= 2.3 L/day  Current Nutrition:  NPO  TPN  Plan:  At 1800: Advance TPN to 60 mL/hr Electrolytes in TPN:  Na 754mL,  K 52m14m, Ca 3mEq29m  Mg 0mEq/64mPhos 5mmol/73mCl:Ac 1:2 Add standard MVI and trace elements to TPN Continue Sensitive q8h SSI and adjust as needed  Decrease MIVF of NS to 50 mL/hr per d/w CCS Monitor TPN labs on Mon/Thurs CMET, Mg, Phos in AM   Ivan Lopez GLindell SparD, BCPS Clinical Pharmacist  10/19/2021,7:21 AM

## 2021-10-19 NOTE — Care Management Important Message (Signed)
Important Message  Patient Details IM Letter placed in Patients room. Name: Ivan Lopez MRN: [id number] Date of Birth: [birth year]   Medicare Important Message Given:  Yes     Kerin Salen 10/19/2021, 3:20 PM

## 2021-10-19 NOTE — Progress Notes (Signed)
Mobility Specialist - Progress Note     10/19/21 1100  Mobility  Activity Ambulated with assistance in hallway  Level of Assistance Minimal assist, patient does 75% or more  Assistive Device Front wheel walker  Distance Ambulated (ft) 840 ft  Activity Response Tolerated well  Transport method Ambulatory  $Mobility charge 1 Mobility   Pt was agreeable to be mobilized. Pt got to EOB with assistance of lines. Pt used RW to ambulate in hallway 870f. Pt upon returning to room was left on the chair with all necessities within reach and family in the room.   BFerd HibbsMobility Specialist

## 2021-10-20 ENCOUNTER — Other Ambulatory Visit: Payer: Self-pay

## 2021-10-20 ENCOUNTER — Encounter (HOSPITAL_COMMUNITY): Payer: Self-pay | Admitting: Urology

## 2021-10-20 ENCOUNTER — Inpatient Hospital Stay (HOSPITAL_COMMUNITY): Payer: Medicare Other | Admitting: Anesthesiology

## 2021-10-20 ENCOUNTER — Encounter (HOSPITAL_COMMUNITY): Admission: RE | Disposition: A | Discharge status: Home / Self Care | Payer: Self-pay | Source: Home / Self Care

## 2021-10-20 DIAGNOSIS — K565 Intestinal adhesions [bands], unspecified as to partial versus complete obstruction: Secondary | ICD-10-CM

## 2021-10-20 HISTORY — PX: LAPAROTOMY: SHX154

## 2021-10-20 LAB — COMPREHENSIVE METABOLIC PANEL
ALT: 153 U/L — ABNORMAL HIGH (ref 0–44)
AST: 27 U/L (ref 15–41)
Albumin: 2.1 g/dL — ABNORMAL LOW (ref 3.5–5.0)
Alkaline Phosphatase: 114 U/L (ref 38–126)
Anion gap: 6 (ref 5–15)
BUN: 28 mg/dL — ABNORMAL HIGH (ref 8–23)
CO2: 24 mmol/L (ref 22–32)
Calcium: 8.1 mg/dL — ABNORMAL LOW (ref 8.9–10.3)
Chloride: 111 mmol/L (ref 98–111)
Creatinine, Ser: 0.99 mg/dL (ref 0.61–1.24)
GFR, Estimated: >60 mL/min (ref >60)
Glucose, Bld: 135 mg/dL — ABNORMAL HIGH (ref 70–99)
Potassium: 3.7 mmol/L (ref 3.5–5.1)
Sodium: 141 mmol/L (ref 135–145)
Total Bilirubin: 0.4 mg/dL (ref 0.3–1.2)
Total Protein: 6 g/dL — ABNORMAL LOW (ref 6.5–8.1)

## 2021-10-20 LAB — MAGNESIUM: Magnesium: 2.3 mg/dL (ref 1.7–2.4)

## 2021-10-20 LAB — PHOSPHORUS: Phosphorus: 3.2 mg/dL (ref 2.5–4.6)

## 2021-10-20 LAB — GLUCOSE, CAPILLARY
Glucose-Capillary: 119 mg/dL — ABNORMAL HIGH (ref 70–99)
Glucose-Capillary: 168 mg/dL — ABNORMAL HIGH (ref 70–99)
Glucose-Capillary: 166 mg/dL — ABNORMAL HIGH (ref 70–99)

## 2021-10-20 SURGERY — LAPAROTOMY, EXPLORATORY
Anesthesia: General

## 2021-10-20 MED ORDER — FENTANYL CITRATE (PF) 100 MCG/2ML IJ SOLN
INTRAMUSCULAR | Status: AC
  Filled 2021-10-20: qty 2

## 2021-10-20 MED ORDER — SUCCINYLCHOLINE CHLORIDE 200 MG/10ML IV SOSY
PREFILLED_SYRINGE | INTRAVENOUS | Status: DC | PRN
  Administered 2021-10-20: 140 mg via INTRAVENOUS

## 2021-10-20 MED ORDER — SUCCINYLCHOLINE CHLORIDE 200 MG/10ML IV SOSY
PREFILLED_SYRINGE | INTRAVENOUS | Status: AC
  Filled 2021-10-20: qty 10

## 2021-10-20 MED ORDER — FENTANYL CITRATE (PF) 250 MCG/5ML IJ SOLN
INTRAMUSCULAR | Status: AC
  Filled 2021-10-20: qty 5

## 2021-10-20 MED ORDER — DEXAMETHASONE SODIUM PHOSPHATE 10 MG/ML IJ SOLN
INTRAMUSCULAR | Status: DC | PRN
  Administered 2021-10-20: 10 mg via INTRAVENOUS

## 2021-10-20 MED ORDER — MEPERIDINE HCL 50 MG/ML IJ SOLN: 6.2500 mg | INTRAMUSCULAR | Status: DC | PRN

## 2021-10-20 MED ORDER — OXYCODONE HCL 5 MG/5ML PO SOLN: 5.0000 mg | Freq: Once | ORAL | Status: DC | PRN

## 2021-10-20 MED ORDER — MIDAZOLAM HCL 5 MG/5ML IJ SOLN
INTRAMUSCULAR | Status: DC | PRN
  Administered 2021-10-20: 2 mg via INTRAVENOUS

## 2021-10-20 MED ORDER — METHOCARBAMOL 1000 MG/10ML IJ SOLN
1000.0000 mg | Freq: Three times a day (TID) | INTRAVENOUS | Status: DC
  Administered 2021-10-20 – 2021-11-03 (×40): 1000 mg via INTRAVENOUS
  Filled 2021-10-20 (×9): qty 1000
  Filled 2021-10-20: qty 10
  Filled 2021-10-20 (×5): qty 1000
  Filled 2021-10-20: qty 10
  Filled 2021-10-20 (×2): qty 1000
  Filled 2021-10-20 (×2): qty 10
  Filled 2021-10-20 (×6): qty 1000
  Filled 2021-10-20: qty 10
  Filled 2021-10-20 (×7): qty 1000
  Filled 2021-10-20 (×2): qty 10
  Filled 2021-10-20 (×3): qty 1000
  Filled 2021-10-20 (×2): qty 10
  Filled 2021-10-20 (×4): qty 1000
  Filled 2021-10-20 (×2): qty 10
  Filled 2021-10-20: qty 1000

## 2021-10-20 MED ORDER — DEXTROSE 10 % IV SOLN: INTRAVENOUS | Status: AC

## 2021-10-20 MED ORDER — LIDOCAINE HCL (PF) 2 % IJ SOLN
INTRAMUSCULAR | Status: DC | PRN
  Administered 2021-10-20: 1.5 mg/kg/h via INTRADERMAL

## 2021-10-20 MED ORDER — ROCURONIUM BROMIDE 100 MG/10ML IV SOLN
INTRAVENOUS | Status: DC | PRN
  Administered 2021-10-20 (×2): 20 mg via INTRAVENOUS
  Administered 2021-10-20: 60 mg via INTRAVENOUS

## 2021-10-20 MED ORDER — 0.9 % SODIUM CHLORIDE (POUR BTL) OPTIME
TOPICAL | Status: DC | PRN
  Administered 2021-10-20: 2000 mL

## 2021-10-20 MED ORDER — NALOXONE HCL 0.4 MG/ML IJ SOLN: 0.4000 mg | INTRAMUSCULAR | Status: DC | PRN

## 2021-10-20 MED ORDER — OXYCODONE HCL 5 MG PO TABS: 5.0000 mg | ORAL_TABLET | Freq: Once | ORAL | Status: DC | PRN

## 2021-10-20 MED ORDER — HYDROMORPHONE HCL 1 MG/ML IJ SOLN
INTRAMUSCULAR | Status: AC
  Administered 2021-10-20: 0.5 mg via INTRAVENOUS
  Filled 2021-10-20: qty 2

## 2021-10-20 MED ORDER — HYDROMORPHONE 1 MG/ML IV SOLN
INTRAVENOUS | Status: DC
  Administered 2021-10-20: 1.7 mg via INTRAVENOUS
  Administered 2021-10-20: 30 mg via INTRAVENOUS
  Administered 2021-10-21: 0.8 mg via INTRAVENOUS
  Administered 2021-10-21: 2.6 mg via INTRAVENOUS
  Administered 2021-10-21: 0.2 mg via INTRAVENOUS
  Administered 2021-10-21: 2.8 mg via INTRAVENOUS
  Administered 2021-10-22 (×2): 0.2 mg via INTRAVENOUS
  Administered 2021-10-22: 0.4 mg via INTRAVENOUS
  Administered 2021-10-23: 0.2 mg via INTRAVENOUS
  Administered 2021-10-23 (×2): 0.4 mg via INTRAVENOUS
  Administered 2021-10-23 (×2): 4 mg via INTRAVENOUS
  Administered 2021-10-23: 1.4 mg via INTRAVENOUS
  Administered 2021-10-24: 30 mg via INTRAVENOUS
  Administered 2021-10-24 (×2): 0.8 mg via INTRAVENOUS
  Administered 2021-10-24 – 2021-10-25 (×2): 0.2 mg via INTRAVENOUS
  Administered 2021-10-25 (×4): 0.4 mg via INTRAVENOUS
  Administered 2021-10-26: 0.2 mg via INTRAVENOUS
  Administered 2021-10-26 (×2): 0.4 mg via INTRAVENOUS
  Administered 2021-10-26: 0.6 mg via INTRAVENOUS
  Administered 2021-10-27: 0.2 mg via INTRAVENOUS
  Administered 2021-10-27 (×4): 0.4 mg via INTRAVENOUS
  Administered 2021-10-27: 1.2 mg via INTRAVENOUS
  Administered 2021-10-28: 0.8 mg via INTRAVENOUS
  Administered 2021-10-28: 0.4 mg via INTRAVENOUS
  Administered 2021-10-28: 0.6 mg via INTRAVENOUS
  Administered 2021-10-28: 0.8 mg via INTRAVENOUS
  Administered 2021-10-28: 0.4 mg via INTRAVENOUS
  Administered 2021-10-28 – 2021-10-29 (×2): 0.6 mg via INTRAVENOUS
  Administered 2021-10-29 (×2): 0.8 mg via INTRAVENOUS
  Filled 2021-10-20 (×2): qty 30

## 2021-10-20 MED ORDER — SODIUM CHLORIDE (PF) 0.9 % IJ SOLN
INTRAMUSCULAR | Status: AC
  Filled 2021-10-20: qty 10

## 2021-10-20 MED ORDER — FENTANYL CITRATE (PF) 100 MCG/2ML IJ SOLN
INTRAMUSCULAR | Status: DC | PRN
  Administered 2021-10-20: 100 ug via INTRAVENOUS
  Administered 2021-10-20: 50 ug via INTRAVENOUS
  Administered 2021-10-20 (×2): 100 ug via INTRAVENOUS

## 2021-10-20 MED ORDER — AMISULPRIDE (ANTIEMETIC) 5 MG/2ML IV SOLN: 10.0000 mg | Freq: Once | INTRAVENOUS | Status: DC | PRN

## 2021-10-20 MED ORDER — CHLORHEXIDINE GLUCONATE 0.12 % MT SOLN
15.0000 mL | Freq: Once | OROMUCOSAL | Status: AC
  Administered 2021-10-20: 15 mL via OROMUCOSAL

## 2021-10-20 MED ORDER — DEXAMETHASONE SODIUM PHOSPHATE 10 MG/ML IJ SOLN
INTRAMUSCULAR | Status: AC
  Filled 2021-10-20: qty 1

## 2021-10-20 MED ORDER — PROMETHAZINE HCL 25 MG/ML IJ SOLN: 6.2500 mg | INTRAMUSCULAR | Status: DC | PRN

## 2021-10-20 MED ORDER — DEXTROSE-NACL 10-0.45 % IV SOLN
INTRAVENOUS | Status: DC
  Filled 2021-10-20: qty 1000

## 2021-10-20 MED ORDER — DIPHENHYDRAMINE HCL 50 MG/ML IJ SOLN: 12.5000 mg | Freq: Four times a day (QID) | INTRAMUSCULAR | Status: DC | PRN

## 2021-10-20 MED ORDER — SODIUM CHLORIDE 0.9 % IV SOLN
INTRAVENOUS | Status: AC
  Filled 2021-10-20: qty 2

## 2021-10-20 MED ORDER — TRAVASOL 10 % IV SOLN
INTRAVENOUS | Status: AC
  Filled 2021-10-20: qty 748.8

## 2021-10-20 MED ORDER — ONDANSETRON HCL 4 MG/2ML IJ SOLN: 4.0000 mg | Freq: Four times a day (QID) | INTRAMUSCULAR | Status: DC | PRN

## 2021-10-20 MED ORDER — SODIUM CHLORIDE 0.9% FLUSH: 9.0000 mL | INTRAVENOUS | Status: DC | PRN

## 2021-10-20 MED ORDER — ACETAMINOPHEN 10 MG/ML IV SOLN
1000.0000 mg | Freq: Four times a day (QID) | INTRAVENOUS | Status: DC
Start: 2021-10-20 — End: 2021-10-21
  Administered 2021-10-20 – 2021-10-21 (×3): 1000 mg via INTRAVENOUS
  Filled 2021-10-20 (×3): qty 100

## 2021-10-20 MED ORDER — MIDAZOLAM HCL 2 MG/2ML IJ SOLN
INTRAMUSCULAR | Status: AC
  Filled 2021-10-20: qty 2

## 2021-10-20 MED ORDER — PROPOFOL 10 MG/ML IV BOLUS
INTRAVENOUS | Status: AC
  Filled 2021-10-20: qty 20

## 2021-10-20 MED ORDER — LIDOCAINE HCL (CARDIAC) PF 100 MG/5ML IV SOSY
PREFILLED_SYRINGE | INTRAVENOUS | Status: DC | PRN
  Administered 2021-10-20: 50 mg via INTRAVENOUS

## 2021-10-20 MED ORDER — ORAL CARE MOUTH RINSE: 15.0000 mL | Freq: Once | OROMUCOSAL | Status: AC

## 2021-10-20 MED ORDER — HYDROMORPHONE HCL 1 MG/ML IJ SOLN
0.2500 mg | INTRAMUSCULAR | Status: DC | PRN
  Administered 2021-10-20 (×2): 0.5 mg via INTRAVENOUS

## 2021-10-20 MED ORDER — HYDROMORPHONE HCL 1 MG/ML IJ SOLN
INTRAMUSCULAR | Status: DC | PRN
  Administered 2021-10-20 (×2): 1 mg via INTRAVENOUS

## 2021-10-20 MED ORDER — LIDOCAINE HCL (PF) 2 % IJ SOLN
INTRAMUSCULAR | Status: AC
  Filled 2021-10-20: qty 15

## 2021-10-20 MED ORDER — ONDANSETRON HCL 4 MG/2ML IJ SOLN
INTRAMUSCULAR | Status: DC | PRN
  Administered 2021-10-20: 4 mg via INTRAVENOUS

## 2021-10-20 MED ORDER — SUGAMMADEX SODIUM 500 MG/5ML IV SOLN
INTRAVENOUS | Status: DC | PRN
  Administered 2021-10-20: 200 mg via INTRAVENOUS

## 2021-10-20 MED ORDER — POTASSIUM CHLORIDE 10 MEQ/50ML IV SOLN
10.0000 meq | INTRAVENOUS | Status: AC
  Administered 2021-10-20 (×2): 10 meq via INTRAVENOUS
  Filled 2021-10-20 (×2): qty 50

## 2021-10-20 MED ORDER — MORPHINE SULFATE (PF) 2 MG/ML IV SOLN
2.0000 mg | INTRAVENOUS | Status: DC | PRN
  Administered 2021-10-20: 2 mg via INTRAVENOUS
  Filled 2021-10-20: qty 1

## 2021-10-20 MED ORDER — ONDANSETRON HCL 4 MG/2ML IJ SOLN
INTRAMUSCULAR | Status: AC
  Filled 2021-10-20: qty 2

## 2021-10-20 MED ORDER — HYDROMORPHONE HCL 2 MG/ML IJ SOLN
INTRAMUSCULAR | Status: AC
  Filled 2021-10-20: qty 1

## 2021-10-20 MED ORDER — LACTATED RINGERS IV SOLN: INTRAVENOUS | Status: DC

## 2021-10-20 MED ORDER — PROPOFOL 10 MG/ML IV BOLUS
INTRAVENOUS | Status: DC | PRN
  Administered 2021-10-20: 150 mg via INTRAVENOUS

## 2021-10-20 MED ORDER — ROCURONIUM BROMIDE 10 MG/ML (PF) SYRINGE
PREFILLED_SYRINGE | INTRAVENOUS | Status: AC
  Filled 2021-10-20: qty 10

## 2021-10-20 MED ORDER — DIPHENHYDRAMINE HCL 12.5 MG/5ML PO ELIX: 12.5000 mg | ORAL_SOLUTION | Freq: Four times a day (QID) | ORAL | Status: DC | PRN

## 2021-10-20 SURGICAL SUPPLY — 58 items
APL PRP STRL LF DISP 70% ISPRP (MISCELLANEOUS) ×1
BAG COUNTER SPONGE SURGICOUNT (BAG) IMPLANT
BAG SPNG CNTER NS LX DISP (BAG)
BLADE EXTENDED COATED 6.5IN (ELECTRODE) ×1 IMPLANT
BRR ADH 5X3 SEPRAFILM 6 SHT (MISCELLANEOUS) ×1
CELLS DAT CNTRL 66122 CELL SVR (MISCELLANEOUS) ×1 IMPLANT
CHLORAPREP W/TINT 26 (MISCELLANEOUS) ×2 IMPLANT
COVER MAYO STAND STRL (DRAPES) ×2 IMPLANT
DRAIN CHANNEL 19F RND (DRAIN) IMPLANT
DRAPE LAPAROSCOPIC ABDOMINAL (DRAPES) ×2 IMPLANT
DRAPE SHEET LG 3/4 BI-LAMINATE (DRAPES) IMPLANT
DRAPE UTILITY XL STRL (DRAPES) ×2 IMPLANT
DRAPE WARM FLUID 44X44 (DRAPES) ×2 IMPLANT
DRSG OPSITE POSTOP 4X10 (GAUZE/BANDAGES/DRESSINGS) ×1 IMPLANT
ELECT PENCIL ROCKER SW 15FT (MISCELLANEOUS) ×1 IMPLANT
ELECT REM PT RETURN 15FT ADLT (MISCELLANEOUS) ×2 IMPLANT
EVACUATOR SILICONE 100CC (DRAIN) IMPLANT
GAUZE SPONGE 4X4 12PLY STRL (GAUZE/BANDAGES/DRESSINGS) ×1 IMPLANT
GLOVE ECLIPSE 8.0 STRL XLNG CF (GLOVE) ×4 IMPLANT
GLOVE INDICATOR 8.0 STRL GRN (GLOVE) ×2 IMPLANT
GOWN STRL REUS W/ TWL XL LVL3 (GOWN DISPOSABLE) ×2 IMPLANT
GOWN STRL REUS W/TWL XL LVL3 (GOWN DISPOSABLE) ×4
HANDLE SUCTION POOLE (INSTRUMENTS) ×1 IMPLANT
KIT BASIN OR (CUSTOM PROCEDURE TRAY) ×2 IMPLANT
KIT TURNOVER KIT A (KITS) ×1 IMPLANT
LEGGING LITHOTOMY PAIR STRL (DRAPES) IMPLANT
LIGASURE IMPACT 36 18CM CVD LR (INSTRUMENTS) ×1 IMPLANT
PACK GENERAL/GYN (CUSTOM PROCEDURE TRAY) ×2 IMPLANT
PAD POSITIONING PINK XL (MISCELLANEOUS) ×1 IMPLANT
RELOAD PROXIMATE 75MM BLUE (ENDOMECHANICALS) ×4 IMPLANT
RELOAD STAPLE 75 3.8 BLU REG (ENDOMECHANICALS) IMPLANT
RETRACTOR WND ALEXIS 18 MED (MISCELLANEOUS) IMPLANT
RETRACTOR WND ALEXIS 25 LRG (MISCELLANEOUS) IMPLANT
RTRCTR WOUND ALEXIS 18CM MED (MISCELLANEOUS) ×2
RTRCTR WOUND ALEXIS 25CM LRG (MISCELLANEOUS) ×2
SEPRAFILM PROCEDURAL PACK 3X5 (MISCELLANEOUS) ×1 IMPLANT
STAPLER 90 3.5 STAND SLIM (STAPLE) ×2
STAPLER 90 3.5 STD SLIM (STAPLE) IMPLANT
STAPLER PROXIMATE 75MM BLUE (STAPLE) ×1 IMPLANT
STAPLER VISISTAT 35W (STAPLE) ×1 IMPLANT
SUCTION POOLE HANDLE (INSTRUMENTS) ×2
SUT ETHILON 3 0 PS 1 (SUTURE) IMPLANT
SUT NOVA 1 T20/GS 25DT (SUTURE) IMPLANT
SUT PDS AB 1 TP1 96 (SUTURE) ×2 IMPLANT
SUT SILK 2 0 (SUTURE) ×2
SUT SILK 2 0 SH CR/8 (SUTURE) ×2 IMPLANT
SUT SILK 2-0 18XBRD TIE 12 (SUTURE) ×1 IMPLANT
SUT SILK 3 0 (SUTURE)
SUT SILK 3 0 SH CR/8 (SUTURE) ×4 IMPLANT
SUT SILK 3-0 18XBRD TIE 12 (SUTURE) ×1 IMPLANT
SUT VIC AB 2-0 SH 18 (SUTURE) IMPLANT
SUT VIC AB 3-0 SH 18 (SUTURE) IMPLANT
SYS WOUND ALEXIS 18CM MED (MISCELLANEOUS) ×2
SYSTEM WOUND ALEXIS 18CM MED (MISCELLANEOUS) IMPLANT
TOWEL OR 17X26 10 PK STRL BLUE (TOWEL DISPOSABLE) ×3 IMPLANT
TOWEL OR NON WOVEN STRL DISP B (DISPOSABLE) ×2 IMPLANT
TRAY FOLEY MTR SLVR 14FR STAT (SET/KITS/TRAYS/PACK) IMPLANT
TRAY FOLEY MTR SLVR 16FR STAT (SET/KITS/TRAYS/PACK) ×1 IMPLANT

## 2021-10-20 NOTE — Progress Notes (Signed)
PHARMACY - TOTAL PARENTERAL NUTRITION CONSULT NOTE   Indication: bowel obstruction  Patient Measurements: Height: 5' 11"  (180.3 cm) Weight: 68.4 kg (150 lb 11.2 oz) IBW/kg (Calculated) : 75.3 TPN AdjBW (KG): 68.4 Body mass index is 21.02 kg/m.   Assessment:  66 yo male with hx HTN, HLD, prostate cancer s/p LUTS. S/p robotic assisted laparoscopic radical prostatectomy, bilateral robotic assisted laparoscopic pelvic lymphadenectomy, LOA, oversew of small bowel serosal injuries on 10/04/21. Patient on TPN 10/11/21-10/16/21. CT on 10/17/21 with SBO with transition slightly above umbilicus. Pharmacy consulted 10/18/21 to resume TPN.  Glucose / Insulin: No hx DM.  A1c 5. - CBGs at goal < 150 - 1 unit SSI required in past 24 hours Electrolytes: Goal K at least 4, Mg at least 2. K 3.7. All others WNL.    Renal: AKI, SCr now improved to WNL. BUN improved to 28.   Hepatic: AST improved to WNL. ALT elevated, but trending down. Alk Phos improved to WNL. Tbili WNL. Albumin low at 2.1.  Trig: WNL (last on 7/20) Intake / Output: NG replaced 7/23. 954m emesis/NG output. Urine output not documented.   MIVF: NS at 557mhr GI Imaging: 7/16: CT abd pelvis without abscess or fluid collection but concern for small bowel obstruction. 7/18: AXR: Findings are compatible with interval worsening of reported adynamic ileus 7/19: AXR 8hr delay: Findings compatible with un underlying partial small bowel obstruction. Oral contrast has reached the ascending colon >> 24hr delay shows persistent small bowel dilation measuring up to 4.8 cm.  Oral contrast throughout the colon. 7/23 CT abd pelvis with high-grade partial small bowel obstruction with marked dilation of proximal small bowel loops.  GI Surgeries / Procedures:  7/10: surgery as per HPI 7/26: exploratory laparotomy with lysis of adhesions, small bowel resection, repair of small bowel serosa, repair of ascending colon serosa   Central access: PICC 7/17 TPN  start date: 7/17-7/22, resumed 7/24  Nutritional Goals:  Goal TPN rate is 90 mL/hr (provides 112 g of protein and 2199 kcals per day)  RD Assessment:  Estimated Needs Total Energy Estimated Needs: 2100-2300 kcal Total Protein Estimated Needs: 105-120 grams Total Fluid Estimated Needs: >/= 2.3 L/day  Current Nutrition:  NPO  TPN  Plan:  Per discussion with RN in OR, TPN was disconnected on arrival to OR at 0914. Will hang D10W at same rate until new TPN bag hung this evening.   KCl 1078mIV x 2 doses now   At 1800: Resume TPN at 60 mL/hr Electrolytes in TPN:  Na 50m29m,  K 20mE40m Ca 3mEq/65m Mg 0mEq/L23mhos 10mmol/53ml:Ac Max Acetate Add standard MVI and trace elements to TPN Continue Sensitive q8h SSI and adjust as needed  Continue NS at 50 mL/hr  Monitor TPN labs on Mon/Thurs   Shatoria Stooksbury GaLindell Spar, BCPS Clinical Pharmacist  10/20/2021,8:35 AM

## 2021-10-20 NOTE — Transfer of Care (Signed)
Immediate Anesthesia Transfer of Care Note  Patient: Ivan Lopez  Procedure(s) Performed: EXPLORATORY LAPAROTOMY, LYSIS OF ADHESSIONS, SMALL BOWEL RESECTION, REPAIR OF SMALL BOWEL  Patient Location: PACU  Anesthesia Type:General  Level of Consciousness: awake, alert , oriented and patient cooperative  Airway & Oxygen Therapy: Patient Spontanous Breathing and Patient connected to face mask oxygen  Post-op Assessment: Report given to RN, Post -op Vital signs reviewed and stable and Patient moving all extremities X 4  Post vital signs: Reviewed and stable  Last Vitals:  Vitals Value Taken Time  BP 139/95 10/20/21 1210  Temp    Pulse 112 10/20/21 1214  Resp 13 10/20/21 1214  SpO2 100 % 10/20/21 1214  Vitals shown include unvalidated device data.  Last Pain:  Vitals:   10/20/21 0756  TempSrc:   PainSc: 0-No pain      Patients Stated Pain Goal: 3 (97/41/63 8453)  Complications: No notable events documented.

## 2021-10-20 NOTE — Anesthesia Postprocedure Evaluation (Signed)
Anesthesia Post Note  Patient: Ivan Lopez  Procedure(s) Performed: EXPLORATORY LAPAROTOMY, LYSIS OF ADHESSIONS, SMALL BOWEL RESECTION, REPAIR OF SMALL BOWEL     Patient location during evaluation: PACU Anesthesia Type: General Level of consciousness: awake and alert Pain management: pain level controlled Vital Signs Assessment: post-procedure vital signs reviewed and stable Respiratory status: spontaneous breathing, nonlabored ventilation, respiratory function stable and patient connected to nasal cannula oxygen Cardiovascular status: blood pressure returned to baseline and stable Postop Assessment: no apparent nausea or vomiting Anesthetic complications: no   No notable events documented.  Last Vitals:  Vitals:   10/20/21 1356 10/20/21 1412  BP: (!) 152/94   Pulse: (!) 121   Resp: 16 19  Temp: 36.8 C   SpO2: 97%     Last Pain:  Vitals:   10/20/21 1412  TempSrc:   PainSc: 10-Worst pain ever                 Tiajuana Amass

## 2021-10-20 NOTE — Anesthesia Preprocedure Evaluation (Signed)
Anesthesia Evaluation  Patient identified by MRN, date of birth, ID band Patient awake    Reviewed: Allergy & Precautions, NPO status , Patient's Chart, lab work & pertinent test results  Airway Mallampati: II  TM Distance: >3 FB Neck ROM: Full    Dental no notable dental hx.    Pulmonary neg pulmonary ROS,    Pulmonary exam normal breath sounds clear to auscultation       Cardiovascular negative cardio ROS Normal cardiovascular exam Rhythm:Regular Rate:Normal     Neuro/Psych negative neurological ROS  negative psych ROS   GI/Hepatic negative GI ROS, Neg liver ROS,   Endo/Other  negative endocrine ROS  Renal/GU negative Renal ROS  negative genitourinary   Musculoskeletal negative musculoskeletal ROS (+)   Abdominal   Peds negative pediatric ROS (+)  Hematology negative hematology ROS (+)   Anesthesia Other Findings   Reproductive/Obstetrics negative OB ROS                             Anesthesia Physical Anesthesia Plan  ASA: 3  Anesthesia Plan: General   Post-op Pain Management: Dilaudid IV   Induction: Intravenous, Rapid sequence and Cricoid pressure planned  PONV Risk Score and Plan: 2 and Ondansetron, Midazolam and Treatment may vary due to age or medical condition  Airway Management Planned: Oral ETT  Additional Equipment:   Intra-op Plan:   Post-operative Plan: Extubation in OR  Informed Consent: I have reviewed the patients History and Physical, chart, labs and discussed the procedure including the risks, benefits and alternatives for the proposed anesthesia with the patient or authorized representative who has indicated his/her understanding and acceptance.     Dental advisory given  Plan Discussed with: CRNA  Anesthesia Plan Comments:         Anesthesia Quick Evaluation

## 2021-10-20 NOTE — Anesthesia Procedure Notes (Signed)
Procedure Name: Intubation Date/Time: 10/20/2021 9:26 AM  Performed by: Jonna Munro, CRNAPre-anesthesia Checklist: Patient identified, Emergency Drugs available, Suction available, Patient being monitored and Timeout performed Patient Re-evaluated:Patient Re-evaluated prior to induction Oxygen Delivery Method: Circle system utilized Preoxygenation: Pre-oxygenation with 100% oxygen Induction Type: IV induction, Rapid sequence and Cricoid Pressure applied Laryngoscope Size: Mac and 4 Grade View: Grade I Tube type: Oral Tube size: 7.5 mm Number of attempts: 1 Airway Equipment and Method: Stylet Secured at: 23 cm Tube secured with: Tape Dental Injury: Teeth and Oropharynx as per pre-operative assessment

## 2021-10-20 NOTE — Progress Notes (Signed)
Urology Inpatient Progress Report  Prostate cancer ([place]) [C61]  Procedure(s): EXPLORATORY LAPAROTOMY, POSSIBLE SMALL BOWEL RESECTION  Day of Surgery   Intv/Subj: Vital signs stable overnight, NG output down slightly but still elevated and hiccupping/burping in room this AM despite NG decompression. Patient/family ready for surgery today.  Principal Problem:   Prostate cancer ([place])  Current Facility-Administered Medications  Medication Dose Route Frequency Provider Last Rate Last Admin   0.9 %  sodium chloride infusion   Intravenous Continuous Norm Parcel, PA-C 50 mL/hr at 10/20/21 0327 Infusion Verify at 10/20/21 0327   bisacodyl (DULCOLAX) suppository 10 mg  10 mg Rectal Daily Donnie Mesa, MD   10 mg at 10/19/21 1014   cefoTEtan (CEFOTAN) 2 g in sodium chloride 0.9 % 100 mL IVPB  2 g Intravenous Once Norm Parcel, PA-C       Chlorhexidine Gluconate Cloth 2 % PADS 6 each  6 each Topical Daily Raynelle Bring, MD   6 each at 10/19/21 1014   diphenhydrAMINE (BENADRYL) injection 12.5-25 mg  12.5-25 mg Intravenous Q6H PRN Raynelle Bring, MD       Or   diphenhydrAMINE (BENADRYL) 12.5 MG/5ML elixir 12.5-25 mg  12.5-25 mg Oral Q6H PRN Raynelle Bring, MD       diphenhydrAMINE (BENADRYL) injection 25 mg  25 mg Intravenous QHS PRN Norm Parcel, PA-C   25 mg at 10/18/21 2202   feeding supplement (ENSURE SURGERY) liquid 237 mL  237 mL Oral TID BM Norm Parcel, PA-C   237 mL at 10/15/21 2000   heparin injection 5,000 Units  5,000 Units Subcutaneous Q8H [name], MD   5,000 Units at 10/20/21 0549   hyoscyamine (LEVSIN SL) SL tablet 0.125 mg  0.125 mg Sublingual Q6H PRN Raynelle Bring, MD       insulin aspart (novoLOG) injection 0-9 Units  0-9 Units Subcutaneous Q8H Emiliano Dyer, RPH   1 Units at 10/19/21 1443   metoCLOPramide (REGLAN) injection 10 mg  10 mg Intravenous Q6H Alexis Frock, MD   10 mg at 10/20/21 0549   morphine (PF) 2 MG/ML injection 2-4 mg  2-4 mg  Intravenous Q3H PRN Barkley Boards R, PA-C   2 mg at 10/20/21 0553   neomycin-bacitracin-polymyxin [phone] OINT 1 Application  1 Application Topical TID PRN Raynelle Bring, MD       ondansetron [facility]) injection 4 mg  4 mg Intravenous Q4H PRN Raynelle Bring, MD   4 mg at 10/16/21 1744   Oral care mouth rinse  15 mL Mouth Rinse PRN Raynelle Bring, MD       pantoprazole (PROTONIX) injection 40 mg  40 mg Intravenous Q24H Alexis Frock, MD   40 mg at 10/19/21 2303   phenol (CHLORASEPTIC) mouth spray 1 spray  1 spray Mouth/Throat PRN Meuth, Brooke A, PA-C   1 spray at 10/19/21 1017   prochlorperazine (COMPAZINE) injection 10 mg  10 mg Intravenous Q6H PRN Marton Redwood III, MD   10 mg at 10/17/21 0758   simethicone (MYLICON) 40 JO/8.4ZY suspension 40 mg  40 mg Oral Q6H PRN Donnie Mesa, MD       sodium chloride flush (NS) 0.9 % injection 10-40 mL  10-40 mL Intracatheter PRN Raynelle Bring, MD   10 mL at 10/19/21 1801   TPN ADULT (ION)   Intravenous Continuous TPN Luiz Ochoa, RPH 60 mL/hr at 10/20/21 0327 Infusion Verify at 10/20/21 0327     Objective: Vital: Vitals:   10/19/21 1448 10/19/21  1518 10/19/21 2102 10/20/21 0553  BP:   127/70 128/72  Pulse:   89 98  Resp: '19 17 20 20  '$ Temp:   98.5 F (36.9 C) 98.9 F (37.2 C)  TempSrc:   Oral   SpO2:   98% 99%  Weight:      Height:       I/Os: I/O last 3 completed shifts: In: 2695.5 [I.V.:2695.5] Out: 1100 [Emesis/NG output:1100]  Physical Exam:  General: Patient is in no apparent distress Lungs: Normal respiratory effort, chest expands symmetrically. GI: The abdomen is soft, non distended. NG tube with thin brown/green output Ext: lower extremities symmetric  Lab Results: Recent Labs    10/18/21 0320  WBC 31.7*  HGB 11.1*  HCT 34.0*    Recent Labs    10/18/21 0320 10/19/21 0307 10/20/21 0213  NA 133* 139 141  K 5.1 4.2 3.7  CL 104 108 111  CO2 20* 22 24  GLUCOSE 110* 127* 135*  BUN 44* 39* 28*   CREATININE 1.78* 1.26* 0.99  CALCIUM 8.4* 8.4* 8.1*    No results for input(s): "LABPT", "INR" in the last 72 hours. No results for input(s): "LABURIN" in the last 72 hours. No results found for this or any previous visit.  Studies/Results: No results found.  Assessment: Moderate risk prostate cancer Ileus  Procedure(s): EXPLORATORY LAPAROTOMY, POSSIBLE SMALL BOWEL RESECTION, Day of Surgery  doing well.  Plan: - OR today for exploration with general surgery, appreciate their assistance - Continue TPN - Urology available in OR as needed

## 2021-10-20 NOTE — Op Note (Signed)
10/20/2021  12:01 PM  PATIENT:  Ivan Lopez  66 y.o. male  Patient Care Team: Nicoletta Dress, MD as PCP - General (Internal Medicine)  PRE-OPERATIVE DIAGNOSIS:  Small bowel obstruction  POST-OPERATIVE DIAGNOSIS:  Adhesive small bowel obstruction  PROCEDURE:   Exploratory laparotomy with lysis of adhesions x 100 minutes Small bowel resection Repair of small bowel serosa x 4 Repair of ascending colon serosa x 1  SURGEON:  Sharon Mt. Dema Severin, MD  ASSISTANT: Hinton Rao, MD - PGY-4, Urology  ANESTHESIA:   general  COUNTS:  Sponge, needle and instrument counts were reported correct x2 at the conclusion of the operation.  EBL: 50 mL  DRAINS: None  SPECIMEN: Small bowel  COMPLICATIONS: None  FINDINGS: Small bowel obstruction with obvious transition point entering into his right lower quadrant.  Dense intra-abdominal adhesions in the infraumbilical vicinity of his abdomen.  Chronicity of these is not completely clear at present but adhesiolysis was necessary to facilitate this procedure.  He was found to have more chronic appearing dense adhesions between his small bowel and ascending colon as well as the ascending colon mesentery with pepper like flex in this vicinity consistent with his known history of perforated appendicitis.  There are surgical staples visible at the base of the cecum consistent with a prior appendectomy.  Tedious adhesiolysis was carried out.  Multiple deserosalization's of the small bowel occurred which were inherent to the nature of the procedure.  These were all repaired.  Where his transition point was at the junction of the ascending colon and ascending colon mesentery, there was a deserosalization of the ascending colon that was also repaired.  In taking the small bowel down off of the colonic mesentery, there was 2 full-thickness enterotomies and closed proximity to 1 another.  Therefore, the small bowel was resected at this location.  DISPOSITION:  PACU in satisfactory condition  DESCRIPTION: The patient was identified in preop holding and taken to the OR where he was placed on the operating room table. SCDs were placed. General endotracheal anesthesia was induced without difficulty. He was then prepped and draped in the usual sterile fashion. A surgical timeout was performed indicating the correct patient, procedure, positioning and need for preoperative antibiotics.   Using his prior extraction site incision, the Dermabond is removed.  The skin is incised.  Subcutaneous tissue divided electrocautery.  Fading PDS sutures visualized.  This is removed.  The fascia is opened.  We began with cephalad injury going well above where his prior extraction site was to ensure we are away from any sort of acute adhesions.  The abdomen is able to be entered without any difficulty.  At the location of his prior extraction site, there are no significant adhesions.  In the umbilical infraumbilical position however there are fairly dense small bowel adhesions.  These were all carefully taken down sharply using Metzenbaum scissors.  We then placed an [place] wound protector.  The wound is opened to the full extent necessary to gain access to his right lower quadrant where there is an apparent transition point.  A Bookwalter retractor was placed.  Adhesions consisting of small bowel were taken down from the right lower quadrant peritoneum.  We then took down adhesions of the small bowel mesentery in this location.  There is an obvious transition point where the small bowel appears to have retracted into his pelvis and his prior adhesiolysis that then kinked off the small bowel in his right lower quadrant where it was adherent  to his prior perforated appendicitis/abscess cavity.  There were flecks of black in the plane between the small bowel and the colon at this location consistent with his history of perforated appendicitis.  We are able to carefully dissect the small  bowel from the ascending mesocolon as well as the ascending colon.  There were 2 full-thickness enterotomy that occurred within a centimeter of one another on the mid ileum.  This was inherent to the nature of the procedure and the tenacity of the adhesions.  We were able to get a pull tip sucker into the small intestine at this location and evacuated approximately 1050 mL of succus.  The small bowel was inspected.  The enterotomies were large enough and within close enough proximity that a repair is not felt to be feasible.  Therefore, we opted to proceed with a small bowel resection of this segment of the ileum.  Windows were created the mesentery at the junction of the small bowel.  The small bowel was then divided with GIA 75 mm blue load staplers.  The intervening mesentery is ligated and divided using a LigaSure device.  The cut edge of the mesentery is inspected and noted to be hemostatic.  The resected small bowel was passed off as specimen.  Attention was then directed at creating a small bowel anastomosis.  The antimesenteric corners of each respective staple in her trend.  A GIA 75 mm blue load stapler is used to create a enteroenterostomy.  The anastomosis is inspected and noted to be hemostatic.  The common enterotomy is then closed using a TA 90 blue load stapler after distracting the respective staple lines.  The anastomosis is palpated and is 4 fingerbreadths in diameter.  The corners of each TA staple line are then dunked using 2-0 silk suture.  A 3-0 silk stitch is placed at the apex of the anastomosis.  The mesenteric defect is then occluded using 3-0 silk sutures.  We then ran the small bowel from the ligament of Treitz all the way down to the ileocecal valve.  4 small areas of deserosalization were identified and are all repaired using Lembert stitches of 3-0 silk suture.  The cecum and ascending colon were all inspected.  Where his prior abscess cavity/phlegmon was located from his  perforated appendicitis, there is a questionable area of possible deserosalization of the descending colon versus this being the actual abscess cavity.  Nonetheless, we opted to repair this.  This was done using 3-0 silk Lembert sutures.  All repairs were inspected and noted to be intact with patent lumens.  We then ran the small bowel and colon again and found no other injuries.  The abdomen is irrigated with warm saline until the effluent runs clear.  Hemostasis is appreciated.  The midline fascia is closed using 2 running #1 looped PDS sutures, tied centrally.  All sponge, needle, and instrument counts are reported correct.  The skin is then closed using staples.  The wounds were washed and dried.  A honeycomb is applied.  He is then awakened from anesthesia, extubated, transferred to a stretcher for transport to PACU in satisfactory condition.

## 2021-10-20 NOTE — Progress Notes (Signed)
Day of Surgery   Subjective/Chief Complaint: Still high OP from NGT overnight. Had a fatty appearing BM yesterday. Having more upper abdominal pain this AM that feels crampy in nature. Throat irritated by NGT and hiccoughs again. Dr. Dema Severin discussed with Dr. Tresa Moore this AM and long discussion with patient and wife this AM. Patient does not seem to be improving at this point despite progress last week. Options are to proceed to surgery tomorrow or if we don't do this then patient would likely need to go home on TPN and wait several weeks-months to see if he improves with conservative therapies. Patient and wife concerned about risk of blood loss related to surgery as patient is a Jehovah's witness and would not accept any whole blood products. They are also concerned about risk of further obstruction and development of further scar tissue in the future. They do understand that he is not truly getting better at this point however and seem willing to proceed with reoperation tomorrow at this time.   Objective: Vital signs in last 24 hours: Temp:  [98.5 F (36.9 C)-98.9 F (37.2 C)] 98.9 F (37.2 C) (07/26 0553) Pulse Rate:  [89-98] 98 (07/26 0553) Resp:  [16-20] 20 (07/26 0553) BP: (114-128)/(67-72) 128/72 (07/26 0553) SpO2:  [97 %-99 %] 99 % (07/26 0553) Weight:  [68.4 kg] 68.4 kg (07/26 0756) Last BM Date : 10/18/21  Intake/Output from previous day: 07/25 0701 - 07/26 0700 In: 2695.5 [I.V.:2695.5] Out: 900 [Emesis/NG output:900] Intake/Output this shift: No intake/output data recorded.  PE: Gen:  Alert, NAD, pleasant Pulm:  Normal effort Abd: abdomen distended but soft, mild ttp in epigastric abdomen, BS hypoactive, NGT bilious  Skin: warm and dry, no rashes  Psych: A&Ox3   Lab Results:  Recent Labs    10/18/21 0320  WBC 31.7*  HGB 11.1*  HCT 34.0*  PLT 458*   BMET Recent Labs    10/19/21 0307 10/20/21 0213  NA 139 141  K 4.2 3.7  CL 108 111  CO2 22 24  GLUCOSE 127*  135*  BUN 39* 28*  CREATININE 1.26* 0.99  CALCIUM 8.4* 8.1*   PT/INR No results for input(s): "LABPROT", "INR" in the last 72 hours. ABG No results for input(s): "PHART", "HCO3" in the last 72 hours.  Invalid input(s): "PCO2", "PO2"  Studies/Results: No results found.  Anti-infectives: Anti-infectives (From admission, onward)    Start     Dose/Rate Route Frequency Ordered Stop   10/20/21 1000  [MAR Hold]  cefoTEtan (CEFOTAN) 2 g in sodium chloride 0.9 % 100 mL IVPB        (MAR Hold since Wed 10/20/2021 at 0743.Hold Reason: Transfer to a Procedural area)   2 g 200 mL/hr over 30 Minutes Intravenous  Once 10/19/21 1309     10/14/21 1430  sulfamethoxazole-trimethoprim (BACTRIM DS) 800-160 MG per tablet 1 tablet        1 tablet Oral Every 12 hours 10/14/21 1332 10/16/21 2152   10/04/21 2330  ceFAZolin (ANCEF) IVPB 1 g/50 mL premix        1 g 100 mL/hr over 30 Minutes Intravenous Every 8 hours 10/04/21 1737 10/05/21 1014   10/04/21 0922  ceFAZolin (ANCEF) IVPB 2g/100 mL premix        2 g 200 mL/hr over 30 Minutes Intravenous 30 min pre-op 10/04/21 0922 10/04/21 1530   10/04/21 0000  sulfamethoxazole-trimethoprim (BACTRIM DS) 800-160 MG tablet        1 tablet Oral 2 times daily 10/04/21 1048  Assessment/Plan: POD#16 S/P Robotic assisted laparoscopic radical prostatectomy (bilateral nerve sparing) Dr. Alinda Money Bilateral robotic assisted laparoscopic pelvic lymphadenectomy Dr. Alinda Money Laparoscopic lysis of adhesions, oversew of small bowel serosal injuries x2 Dr Brantley Stage    Post-operative ileus vs. Early SBO NG back in CT yesterday with SBO with transition slightly above umbilicus Scheduled Reglan Daily Dulcolax suppository Reordered TPN At this point patient does not seem to be improving with conservative management and we are reaching the point where either the patient needs reoperation or several weeks to months of TPN and potentially decompression. Pt and wife would  prefer to attempt reoperation at this time.  OR Today as discussed for exploratory laparotomy, possible adhesiolysis, possible bowel resection   ID - ancef 7/10 FEN - NPO, TPN, NGT to LIWS VTE - SCDs, SQH Foley - removed last week by uro   LOS: 15 days    Ivan Lopez 10/20/2021

## 2021-10-21 ENCOUNTER — Encounter (HOSPITAL_COMMUNITY): Payer: Self-pay | Admitting: Surgery

## 2021-10-21 LAB — CBC
HCT: 35.4 % — ABNORMAL LOW (ref 39.0–52.0)
Hemoglobin: 11.5 g/dL — ABNORMAL LOW (ref 13.0–17.0)
MCH: 29.8 pg (ref 26.0–34.0)
MCHC: 32.5 g/dL (ref 30.0–36.0)
MCV: 91.7 fL (ref 80.0–100.0)
Platelets: 485 10*3/uL — ABNORMAL HIGH (ref 150–400)
RBC: 3.86 MIL/uL — ABNORMAL LOW (ref 4.22–5.81)
RDW: 14.6 % (ref 11.5–15.5)
WBC: 15.6 10*3/uL — ABNORMAL HIGH (ref 4.0–10.5)
nRBC: 0 % (ref 0.0–0.2)

## 2021-10-21 LAB — COMPREHENSIVE METABOLIC PANEL
ALT: 97 U/L — ABNORMAL HIGH (ref 0–44)
AST: 20 U/L (ref 15–41)
Albumin: 1.9 g/dL — ABNORMAL LOW (ref 3.5–5.0)
Alkaline Phosphatase: 96 U/L (ref 38–126)
Anion gap: 7 (ref 5–15)
BUN: 22 mg/dL (ref 8–23)
CO2: 23 mmol/L (ref 22–32)
Calcium: 7.8 mg/dL — ABNORMAL LOW (ref 8.9–10.3)
Chloride: 106 mmol/L (ref 98–111)
Creatinine, Ser: 1.04 mg/dL (ref 0.61–1.24)
GFR, Estimated: >60 mL/min (ref >60)
Glucose, Bld: 152 mg/dL — ABNORMAL HIGH (ref 70–99)
Potassium: 4.4 mmol/L (ref 3.5–5.1)
Sodium: 136 mmol/L (ref 135–145)
Total Bilirubin: 0.4 mg/dL (ref 0.3–1.2)
Total Protein: 5.7 g/dL — ABNORMAL LOW (ref 6.5–8.1)

## 2021-10-21 LAB — TRIGLYCERIDES: Triglycerides: 51 mg/dL (ref <150)

## 2021-10-21 LAB — MAGNESIUM: Magnesium: 1.8 mg/dL (ref 1.7–2.4)

## 2021-10-21 LAB — PHOSPHORUS: Phosphorus: 3.3 mg/dL (ref 2.5–4.6)

## 2021-10-21 LAB — GLUCOSE, CAPILLARY
Glucose-Capillary: 155 mg/dL — ABNORMAL HIGH (ref 70–99)
Glucose-Capillary: 194 mg/dL — ABNORMAL HIGH (ref 70–99)
Glucose-Capillary: 176 mg/dL — ABNORMAL HIGH (ref 70–99)

## 2021-10-21 MED ORDER — MAGNESIUM SULFATE 2 GM/50ML IV SOLN
2.0000 g | Freq: Once | INTRAVENOUS | Status: AC
  Administered 2021-10-21: 2 g via INTRAVENOUS
  Filled 2021-10-21: qty 50

## 2021-10-21 MED ORDER — SODIUM CHLORIDE 0.9 % IV SOLN: INTRAVENOUS | Status: AC

## 2021-10-21 MED ORDER — TRAVASOL 10 % IV SOLN
INTRAVENOUS | Status: AC
  Filled 2021-10-21: qty 936

## 2021-10-21 MED ORDER — ACETAMINOPHEN 10 MG/ML IV SOLN
1000.0000 mg | Freq: Four times a day (QID) | INTRAVENOUS | Status: AC
  Administered 2021-10-21: 1000 mg via INTRAVENOUS
  Filled 2021-10-21: qty 100

## 2021-10-21 NOTE — Evaluation (Signed)
Physical Therapy Evaluation Patient Details Name: Ivan Lopez MRN: [id number] DOB: 1955-05-30 Today's Date: 10/21/2021  History of Present Illness  Pt is a 66 year old male s/p robotic assisted laparoscopic prostatectomy Dr. Alinda Money, and laparoscopic LOA with oversew of small bowel serosal injuries x2 Dr. Brantley Stage on 10/04/21.  Pt is also s/p EXPLORATORY LAPAROTOMY, LYSIS OF ADHESSIONS, SMALL BOWEL RESECTION, REPAIR OF SMALL BOWEL on 10/20/21.  Clinical Impression  Pt admitted with above diagnosis.  Pt currently with functional limitations due to the deficits listed below (see PT Problem List). Pt will benefit from skilled PT to increase their independence and safety with mobility to allow discharge to the venue listed below.  Pt with multiple lines and cautiously mobilizing.  Pt pleasant and motivated.  Per chart review, pt visited and established PT POC at [facility] clinic just prior to this admission so recommending resuming upon d/c.        Recommendations for follow up therapy are one component of a multi-disciplinary discharge planning process, led by the attending physician.  Recommendations may be updated based on patient status, additional functional criteria and insurance authorization.  Follow Up Recommendations Outpatient PT (resume OPPT) (best option would be pelvic PT)      Assistance Recommended at Discharge PRN  Patient can return home with the following  A little help with walking and/or transfers;A little help with bathing/dressing/bathroom    Equipment Recommendations None recommended by PT  Recommendations for Other Services       Functional Status Assessment Patient has had a recent decline in their functional status and demonstrates the ability to make significant improvements in function in a reasonable and predictable amount of time.     Precautions / Restrictions Precautions Precautions: Other (comment) Precaution Comments: TPN, NGT, PCA      Mobility  Bed  Mobility               General bed mobility comments: pt in recliner    Transfers Overall transfer level: Needs assistance Equipment used: Rolling walker (2 wheels) Transfers: Sit to/from Stand Sit to Stand: Min guard           General transfer comment: assist for multiple lines, pt cautious and self assists with hands on armrests    Ambulation/Gait Ambulation/Gait assistance: Min guard Gait Distance (Feet): 360 Feet Assistive device: Rolling walker (2 wheels) Gait Pattern/deviations: Step-through pattern, Decreased stride length, Trunk flexed Gait velocity: decr     General Gait Details: cues for posture, slow but steady pace with RW, pt denies increase in pain  Stairs            Wheelchair Mobility    Modified Rankin (Stroke Patients Only)       Balance Overall balance assessment: No apparent balance deficits (not formally assessed)                                           Pertinent Vitals/Pain Pain Assessment Pain Assessment: Faces Faces Pain Scale: Hurts little more Pain Location: abdomen Pain Descriptors / Indicators: Grimacing, Operative site guarding Pain Intervention(s): Repositioned, Monitored during session, PCA encouraged    Home Living Family/patient expects to be discharged to:: Private residence Living Arrangements: Spouse/significant other   Type of Home: House Home Access: Stairs to enter Entrance Stairs-Rails: Right Entrance Stairs-Number of Steps: "a few"     Home Equipment: Conservation officer, nature (2 wheels)  Prior Function Prior Level of Function : Independent/Modified Independent                     Hand Dominance        Extremity/Trunk Assessment        Lower Extremity Assessment Lower Extremity Assessment: Generalized weakness       Communication   Communication: No difficulties  Cognition Arousal/Alertness: Awake/alert Behavior During Therapy: WFL for tasks  assessed/performed Overall Cognitive Status: Within Functional Limits for tasks assessed                                          General Comments      Exercises     Assessment/Plan    PT Assessment Patient needs continued PT services  PT Problem List Decreased strength;Decreased mobility;Decreased knowledge of use of DME       PT Treatment Interventions DME instruction;Gait training;Therapeutic exercise;Balance training;Functional mobility training;Therapeutic activities;Patient/family education    PT Goals (Current goals can be found in the Care Plan section)  Acute Rehab PT Goals PT Goal Formulation: With patient Time For Goal Achievement: 11/04/21 Potential to Achieve Goals: Good    Frequency Min 3X/week     Co-evaluation               AM-PAC PT "6 Clicks" Mobility  Outcome Measure Help needed turning from your back to your side while in a flat bed without using bedrails?: A Little Help needed moving from lying on your back to sitting on the side of a flat bed without using bedrails?: A Little Help needed moving to and from a bed to a chair (including a wheelchair)?: A Little Help needed standing up from a chair using your arms (e.g., wheelchair or bedside chair)?: A Little Help needed to walk in hospital room?: A Little Help needed climbing 3-5 steps with a railing? : A Lot 6 Click Score: 17    End of Session Equipment Utilized During Treatment: Gait belt Activity Tolerance: Patient tolerated treatment well Patient left: in chair;with call bell/phone within reach;with family/visitor present   PT Visit Diagnosis: Difficulty in walking, not elsewhere classified (R26.2);Muscle weakness (generalized) (M62.81)    Time: 8119-1478 PT Time Calculation (min) (ACUTE ONLY): 32 min   Charges:   PT Evaluation $PT Eval Low Complexity: 1 Low        Kati PT, DPT Physical Therapist Acute Rehabilitation Services Preferred contact method: Secure  Chat Weekend Pager Only: [phone] Office: [place] 10/21/2021, 4:12 PM

## 2021-10-21 NOTE — Progress Notes (Signed)
PHARMACY - TOTAL PARENTERAL NUTRITION CONSULT NOTE   Indication: bowel obstruction  Patient Measurements: Height: 5' 11"  (180.3 cm) Weight: 68.4 kg (150 lb 11.2 oz) IBW/kg (Calculated) : 75.3 TPN AdjBW (KG): 68.4 Body mass index is 21.02 kg/m.   Assessment:  66 yo male with hx HTN, HLD, prostate cancer s/p LUTS. S/p robotic assisted laparoscopic radical prostatectomy, bilateral robotic assisted laparoscopic pelvic lymphadenectomy, LOA, oversew of small bowel serosal injuries on 10/04/21. Patient on TPN 10/11/21-10/16/21. CT on 10/17/21 with SBO with transition slightly above umbilicus. Pharmacy consulted 10/18/21 to resume TPN.  Glucose / Insulin: No hx DM.  A1c 5. - CBGs 119-168 - 6 units SSI required in past 24 hours Electrolytes: Goal K at least 4, Mg at least 2. All others WNL except mag 1.8.    Renal: AKI, SCr now improved to WNL. BUN improved to 28.   Hepatic: AST improved to WNL. ALT continues to trend down. Alk Phos improved to WNL. Tbili WNL. Albumin low at 2.1.  Trig: WNL (last on 7/27) Intake / Output: NG replaced 7/23. 930m emesis/NG output. Urine output not documented.   MIVF: NS at 512mhr GI Imaging: 7/16: CT abd pelvis without abscess or fluid collection but concern for small bowel obstruction. 7/18: AXR: Findings are compatible with interval worsening of reported adynamic ileus 7/19: AXR 8hr delay: Findings compatible with un underlying partial small bowel obstruction. Oral contrast has reached the ascending colon >> 24hr delay shows persistent small bowel dilation measuring up to 4.8 cm.  Oral contrast throughout the colon. 7/23 CT abd pelvis with high-grade partial small bowel obstruction with marked dilation of proximal small bowel loops.  GI Surgeries / Procedures:  7/10: surgery as per HPI 7/26: exploratory laparotomy with lysis of adhesions, small bowel resection, repair of small bowel serosa, repair of ascending colon serosa   Central access: PICC 7/17 TPN start  date: 7/17-7/22, resumed 7/24  Nutritional Goals:  Goal TPN rate is 90 mL/hr (provides 112 g of protein and 2199 kcals per day)  RD Assessment:  Estimated Needs Total Energy Estimated Needs: 2100-2300 kcal Total Protein Estimated Needs: 105-120 grams Total Fluid Estimated Needs: >/= 2.3 L/day  Current Nutrition:  NPO  TPN  Plan:  Now: Mag sulfate 2 g IV x 1  At 1800: Increase TPN from 60 mL/hr to 75 ml/hr Electrolytes in TPN:  Na 7078mL K 69m64m Ca 3mEq59m Mg 5mEq/71madd back 7/27) Phos 10mmol68mCl:Ac Max Acetate Add standard MVI and trace elements to TPN Continue Sensitive q8h SSI and adjust as needed  Reduce NaCl IVF from 50 to 30 ml/hr Monitor TPN labs on Mon/Thurs   Lovell Roe Adrian SaranD, BCPS Secure Chat if ?s or find phone # per room assignments 10/21/2021 10:01 AM

## 2021-10-21 NOTE — Progress Notes (Signed)
Urology Inpatient Progress Report  Prostate cancer ([place]) [C61]  Procedure(s): EXPLORATORY LAPAROTOMY, LYSIS OF ADHESSIONS, SMALL BOWEL RESECTION, REPAIR OF SMALL BOWEL  1 Day Post-Op   Intv/Subj: OR yesterday ex-lap. Findings notable for very adhered small bowel in right lower quadrant, likely combination of acute and chronic adhesions/scar tissue from prostatectomy and prior open appendectomy. Adhered bowel removed from colon without issue, segment of small bowel removed and re-anastomosed. Doing well this AM, some tachycardia post op which has now resolved. Very minimal NG output. Abdomen soft/appropriately tender  Principal Problem:   Prostate cancer ([place])  Current Facility-Administered Medications  Medication Dose Route Frequency Provider Last Rate Last Admin   0.9 %  sodium chloride infusion   Intravenous Continuous Norm Parcel, PA-C 50 mL/hr at 10/20/21 1412 New Bag at 10/20/21 1412   acetaminophen (OFIRMEV) IV 1,000 mg  1,000 mg Intravenous Q6H Norm Parcel, PA-C 400 mL/hr at 10/21/21 0600 1,000 mg at 10/21/21 0600   Chlorhexidine Gluconate Cloth 2 % PADS 6 each  6 each Topical Daily Norm Parcel, PA-C   6 each at 10/19/21 1014   diphenhydrAMINE (BENADRYL) injection 12.5 mg  12.5 mg Intravenous Q6H PRN Ileana Roup, MD       Or   diphenhydrAMINE (BENADRYL) 12.5 MG/5ML elixir 12.5 mg  12.5 mg Oral Q6H PRN Ileana Roup, MD       diphenhydrAMINE (BENADRYL) injection 25 mg  25 mg Intravenous QHS PRN Norm Parcel, PA-C   25 mg at 10/18/21 2202   heparin injection 5,000 Units  5,000 Units Subcutaneous Q8H Norm Parcel, PA-C   5,000 Units at 10/21/21 0559   HYDROmorphone (DILAUDID) 1 mg/mL PCA injection   Intravenous Q4H Ileana Roup, MD   2.8 mg at 10/21/21 0409   hyoscyamine (LEVSIN SL) SL tablet 0.125 mg  0.125 mg Sublingual Q6H PRN Norm Parcel, PA-C       insulin aspart (novoLOG) injection 0-9 Units  0-9 Units Subcutaneous Q8H Norm Parcel, PA-C   2 Units at 10/21/21 3810   methocarbamol (ROBAXIN) 1,000 mg in dextrose 5 % 100 mL IVPB  1,000 mg Intravenous Q8H Norm Parcel, PA-C 220 mL/hr at 10/20/21 2227 1,000 mg at 10/20/21 2227   metoCLOPramide (REGLAN) injection 10 mg  10 mg Intravenous Q6H Barkley Boards R, PA-C   10 mg at 10/21/21 0559   naloxone [facility]) injection 0.4 mg  0.4 mg Intravenous PRN Ileana Roup, MD       And   sodium chloride flush (NS) 0.9 % injection 9 mL  9 mL Intravenous PRN Ileana Roup, MD       neomycin-bacitracin-polymyxin [phone] OINT 1 Application  1 Application Topical TID PRN Norm Parcel, PA-C       ondansetron [facility]) injection 4 mg  4 mg Intravenous Q6H PRN Ileana Roup, MD       Oral care mouth rinse  15 mL Mouth Rinse PRN Norm Parcel, PA-C       pantoprazole (PROTONIX) injection 40 mg  40 mg Intravenous Q24H Norm Parcel, PA-C   40 mg at 10/20/21 2224   phenol (CHLORASEPTIC) mouth spray 1 spray  1 spray Mouth/Throat PRN Norm Parcel, PA-C   1 spray at 10/19/21 1017   prochlorperazine (COMPAZINE) injection 10 mg  10 mg Intravenous Q6H PRN Barkley Boards R, PA-C   10 mg at 10/17/21 0758   simethicone (MYLICON) 40 ID/7.8EU suspension 40 mg  40 mg Oral Q6H PRN Norm Parcel, PA-C       sodium chloride flush (NS) 0.9 % injection 10-40 mL  10-40 mL Intracatheter PRN Norm Parcel, PA-C   10 mL at 10/19/21 1801   TPN ADULT (ION)   Intravenous Continuous TPN Luiz Ochoa, RPH 60 mL/hr at 10/20/21 1913 New Bag at 10/20/21 1913     Objective: Vital: Vitals:   10/21/21 0020 10/21/21 0208 10/21/21 0409 10/21/21 0607  BP:  137/87  136/77  Pulse:  97  96  Resp: 16 18 (!) 8 18  Temp:  98.9 F (37.2 C)  98.5 F (36.9 C)  TempSrc:  Oral  Oral  SpO2: 95% 98% 96% 97%  Weight:      Height:       I/Os: I/O last 3 completed shifts: In: 3347.7 [I.V.:2909.8; NG/GT:30; IV Piggyback:407.9] Out: 425 [Urine:75; Emesis/NG output:300;  Blood:50]  Physical Exam:  General: Patient is in no apparent distress Lungs: Normal respiratory effort, chest expands symmetrically. GI: The abdomen is soft, non distended. NG tube with minimal thin brown/green output. Incision with small amount of strikethrough, well appearing Ext: lower extremities symmetric  Lab Results: Recent Labs    10/21/21 0337  WBC 15.6*  HGB 11.5*  HCT 35.4*    Recent Labs    10/19/21 0307 10/20/21 0213 10/21/21 0337  NA 139 141 136  K 4.2 3.7 4.4  CL 108 111 106  CO2 '22 24 23  '$ GLUCOSE 127* 135* 152*  BUN 39* 28* 22  CREATININE 1.26* 0.99 1.04  CALCIUM 8.4* 8.1* 7.8*    No results for input(s): "LABPT", "INR" in the last 72 hours. No results for input(s): "LABURIN" in the last 72 hours. No results found for this or any previous visit.  Studies/Results: No results found.  Assessment: Moderate risk prostate cancer I[place] s/p small bowel resection  Procedure(s): EXPLORATORY LAPAROTOMY, LYSIS OF ADHESSIONS, SMALL BOWEL RESECTION, REPAIR OF SMALL BOWEL, 1 Day Post-Op  doing well.  Plan: - Appreciate general surgery assistance and management of NG/diet - Continue foley until NG removed - urology will continue to follow

## 2021-10-21 NOTE — Progress Notes (Signed)
Progress Note  1 Day Post-Op  Subjective: Pt tired this AM but motivated. Wife at bedside. No flatus yet. PCA helping with pain control.   Objective: Vital signs in last 24 hours: Temp:  [98.1 F (36.7 C)-98.9 F (37.2 C)] 98.5 F (36.9 C) (07/27 0607) Pulse Rate:  [96-121] 96 (07/27 0607) Resp:  [8-20] 18 (07/27 0817) BP: (127-152)/(77-95) 136/77 (07/27 0607) SpO2:  [94 %-100 %] 96 % (07/27 0817) Last BM Date : 10/18/21  Intake/Output from previous day: 07/26 0701 - 07/27 0700 In: 2295.1 [I.V.:1857.2; NG/GT:30; IV Piggyback:407.9] Out: 125 [Urine:75; Blood:50] Intake/Output this shift: No intake/output data recorded.  PE: Gen:  Alert, NAD, pleasant Pulm:  Normal effort Abd: abdomen distended but soft, appropriately ttp, honeycomb dressing to midline incision with some blood present, NGT with light bilious drainage, BS hypoactive Skin: warm and dry, no rashes  Psych: A&Ox3    Lab Results:  Recent Labs    10/21/21 0337  WBC 15.6*  HGB 11.5*  HCT 35.4*  PLT 485*   BMET Recent Labs    10/20/21 0213 10/21/21 0337  NA 141 136  K 3.7 4.4  CL 111 106  CO2 24 23  GLUCOSE 135* 152*  BUN 28* 22  CREATININE 0.99 1.04  CALCIUM 8.1* 7.8*   PT/INR No results for input(s): "LABPROT", "INR" in the last 72 hours. CMP     Component Value Date/Time   NA 136 10/21/2021 0337   K 4.4 10/21/2021 0337   CL 106 10/21/2021 0337   CO2 23 10/21/2021 0337   GLUCOSE 152 (H) 10/21/2021 0337   BUN 22 10/21/2021 0337   CREATININE 1.04 10/21/2021 0337   CALCIUM 7.8 (L) 10/21/2021 0337   PROT 5.7 (L) 10/21/2021 0337   ALBUMIN 1.9 (L) 10/21/2021 0337   AST 20 10/21/2021 0337   ALT 97 (H) 10/21/2021 0337   ALKPHOS 96 10/21/2021 0337   BILITOT 0.4 10/21/2021 0337   GFRNONAA >60 10/21/2021 0337   Lipase  No results found for: "LIPASE"     Studies/Results: No results found.  Anti-infectives: Anti-infectives (From admission, onward)    Start     Dose/Rate Route  Frequency Ordered Stop   10/20/21 1000  cefoTEtan (CEFOTAN) 2 g in sodium chloride 0.9 % 100 mL IVPB        2 g 200 mL/hr over 30 Minutes Intravenous  Once 10/19/21 1309 10/20/21 1018   10/20/21 0946  sodium chloride 0.9 % with cefoTEtan (CEFOTAN) ADS Med       Note to Pharmacy: Karsten Ro: cabinet override      10/20/21 0946 10/20/21 0949   10/14/21 1430  sulfamethoxazole-trimethoprim (BACTRIM DS) 800-160 MG per tablet 1 tablet        1 tablet Oral Every 12 hours 10/14/21 1332 10/16/21 2152   10/04/21 2330  ceFAZolin (ANCEF) IVPB 1 g/50 mL premix        1 g 100 mL/hr over 30 Minutes Intravenous Every 8 hours 10/04/21 1737 10/05/21 1014   10/04/21 0922  ceFAZolin (ANCEF) IVPB 2g/100 mL premix        2 g 200 mL/hr over 30 Minutes Intravenous 30 min pre-op 10/04/21 0922 10/04/21 1530   10/04/21 0000  sulfamethoxazole-trimethoprim (BACTRIM DS) 800-160 MG tablet        1 tablet Oral 2 times daily 10/04/21 1048          Assessment/Plan POD17 robotic assisted laparoscopic prostatectomy Dr. Alinda Money, and laparoscopic LOA with oversew of small bowel serosal injuries  x2 Dr. Brantley Stage POD1 exploratory laparotomy with LOA and small bowel resection - no flatus yet - continue NGT on LIWS and TPN - start to mobilize as able - likely remove foley today, ok to use external catheter to help control any urinary leakage - PCA for pain control and IV tylenol and IV robaxin - we will assume primary care for this patient at this point   FEN: NPO, TPN, NGT to LIWS VTE: SQH ID: cefotetan yesterday  Foley: likely remove today   LOS: 16 days     Norm Parcel, [facility] 10/21/2021, 11:17 AM Please see Amion for pager number during day hours 7:00am-4:30pm

## 2021-10-22 ENCOUNTER — Inpatient Hospital Stay (HOSPITAL_COMMUNITY): Payer: Medicare Other

## 2021-10-22 LAB — BASIC METABOLIC PANEL
Anion gap: 7 (ref 5–15)
BUN: 25 mg/dL — ABNORMAL HIGH (ref 8–23)
CO2: 22 mmol/L (ref 22–32)
Calcium: 8.1 mg/dL — ABNORMAL LOW (ref 8.9–10.3)
Chloride: 109 mmol/L (ref 98–111)
Creatinine, Ser: 1.12 mg/dL (ref 0.61–1.24)
GFR, Estimated: >60 mL/min (ref >60)
Glucose, Bld: 158 mg/dL — ABNORMAL HIGH (ref 70–99)
Potassium: 3.9 mmol/L (ref 3.5–5.1)
Sodium: 138 mmol/L (ref 135–145)

## 2021-10-22 LAB — GLUCOSE, CAPILLARY
Glucose-Capillary: 165 mg/dL — ABNORMAL HIGH (ref 70–99)
Glucose-Capillary: 166 mg/dL — ABNORMAL HIGH (ref 70–99)
Glucose-Capillary: 133 mg/dL — ABNORMAL HIGH (ref 70–99)

## 2021-10-22 LAB — SURGICAL PATHOLOGY

## 2021-10-22 LAB — PHOSPHORUS: Phosphorus: 1.7 mg/dL — ABNORMAL LOW (ref 2.5–4.6)

## 2021-10-22 LAB — MAGNESIUM: Magnesium: 2.2 mg/dL (ref 1.7–2.4)

## 2021-10-22 MED ORDER — LACTATED RINGERS IV BOLUS: 500.0000 mL | Freq: Once | INTRAVENOUS | Status: AC

## 2021-10-22 MED ORDER — TRAVASOL 10 % IV SOLN
INTRAVENOUS | Status: AC
  Filled 2021-10-22: qty 936

## 2021-10-22 MED ORDER — LACTATED RINGERS IV BOLUS
1000.0000 mL | Freq: Once | INTRAVENOUS | Status: DC
Start: 2021-10-22 — End: 2021-10-22

## 2021-10-22 MED ORDER — POTASSIUM PHOSPHATES 15 MMOLE/5ML IV SOLN
18.0000 mmol | Freq: Once | INTRAVENOUS | Status: AC
  Administered 2021-10-22: 18 mmol via INTRAVENOUS
  Filled 2021-10-22: qty 6

## 2021-10-22 MED ORDER — ACETAMINOPHEN 10 MG/ML IV SOLN
1000.0000 mg | Freq: Four times a day (QID) | INTRAVENOUS | Status: AC
  Administered 2021-10-22: 1000 mg via INTRAVENOUS
  Filled 2021-10-22: qty 100

## 2021-10-22 MED ORDER — LACTATED RINGERS IV BOLUS
500.0000 mL | Freq: Once | INTRAVENOUS | Status: AC
Start: 2021-10-22 — End: 2021-10-22
  Administered 2021-10-22: 500 mL via INTRAVENOUS

## 2021-10-22 NOTE — Progress Notes (Signed)
Patient ID: Ivan Lopez, male   DOB: 08/03/1955, 66 y.o.   MRN: [id number] [facility] Surgery Progress Note  2 Days Post-Op  Subjective: CC-  Having increased abdominal pain today compared to yesterday. A little more bloated. No NG tube output. Denies n/v. No flatus or BM.  Objective: Vital signs in last 24 hours: Temp:  [98.1 F (36.7 C)-100.1 F (37.8 C)] 100.1 F (37.8 C) (07/28 1026) Pulse Rate:  [110-118] 117 (07/28 1026) Resp:  [13-22] 21 (07/28 1026) BP: (116-135)/(69-78) 126/73 (07/28 1026) SpO2:  [95 %-99 %] 99 % (07/28 1026) Last BM Date : 10/18/21  Intake/Output from previous day: 07/27 0701 - 07/28 0700 In: 1195.3 [I.V.:975.3; IV Piggyback:220] Out: 0  Intake/Output this shift: No intake/output data recorded.  PE: Gen:  Alert, NAD, pleasant Pulm:  Normal effort, pulling 1000 on IS Abd: abdomen distended but soft, diffuse tenderness with some voluntary guarding, honeycomb dressing to midline incision with some blood present, BS hypoactive Skin: warm and dry, no rashes  Psych: A&Ox3   Lab Results:  Recent Labs    10/21/21 0337  WBC 15.6*  HGB 11.5*  HCT 35.4*  PLT 485*   BMET Recent Labs    10/21/21 0337 10/22/21 0244  NA 136 138  K 4.4 3.9  CL 106 109  CO2 23 22  GLUCOSE 152* 158*  BUN 22 25*  CREATININE 1.04 1.12  CALCIUM 7.8* 8.1*   PT/INR No results for input(s): "LABPROT", "INR" in the last 72 hours. CMP     Component Value Date/Time   NA 138 10/22/2021 0244   K 3.9 10/22/2021 0244   CL 109 10/22/2021 0244   CO2 22 10/22/2021 0244   GLUCOSE 158 (H) 10/22/2021 0244   BUN 25 (H) 10/22/2021 0244   CREATININE 1.12 10/22/2021 0244   CALCIUM 8.1 (L) 10/22/2021 0244   PROT 5.7 (L) 10/21/2021 0337   ALBUMIN 1.9 (L) 10/21/2021 0337   AST 20 10/21/2021 0337   ALT 97 (H) 10/21/2021 0337   ALKPHOS 96 10/21/2021 0337   BILITOT 0.4 10/21/2021 0337   GFRNONAA >60 10/22/2021 0244   Lipase  No results found for:  "LIPASE"     Studies/Results: No results found.  Anti-infectives: Anti-infectives (From admission, onward)    Start     Dose/Rate Route Frequency Ordered Stop   10/20/21 1000  cefoTEtan (CEFOTAN) 2 g in sodium chloride 0.9 % 100 mL IVPB        2 g 200 mL/hr over 30 Minutes Intravenous  Once 10/19/21 1309 10/20/21 1018   10/20/21 0946  sodium chloride 0.9 % with cefoTEtan (CEFOTAN) ADS Med       Note to Pharmacy: Karsten Ro: cabinet override      10/20/21 0946 10/20/21 0949   10/14/21 1430  sulfamethoxazole-trimethoprim (BACTRIM DS) 800-160 MG per tablet 1 tablet        1 tablet Oral Every 12 hours 10/14/21 1332 10/16/21 2152   10/04/21 2330  ceFAZolin (ANCEF) IVPB 1 g/50 mL premix        1 g 100 mL/hr over 30 Minutes Intravenous Every 8 hours 10/04/21 1737 10/05/21 1014   10/04/21 0922  ceFAZolin (ANCEF) IVPB 2g/100 mL premix        2 g 200 mL/hr over 30 Minutes Intravenous 30 min pre-op 10/04/21 0922 10/04/21 1530   10/04/21 0000  sulfamethoxazole-trimethoprim (BACTRIM DS) 800-160 MG tablet        1 tablet Oral 2 times daily 10/04/21 1048  Assessment/Plan -POD#18 robotic assisted laparoscopic prostatectomy Dr. Alinda Money, and laparoscopic LOA with oversew of small bowel serosal injuries x2 Dr. Brantley Stage 7/10 -POD#2 exploratory laparotomy with LOA and small bowel resection Dr. Dema Severin 7/26 - await return in bowel function - continue NGT to LIWS and TPN. Check abdominal film - he has been a little more tachy and with low grade temp this morning. Encourage IS. Check CXR. CBC pending - mobilize as able - continue PCA for pain control and IV tylenol and IV robaxin   FEN: NPO, TPN, NGT to LIWS. Replete P VTE: SQH ID: cefotetan 7/26  Foley: out 7/27 and voiding    LOS: 17 days    Wellington Hampshire, [facility] Surgery 10/22/2021, 11:32 AM Please see Amion for pager number during day hours 7:00am-4:30pm

## 2021-10-22 NOTE — Progress Notes (Signed)
PHARMACY - TOTAL PARENTERAL NUTRITION CONSULT NOTE   Indication: bowel obstruction  Patient Measurements: Height: 5' 11"  (180.3 cm) Weight: 68.4 kg (150 lb 11.2 oz) IBW/kg (Calculated) : 75.3 TPN AdjBW (KG): 68.4 Body mass index is 21.02 kg/m.   Assessment:  66 yo male with hx HTN, HLD, prostate cancer s/p LUTS. S/p robotic assisted laparoscopic radical prostatectomy, bilateral robotic assisted laparoscopic pelvic lymphadenectomy, LOA, oversew of small bowel serosal injuries on 10/04/21. Patient on TPN 10/11/21-10/16/21. CT on 10/17/21 with SBO with transition slightly above umbilicus. Pharmacy consulted 10/18/21 to resume TPN.  Glucose / Insulin: No hx DM.  A1c 5. - q8h CBGs 152-176 - 6 units SSI required in past 24 hours Electrolytes: Goal K at least 4, Mg at least 2.  K down 3.9, phos down 1.7; others WNL   Anion gap WNL, CO2 WNL and Cl WNL. Renal: AKI resolved, SCr now WNL. BUN improved to 25.   Hepatic: AST improved to WNL. ALT continues to trend down. Alk Phos improved to WNL. Tbili WNL. Albumin low at 1.9.  Trig: WNL (last on 7/27) Intake / Output: NG replaced 7/23. Emesis/NG output not documented. Urine output not documented MIVF: NS at 48m/hr GI Imaging: 7/16: CT abd pelvis without abscess or fluid collection but concern for small bowel obstruction. 7/18: AXR: Findings are compatible with interval worsening of reported adynamic ileus 7/19: AXR 8hr delay: Findings compatible with un underlying partial small bowel obstruction. Oral contrast has reached the ascending colon >> 24hr delay shows persistent small bowel dilation measuring up to 4.8 cm.  Oral contrast throughout the colon. 7/23 CT abd pelvis with high-grade partial small bowel obstruction with marked dilation of proximal small bowel loops.  GI Surgeries / Procedures:  7/10: surgery as per HPI 7/26: exploratory laparotomy with lysis of adhesions, small bowel resection, repair of small bowel serosa, repair of ascending  colon serosa   Central access: PICC 7/17 TPN start date: 7/17-7/22, resumed 7/24  Nutritional Goals:  Goal TPN rate is 90 mL/hr (provides 112 g of protein and 2199 kcals per day)  RD Assessment:  Estimated Needs Total Energy Estimated Needs: 2100-2300 kcal Total Protein Estimated Needs: 105-120 grams Total Fluid Estimated Needs: >/= 2.3 L/day  Current Nutrition:  NPO  TPN  Plan:  Now: Potassium phosphate 18 mmol IV once over 6 hours  At 1800: Continue TPN at 75 ml/hr Electrolytes in TPN:  Na 727m/L K 3048mL (increased) Ca 3mE40m  Mg 5mEq24mPhos 15mmo3m(increased) Cl:Ac 1:1 (acetate reduced) Add standard MVI and trace elements to TPN Continue Sensitive q8h SSI and adjust as needed  Monitor TPN labs on Mon/Thurs,  BMET with magnesium and phosphorus tomorrow morning,  BMET 7/30 am   Thank you for allowing pharmacy to be a part of this patient's care.  MelissRoyetta AsalmD, BCPS Clinical Pharmacist [place] Please utilize Amion for appropriate phone number to reach the unit pharmacist ([facility] [place]/2023 8:26 AM

## 2021-10-22 NOTE — TOC Initial Note (Signed)
Transition of Care [facility]) - Initial/Assessment Note    Patient Details  Name: Ivan Lopez MRN: [id number] Date of Birth: Aug 21, 1955  [facility] of [facility]) CM/SW Contact:    Ivan Phi, RN Phone Number: 10/22/2021, 3:14 PM  Clinical Narrative:Spoke to spouse about otpt PT-already active w/otpt PT for 6 months.    NGT in place-monitor.               Expected Discharge Plan: Home/Self Care Barriers to Discharge: Continued Medical Work up   Patient Goals and CMS Choice Patient states their goals for this hospitalization and ongoing recovery are:: Home CMS Medicare.gov Compare Post Acute Care list provided to:: Patient Represenative (must comment) (Ivan Lopez(spouse)) Choice offered to / list presented to : Spouse  Expected Discharge Plan and Services Expected Discharge Plan: Home/Self Care   Discharge Planning Services: CM Consult   Living arrangements for the past 2 months: Single Family Home                                      Prior Living Arrangements/Services Living arrangements for the past 2 months: Single Family Home Lives with:: Spouse Patient language and need for interpreter reviewed:: Yes Do you feel safe going back to the place where you live?: Yes      Need for Family Participation in Patient Care: Yes (Comment) Care giver support system in place?: Yes (comment)   Criminal Activity/Legal Involvement Pertinent to Current Situation/Hospitalization: No - Comment as needed  Activities of Daily Living Home Assistive Devices/Equipment: Blood pressure cuff, Eyeglasses, Grab bars in shower, Walker (specify type) (walker with seat and wheels) ADL Screening (condition at time of admission) Patient's cognitive ability adequate to safely complete daily activities?: Yes Is the patient deaf or have difficulty hearing?: No Does the patient have difficulty seeing, even when wearing glasses/contacts?: No Does the patient have difficulty concentrating,  remembering, or making decisions?: No Patient able to express need for assistance with ADLs?: Yes Does the patient have difficulty dressing or bathing?: No Independently performs ADLs?: Yes (appropriate for developmental age) Does the patient have difficulty walking or climbing stairs?: No Weakness of Legs: None Weakness of Arms/Hands: None  Permission Sought/Granted Permission sought to share information with : Case Manager Permission granted to share information with : Yes, Verbal Permission Granted  Share Information with NAME: Case Manager           Emotional Assessment Appearance:: Appears stated age Attitude/Demeanor/Rapport: Gracious Affect (typically observed): Accepting Orientation: : Oriented to Self, Oriented to Place, Oriented to  Time, Oriented to Situation Alcohol / Substance Use: Not Applicable Psych Involvement: No (comment)  Admission diagnosis:  Prostate cancer [facility]) [C61] Patient Active Problem List   Diagnosis Date Noted   Prostate cancer ([place]) 10/04/2021   PCP:  Ivan Dress, MD Pharmacy:   [id number] [id number]: [phone]: [address]Phone: [phone]: [phone]    Social Determinants of Health (SDOH) Interventions    Readmission Risk Interventions     No data to display

## 2021-10-22 NOTE — Progress Notes (Signed)
Patient ID: Ivan Lopez, male   DOB: 1955-04-18, 66 y.o.   MRN: [id number]  2 Days Post-Op Subjective: Pt with NG to LWIS.  S/P re-exploration by Dr. Dema Severin with lysis of adhesions, small bowel resection on Wednesday.  Complains of generalized abdominal "gas" pains.  Not passing flatus.  Objective: Vital signs in last 24 hours: Temp:  [98.1 F (36.7 C)-99.4 F (37.4 C)] 99.4 F (37.4 C) (07/28 1761) Pulse Rate:  [110-113] 110 (07/28 0633) Resp:  [13-20] 18 (07/28 6073) BP: (116-135)/(69-78) 116/69 (07/28 7106) SpO2:  [95 %-99 %] 97 % (07/28 2694)  Intake/Output from previous day: 07/27 0701 - 07/28 0700 In: 1195.3 [I.V.:975.3; IV Piggyback:220] Out: 0  Intake/Output this shift: No intake/output data recorded.  Physical Exam:  General: Alert and oriented Abdomen: Soft, ND GU: Urine clear in condom catheter Incisions: Dressing in place. Ext: NT, No erythema  Lab Results: Recent Labs    10/21/21 0337  HGB 11.5*  HCT 35.4*   BMET Recent Labs    10/21/21 0337 10/22/21 0244  NA 136 138  K 4.4 3.9  CL 106 109  CO2 23 22  GLUCOSE 152* 158*  BUN 22 25*  CREATININE 1.04 1.12  CALCIUM 7.8* 8.1*     Studies/Results: No results found.  Assessment/Plan: POD # 18 s/p robotic prostatectomy and POD # 2 s/p exploratory laparotomy, adhesiolysis, small bowel resection - Clinical management per General Surgery.  Will continue to monitor.   LOS: 17 days   [name] 10/22/2021, 7:05 AM

## 2021-10-22 NOTE — Progress Notes (Signed)
PT Cancellation Note  Patient Details Name: Ivan Lopez MRN: [id number] DOB: June 15, 1955   Cancelled Treatment:    Reason Eval/Treat Not Completed: Patient declined, no reason specified Pt politely declined, reports having a rough day today and not feeling well.  Requesting PT check back tomorrow.  Will check back as schedule permits.   Myrtis Hopping [place] 10/22/2021, 4:02 PM Arlyce Dice, DPT Physical Therapist Acute Rehabilitation Services Preferred contact method: Secure Chat Weekend Pager Only: [phone] Office: [phone]

## 2021-10-23 LAB — BASIC METABOLIC PANEL
Anion gap: 7 (ref 5–15)
BUN: 20 mg/dL (ref 8–23)
CO2: 22 mmol/L (ref 22–32)
Calcium: 7.6 mg/dL — ABNORMAL LOW (ref 8.9–10.3)
Chloride: 104 mmol/L (ref 98–111)
Creatinine, Ser: 0.9 mg/dL (ref 0.61–1.24)
GFR, Estimated: >60 mL/min (ref >60)
Glucose, Bld: 139 mg/dL — ABNORMAL HIGH (ref 70–99)
Potassium: 3.5 mmol/L (ref 3.5–5.1)
Sodium: 133 mmol/L — ABNORMAL LOW (ref 135–145)

## 2021-10-23 LAB — CBC
HCT: 27.9 % — ABNORMAL LOW (ref 39.0–52.0)
Hemoglobin: 9.1 g/dL — ABNORMAL LOW (ref 13.0–17.0)
MCH: 29.4 pg (ref 26.0–34.0)
MCHC: 32.6 g/dL (ref 30.0–36.0)
MCV: 90 fL (ref 80.0–100.0)
Platelets: 425 10*3/uL — ABNORMAL HIGH (ref 150–400)
RBC: 3.1 MIL/uL — ABNORMAL LOW (ref 4.22–5.81)
RDW: 14.9 % (ref 11.5–15.5)
WBC: 20.3 10*3/uL — ABNORMAL HIGH (ref 4.0–10.5)
nRBC: 0 % (ref 0.0–0.2)

## 2021-10-23 LAB — GLUCOSE, CAPILLARY
Glucose-Capillary: 132 mg/dL — ABNORMAL HIGH (ref 70–99)
Glucose-Capillary: 124 mg/dL — ABNORMAL HIGH (ref 70–99)
Glucose-Capillary: 140 mg/dL — ABNORMAL HIGH (ref 70–99)

## 2021-10-23 LAB — PHOSPHORUS: Phosphorus: 1.8 mg/dL — ABNORMAL LOW (ref 2.5–4.6)

## 2021-10-23 LAB — MAGNESIUM: Magnesium: 1.8 mg/dL (ref 1.7–2.4)

## 2021-10-23 MED ORDER — SODIUM PHOSPHATES 45 MMOLE/15ML IV SOLN
30.0000 mmol | Freq: Once | INTRAVENOUS | Status: AC
  Administered 2021-10-23: 30 mmol via INTRAVENOUS
  Filled 2021-10-23: qty 10

## 2021-10-23 MED ORDER — MAGNESIUM SULFATE IN D5W 1-5 GM/100ML-% IV SOLN
1.0000 g | Freq: Once | INTRAVENOUS | Status: AC
  Administered 2021-10-23: 1 g via INTRAVENOUS
  Filled 2021-10-23: qty 100

## 2021-10-23 MED ORDER — TRAVASOL 10 % IV SOLN
INTRAVENOUS | Status: AC
  Filled 2021-10-23: qty 936

## 2021-10-23 NOTE — Progress Notes (Signed)
Assessment & Plan: -POD#19 robotic assisted laparoscopic prostatectomy Dr. Alinda Money, and laparoscopic LOA with oversew of small bowel serosal injuries x2 Dr. Brantley Stage 7/10 -POD#3 exploratory laparotomy with LOA and small bowel resection Dr. Dema Severin 7/26 - await return in bowel function - continue NGT to LIWS and TPN - WBC 20K this AM - will repeat in AM 7/30 - mobilize as able - up to chair today - continue PCA for pain control and IV tylenol and IV robaxin   FEN: NPO, TPN, NGT to LIWS VTE: SQH ID: [email or web address] 7/26  Foley: out 7/27 and voiding        Armandina Gemma, MD [facility] [place] [facility] Office: [phone]        Chief Complaint: Small bowel resection after prostatectomy  Subjective: Patient in bed, family at bedside.  Some hiccups.  Objective: Vital signs in last 24 hours: Temp:  [99.1 F (37.3 C)-100.1 F (37.8 C)] 99.7 F (37.6 C) (07/29 0605) Pulse Rate:  [96-117] 101 (07/29 0605) Resp:  [20-24] 20 (07/29 0908) BP: (125-129)/(68-78) 129/68 (07/29 0605) SpO2:  [97 %-100 %] 99 % (07/29 0908) FiO2 (%):  [0 %] 0 % (07/29 0908) Last BM Date : 10/18/21  Intake/Output from previous day: 07/28 0701 - 07/29 0700 In: 2018.9 [I.V.:1578.9; IV Piggyback:440] Out: 1100 [Urine:950; Emesis/NG output:150] Intake/Output this shift: No intake/output data recorded.  Physical Exam: HEENT - sclerae clear, mucous membranes moist Neck - soft Abdomen - moderate distension; wound intact, dressing in place; quiet to auscultation; NG bilious Ext - no edema, non-tender Neuro - alert & oriented, no focal deficits  Lab Results:  Recent Labs    10/21/21 0337 10/23/21 0323  WBC 15.6* 20.3*  HGB 11.5* 9.1*  HCT 35.4* 27.9*  PLT 485* 425*   BMET Recent Labs    10/22/21 0244 10/23/21 0323  NA 138 133*  K 3.9 3.5  CL 109 104  CO2 22 22  GLUCOSE 158* 139*  BUN 25* 20  CREATININE 1.12 0.90  CALCIUM 8.1* 7.6*   PT/INR No results for input(s):  "LABPROT", "INR" in the last 72 hours. Comprehensive Metabolic Panel:    Component Value Date/Time   NA 133 (L) 10/23/2021 0323   NA 138 10/22/2021 0244   K 3.5 10/23/2021 0323   K 3.9 10/22/2021 0244   CL 104 10/23/2021 0323   CL 109 10/22/2021 0244   CO2 22 10/23/2021 0323   CO2 22 10/22/2021 0244   BUN 20 10/23/2021 0323   BUN 25 (H) 10/22/2021 0244   CREATININE 0.90 10/23/2021 0323   CREATININE 1.12 10/22/2021 0244   GLUCOSE 139 (H) 10/23/2021 0323   GLUCOSE 158 (H) 10/22/2021 0244   CALCIUM 7.6 (L) 10/23/2021 0323   CALCIUM 8.1 (L) 10/22/2021 0244   AST 20 10/21/2021 0337   AST 27 10/20/2021 0213   ALT 97 (H) 10/21/2021 0337   ALT 153 (H) 10/20/2021 0213   ALKPHOS 96 10/21/2021 0337   ALKPHOS 114 10/20/2021 0213   BILITOT 0.4 10/21/2021 0337   BILITOT 0.4 10/20/2021 0213   PROT 5.7 (L) 10/21/2021 0337   PROT 6.0 (L) 10/20/2021 0213   ALBUMIN 1.9 (L) 10/21/2021 0337   ALBUMIN 2.1 (L) 10/20/2021 0213    Studies/Results: DG CHEST PORT 1 VIEW  Result Date: 10/22/2021 CLINICAL DATA:  Nasogastric tube placement. EXAM: PORTABLE CHEST 1 VIEW COMPARISON:  July 20, 2016. FINDINGS: The heart size and mediastinal contours are within normal limits. Nasogastric tube tip is seen in proximal  stomach. Hypoinflation of the lungs is noted with mild bibasilar subsegmental atelectasis. Right-sided PICC line is noted with tip in expected position of the SVC. The visualized skeletal structures are unremarkable. IMPRESSION: Hypoinflation of the lungs with mild bibasilar subsegmental atelectasis. Nasogastric tube tip seen in proximal stomach. Electronically Signed   By: Marijo Conception M.D.   On: 10/22/2021 12:08   DG Abd Portable 1V  Result Date: 10/22/2021 CLINICAL DATA:  Ileus and NG tube placement EXAM: PORTABLE ABDOMEN - 1 VIEW COMPARISON:  KUB 10/15/2018 FINDINGS: The enteric catheter tip and side hole are in the stomach. There is gaseous distention of the stomach and loops of bowel in  the left hemiabdomen. There is no definite free intraperitoneal air. There is no acute osseous abnormality. IMPRESSION: 1. Enteric catheter tip and side hole in the stomach. 2. Gaseous distention of loops of bowel in the left hemiabdomen may reflect ileus or persistent obstruction. Electronically Signed   By: Valetta Mole M.D.   On: 10/22/2021 12:08      Armandina Gemma 10/23/2021   Patient ID: Gabrielle Dare, male   DOB: 09-14-1955, 66 y.o.   MRN: [id number]

## 2021-10-23 NOTE — Progress Notes (Signed)
Patient ID: Ivan Lopez, male   DOB: [birth year], 66 y.o.   MRN: [id number]  3 Days Post-Op Subjective: Pt with NG to LWIS.  S/P re-exploration by Dr. Dema Severin with lysis of adhesions, small bowel resection on Wednesday. Still no flatus Feels gurgling in stomach Did not walk yesterday  Objective: Vital signs in last 24 hours: Temp:  [99.1 F (37.3 C)-100.1 F (37.8 C)] 99.7 F (37.6 C) (07/29 0605) Pulse Rate:  [96-117] 101 (07/29 0605) Resp:  [20-24] 20 (07/29 0908) BP: (125-129)/(68-78) 129/68 (07/29 0605) SpO2:  [97 %-100 %] 99 % (07/29 0908) FiO2 (%):  [0 %] 0 % (07/29 0908)  Intake/Output from previous day: 07/28 0701 - 07/29 0700 In: 2018.9 [I.V.:1578.9; IV Piggyback:440] Out: 1100 [Urine:950; Emesis/NG output:150] Intake/Output this shift: No intake/output data recorded.  Physical Exam:  General: Alert and oriented Abdomen: Soft, tympanitic in epigastric/LUQ region  GU: Urine clear in condom catheter Incisions: Dressing in place. Ext: NT, No erythema  Lab Results: Recent Labs    10/21/21 0337 10/23/21 0323  HGB 11.5* 9.1*  HCT 35.4* 27.9*   BMET Recent Labs    10/22/21 0244 10/23/21 0323  NA 138 133*  K 3.9 3.5  CL 109 104  CO2 22 22  GLUCOSE 158* 139*  BUN 25* 20  CREATININE 1.12 0.90  CALCIUM 8.1* 7.6*     Studies/Results: DG CHEST PORT 1 VIEW  Result Date: 10/22/2021 CLINICAL DATA:  Nasogastric tube placement. EXAM: PORTABLE CHEST 1 VIEW COMPARISON:  July 20, 2016. FINDINGS: The heart size and mediastinal contours are within normal limits. Nasogastric tube tip is seen in proximal stomach. Hypoinflation of the lungs is noted with mild bibasilar subsegmental atelectasis. Right-sided PICC line is noted with tip in expected position of the SVC. The visualized skeletal structures are unremarkable. IMPRESSION: Hypoinflation of the lungs with mild bibasilar subsegmental atelectasis. Nasogastric tube tip seen in proximal stomach. Electronically Signed    By: Marijo Conception M.D.   On: 10/22/2021 12:08   DG Abd Portable 1V  Result Date: 10/22/2021 CLINICAL DATA:  Ileus and NG tube placement EXAM: PORTABLE ABDOMEN - 1 VIEW COMPARISON:  KUB 10/15/2018 FINDINGS: The enteric catheter tip and side hole are in the stomach. There is gaseous distention of the stomach and loops of bowel in the left hemiabdomen. There is no definite free intraperitoneal air. There is no acute osseous abnormality. IMPRESSION: 1. Enteric catheter tip and side hole in the stomach. 2. Gaseous distention of loops of bowel in the left hemiabdomen may reflect ileus or persistent obstruction. Electronically Signed   By: Valetta Mole M.D.   On: 10/22/2021 12:08    Assessment/Plan: POD # 19 s/p robotic prostatectomy and POD # 3 s/p exploratory laparotomy, adhesiolysis, small bowel resection - NG output low, still no evidence of return of bowel function, but gas on KUB yesterday in colon (?) - encouraged ambulation today - Care otherwise per GS   LOS: 18 days   Ardis Hughs 10/23/2021, 9:12 AM

## 2021-10-23 NOTE — Progress Notes (Signed)
Physical Therapy Treatment Patient Details Name: Ivan Lopez MRN: [id number] DOB: 1955-10-02 Today's Date: 10/23/2021   History of Present Illness Pt is a 66 year old male s/p robotic assisted laparoscopic prostatectomy Dr. Alinda Money, and laparoscopic LOA with oversew of small bowel serosal injuries x2 Dr. Brantley Stage on 10/04/21.  Pt is also s/p EXPLORATORY LAPAROTOMY, LYSIS OF ADHESSIONS, SMALL BOWEL RESECTION, REPAIR OF SMALL BOWEL on 10/20/21.    PT Comments    Pt ambulated 300' with RW today.  Wife present and supportive.  He is quiet throughout session.  Would recommend outpatient PT at [facility] at time of d/c to address his urinary incontinence from prostatectomy.   Recommendations for follow up therapy are one component of a multi-disciplinary discharge planning process, led by the attending physician.  Recommendations may be updated based on patient status, additional functional criteria and insurance authorization.  Follow Up Recommendations  Outpatient PT (recommend Pelvic PT at [facility])     Assistance Recommended at Discharge PRN  Patient can return home with the following A little help with walking and/or transfers;A little help with bathing/dressing/bathroom   Equipment Recommendations  None recommended by PT    Recommendations for Other Services       Precautions / Restrictions Precautions Precaution Comments: TPN, NGT, PCA Restrictions Weight Bearing Restrictions: No     Mobility  Bed Mobility               General bed mobility comments: pt in recliner    Transfers Overall transfer level: Needs assistance Equipment used: Rolling walker (2 wheels) Transfers: Sit to/from Stand Sit to Stand: Min guard           General transfer comment: wife assisting with lines.  Pt moves slowly and cautiously.    Ambulation/Gait Ambulation/Gait assistance: Min guard Gait Distance (Feet): 300 Feet Assistive device: Rolling walker (2 wheels) Gait Pattern/deviations:  Step-through pattern, Trunk flexed Gait velocity: decr     General Gait Details: decreased cadence   Stairs             Wheelchair Mobility    Modified Rankin (Stroke Patients Only)       Balance                                            Cognition Arousal/Alertness: Awake/alert Behavior During Therapy: WFL for tasks assessed/performed Overall Cognitive Status: Within Functional Limits for tasks assessed                                          Exercises      General Comments        Pertinent Vitals/Pain Pain Assessment Pain Assessment: 0-10 Pain Score: 8  Pain Location: abdomen Pain Descriptors / Indicators: Grimacing, Operative site guarding Pain Intervention(s): Monitored during session, PCA encouraged    Home Living                          Prior Function            PT Goals (current goals can now be found in the care plan section) Acute Rehab PT Goals PT Goal Formulation: With patient Time For Goal Achievement: 11/04/21 Potential to Achieve Goals: Good Progress towards PT goals: Progressing toward goals  Frequency    Min 3X/week      PT Plan Current plan remains appropriate    Co-evaluation              AM-PAC PT "6 Clicks" Mobility   Outcome Measure  Help needed turning from your back to your side while in a flat bed without using bedrails?: A Little Help needed moving from lying on your back to sitting on the side of a flat bed without using bedrails?: A Little Help needed moving to and from a bed to a chair (including a wheelchair)?: A Little Help needed standing up from a chair using your arms (e.g., wheelchair or bedside chair)?: A Little Help needed to walk in hospital room?: A Little Help needed climbing 3-5 steps with a railing? : A Lot 6 Click Score: 17    End of Session   Activity Tolerance: Patient tolerated treatment well Patient left: in chair;with call  bell/phone within reach;with family/visitor present;with nursing/sitter in room Nurse Communication: Mobility status PT Visit Diagnosis: Difficulty in walking, not elsewhere classified (R26.2);Muscle weakness (generalized) (M62.81)     Time: 2585-2778 PT Time Calculation (min) (ACUTE ONLY): 28 min  Charges:  $Gait Training: 8-22 mins $Therapeutic Activity: 8-22 mins                     Ivan Lopez, [place]  10/23/2021    Ivan Lopez 10/23/2021, 4:30 PM

## 2021-10-23 NOTE — Progress Notes (Signed)
PHARMACY - TOTAL PARENTERAL NUTRITION CONSULT NOTE   Indication: bowel obstruction  Patient Measurements: Height: 5' 11"  (180.3 cm) Weight: 68.4 kg (150 lb 11.2 oz) IBW/kg (Calculated) : 75.3 TPN AdjBW (KG): 68.4 Body mass index is 21.02 kg/m.   Assessment:  66 yo male with hx HTN, HLD, prostate cancer s/p LUTS. S/p robotic assisted laparoscopic radical prostatectomy, bilateral robotic assisted laparoscopic pelvic lymphadenectomy, LOA, oversew of small bowel serosal injuries on 10/04/21. Patient on TPN 10/11/21-10/16/21. CT on 10/17/21 with SBO with transition slightly above umbilicus. Pharmacy consulted 10/18/21 to resume TPN.  Glucose / Insulin: No hx DM.  A1c 5. - q8h CBGs 132-166 - 6 units SSI required in past 24 hours Electrolytes: Goal K at least 4, Mg at least 2.  K 3.5, phos 1.8, Mg 1.8 CO2 and Cl WNL Renal: Scr WNL Hepatic: AST improved to WNL. ALT continues to trend down. Alk Phos improved to WNL. Tbili WNL. Albumin low at 1.9.  Trig: WNL (last on 7/27) Intake / Output: NG to LIWS MIVF: NS at 11m/hr GI Imaging: 7/16: CT abd pelvis without abscess or fluid collection but concern for small bowel obstruction. 7/18: AXR: Findings are compatible with interval worsening of reported adynamic ileus 7/19: AXR 8hr delay: Findings compatible with un underlying partial small bowel obstruction. Oral contrast has reached the ascending colon >> 24hr delay shows persistent small bowel dilation measuring up to 4.8 cm.  Oral contrast throughout the colon. 7/23 CT abd pelvis with high-grade partial small bowel obstruction with marked dilation of proximal small bowel loops.  7/28 AXR: Gaseous distention of loops of bowel in the left hemiabdomen may reflect ileus or persistent obstruction GI Surgeries / Procedures:  7/10: surgery as per HPI 7/26: exploratory laparotomy with lysis of adhesions, small bowel resection, repair of small bowel serosa, repair of ascending colon serosa   Central  access: PICC 7/17 TPN start date: 7/17-7/22, resumed 7/24  Nutritional Goals:  Goal TPN rate is 90 mL/hr (provides 112 g of protein and 2199 kcals per day)  RD Assessment:  Estimated Needs Total Energy Estimated Needs: 2100-2300 kcal Total Protein Estimated Needs: 105-120 grams Total Fluid Estimated Needs: >/= 2.3 L/day  Current Nutrition:  NPO  TPN  Plan:  Now: Na phos 30 mmol IV, Mg 1 g IV  Continue TPN at 75 ml/hr - defer titrating to goal given persistent hypophosphatemia Electrolytes in TPN:  Na 70 mEq/L K 35 mEq/L Ca 326m/L  Mg 7 mEq/L Phos 18 mmol/L Cl:Ac 1:1 Add standard MVI and trace elements to TPN Continue Sensitive q8h SSI and adjust as needed  Monitor TPN labs on Mon/Thurs  BMET with magnesium and phosphorus tomorrow morning given ongoing electrolyte replacement/adjustment in TPN   Thank you for allowing pharmacy to be a part of this patient's care.  AbTawnya CrookPharmD, BCPS Clinical Pharmacist 10/23/2021 9:09 AM

## 2021-10-24 LAB — GLUCOSE, CAPILLARY
Glucose-Capillary: 140 mg/dL — ABNORMAL HIGH (ref 70–99)
Glucose-Capillary: 149 mg/dL — ABNORMAL HIGH (ref 70–99)
Glucose-Capillary: 155 mg/dL — ABNORMAL HIGH (ref 70–99)

## 2021-10-24 LAB — CBC
HCT: 27.6 % — ABNORMAL LOW (ref 39.0–52.0)
Hemoglobin: 8.8 g/dL — ABNORMAL LOW (ref 13.0–17.0)
MCH: 28.9 pg (ref 26.0–34.0)
MCHC: 31.9 g/dL (ref 30.0–36.0)
MCV: 90.5 fL (ref 80.0–100.0)
Platelets: 470 10*3/uL — ABNORMAL HIGH (ref 150–400)
RBC: 3.05 MIL/uL — ABNORMAL LOW (ref 4.22–5.81)
RDW: 14.8 % (ref 11.5–15.5)
WBC: 16 10*3/uL — ABNORMAL HIGH (ref 4.0–10.5)
nRBC: 0.2 % (ref 0.0–0.2)

## 2021-10-24 LAB — BASIC METABOLIC PANEL
Anion gap: 8 (ref 5–15)
BUN: 20 mg/dL (ref 8–23)
CO2: 20 mmol/L — ABNORMAL LOW (ref 22–32)
Calcium: 7.7 mg/dL — ABNORMAL LOW (ref 8.9–10.3)
Chloride: 108 mmol/L (ref 98–111)
Creatinine, Ser: 0.81 mg/dL (ref 0.61–1.24)
GFR, Estimated: >60 mL/min (ref >60)
Glucose, Bld: 137 mg/dL — ABNORMAL HIGH (ref 70–99)
Potassium: 3.4 mmol/L — ABNORMAL LOW (ref 3.5–5.1)
Sodium: 136 mmol/L (ref 135–145)

## 2021-10-24 LAB — MAGNESIUM: Magnesium: 2.1 mg/dL (ref 1.7–2.4)

## 2021-10-24 LAB — PHOSPHORUS: Phosphorus: 2.7 mg/dL (ref 2.5–4.6)

## 2021-10-24 MED ORDER — POTASSIUM CHLORIDE 10 MEQ/50ML IV SOLN
10.0000 meq | INTRAVENOUS | Status: AC
  Administered 2021-10-24 (×3): 10 meq via INTRAVENOUS
  Filled 2021-10-24 (×4): qty 50

## 2021-10-24 MED ORDER — TRAVASOL 10 % IV SOLN
INTRAVENOUS | Status: AC
  Filled 2021-10-24: qty 1123.2

## 2021-10-24 NOTE — Progress Notes (Signed)
Patient ID: Ivan Lopez, male   DOB: 12/11/1955, 66 y.o.   MRN: [id number]  4 Days Post-Op Subjective: Pt with NG to LWIS.  S/P re-exploration by Dr. Dema Severin with lysis of adhesions, small bowel resection on Wednesday. Still no flatus Walks yesterday, still feeling distended.  Objective: Vital signs in last 24 hours: Temp:  [98.5 F (36.9 C)-99.4 F (37.4 C)] 99.4 F (37.4 C) (07/30 0434) Pulse Rate:  [87-105] 87 (07/30 0434) Resp:  [17-25] 17 (07/30 0818) BP: (124-139)/(63-76) 124/69 (07/30 0434) SpO2:  [94 %-100 %] 94 % (07/30 0818) FiO2 (%):  [28 %] 28 % (07/30 0818)  Intake/Output from previous day: 07/29 0701 - 07/30 0700 In: 2733.4 [I.V.:2158.3; IV Piggyback:575.1] Out: 1100 [Urine:900; Emesis/NG output:200] Intake/Output this shift: No intake/output data recorded.  Physical Exam:  General: Alert and oriented Abdomen: Soft, tympanitic in epigastric/LUQ region  GU: Urine clear in condom catheter Incisions: Dressing in place. Ext: NT, No erythema  Lab Results: Recent Labs    10/23/21 0323 10/24/21 0355  HGB 9.1* 8.8*  HCT 27.9* 27.6*    BMET Recent Labs    10/23/21 0323 10/24/21 0355  NA 133* 136  K 3.5 3.4*  CL 104 108  CO2 22 20*  GLUCOSE 139* 137*  BUN 20 20  CREATININE 0.90 0.81  CALCIUM 7.6* 7.7*      Studies/Results: DG CHEST PORT 1 VIEW  Result Date: 10/22/2021 CLINICAL DATA:  Nasogastric tube placement. EXAM: PORTABLE CHEST 1 VIEW COMPARISON:  July 20, 2016. FINDINGS: The heart size and mediastinal contours are within normal limits. Nasogastric tube tip is seen in proximal stomach. Hypoinflation of the lungs is noted with mild bibasilar subsegmental atelectasis. Right-sided PICC line is noted with tip in expected position of the SVC. The visualized skeletal structures are unremarkable. IMPRESSION: Hypoinflation of the lungs with mild bibasilar subsegmental atelectasis. Nasogastric tube tip seen in proximal stomach. Electronically Signed   By:  Marijo Conception M.D.   On: 10/22/2021 12:08   DG Abd Portable 1V  Result Date: 10/22/2021 CLINICAL DATA:  Ileus and NG tube placement EXAM: PORTABLE ABDOMEN - 1 VIEW COMPARISON:  KUB 10/15/2018 FINDINGS: The enteric catheter tip and side hole are in the stomach. There is gaseous distention of the stomach and loops of bowel in the left hemiabdomen. There is no definite free intraperitoneal air. There is no acute osseous abnormality. IMPRESSION: 1. Enteric catheter tip and side hole in the stomach. 2. Gaseous distention of loops of bowel in the left hemiabdomen may reflect ileus or persistent obstruction. Electronically Signed   By: Valetta Mole M.D.   On: 10/22/2021 12:08    Assessment/Plan: POD # 20 s/p robotic prostatectomy and POD # 4 s/p exploratory laparotomy, adhesiolysis, small bowel resection -White blood cell count improved, afebrile, NG output less bilious, however has very faint, mostly absent bowel sounds.  - NG output low, still no evidence of return of bowel function. -Encouraged ambulation.  Care per general surgery.   LOS: 19 days   Ardis Hughs 10/24/2021, 9:57 AM

## 2021-10-24 NOTE — Progress Notes (Addendum)
PHARMACY - TOTAL PARENTERAL NUTRITION CONSULT NOTE   Indication: bowel obstruction  Patient Measurements: Height: 5' 11" (180.3 cm) Weight: 68.4 kg (150 lb 11.2 oz) IBW/kg (Calculated) : 75.3 TPN AdjBW (KG): 68.4 Body mass index is 21.02 kg/m.   Assessment:  66 yo male with hx HTN, HLD, prostate cancer s/p LUTS. S/p robotic assisted laparoscopic radical prostatectomy, bilateral robotic assisted laparoscopic pelvic lymphadenectomy, LOA, oversew of small bowel serosal injuries on 10/04/21. Patient on TPN 10/11/21-10/16/21. CT on 10/17/21 with SBO with transition slightly above umbilicus. Pharmacy consulted 10/18/21 to resume TPN.  Glucose / Insulin: No hx DM.  A1c 5. - q8h CBGs 124-140 - 4 units SSI required in past 24 hours Electrolytes: phos, Mg improved; K trending down Renal: Scr WNL Hepatic: AST improved to WNL. ALT continues to trend down. Alk Phos improved to WNL. Tbili WNL. Albumin low at 1.9.  Trig: WNL (last on 7/27) Intake / Output:  NG to LIWS - 24 hr output 150 ml UOP ~ 1 L in 24 hrs MIVF: NS at 30ml/hr GI Imaging: 7/16: CT abd pelvis without abscess or fluid collection but concern for small bowel obstruction. 7/18: AXR: Findings are compatible with interval worsening of reported adynamic ileus 7/19: AXR 8hr delay: Findings compatible with un underlying partial small bowel obstruction. Oral contrast has reached the ascending colon >> 24hr delay shows persistent small bowel dilation measuring up to 4.8 cm.  Oral contrast throughout the colon. 7/23 CT abd pelvis with high-grade partial small bowel obstruction with marked dilation of proximal small bowel loops.  7/28 AXR: Gaseous distention of loops of bowel in the left hemiabdomen may reflect ileus or persistent obstruction GI Surgeries / Procedures:  7/10: surgery as per HPI 7/26: exploratory laparotomy with lysis of adhesions, small bowel resection, repair of small bowel serosa, repair of ascending colon serosa   Central  access: PICC 7/17 TPN start date: 7/17-7/22, resumed 7/24  Nutritional Goals:  Goal TPN rate is 90 mL/hr (provides 112 g of protein and 2199 kcals per day)  RD Assessment:  Estimated Needs Total Energy Estimated Needs: 2100-2300 kcal Total Protein Estimated Needs: 105-120 grams Total Fluid Estimated Needs: >/= 2.3 L/day  Current Nutrition:  NPO - NGT remains to LIWS TPN  Plan:  Now: KCl 10 mEq IV x 3  At 1800: Increase TPN to goal 90 ml/hr given improvement in electrolytes  Electrolytes in TPN:  Na 70 mEq/L K 40 mEq/L - increased Ca 3 mEq/L  Mg 7 mEq/L Phos 18 mmol/L Cl:Ac 1:1 Discontinue NS at 30 ml/hr at 1800 once TPN advanced to goal Add standard MVI and trace elements to TPN Continue Sensitive q8h SSI and adjust as needed  Monitor TPN labs on Mon/Thurs    Thank you for allowing pharmacy to be a part of this patient's care.   K , PharmD, BCPS Clinical Pharmacist 10/24/2021 8:43 AM    

## 2021-10-24 NOTE — Progress Notes (Signed)
Assessment & Plan: -POD#20 - robotic assisted laparoscopic prostatectomy Dr. Alinda Money, and laparoscopic LOA with oversew of small bowel serosal injuries x2 Dr. Brantley Stage 7/10 -POD#4 - exploratory laparotomy with LOA and small bowel resection Dr. Dema Severin 7/26 - await resolution of post op ileus - continue NGT to LIWS and TPN - WBC 16K this AM - improving - mobilize as able - up in chair this AM - continue PCA for pain control and IV tylenol and IV robaxin   FEN: NPO, TPN, NGT to LIWS VTE: SQH ID: [email or web address] 7/26  Foley: out 7/27 and voiding        Armandina Gemma, MD [facility] [place] practice Office: [phone]        Chief Complaint: Prostate cancer  Subjective: Patient up in chair this AM, family at bedside eating.  Denies flatus or BM.  NG with bilious.  Objective: Vital signs in last 24 hours: Temp:  [98.5 F (36.9 C)-99.4 F (37.4 C)] 99.4 F (37.4 C) (07/30 0434) Pulse Rate:  [87-105] 87 (07/30 0434) Resp:  [17-25] 17 (07/30 0818) BP: (124-139)/(63-76) 124/69 (07/30 0434) SpO2:  [94 %-100 %] 94 % (07/30 0818) FiO2 (%):  [28 %] 28 % (07/30 0818) Last BM Date : 10/18/21  Intake/Output from previous day: 07/29 0701 - 07/30 0700 In: 2733.4 [I.V.:2158.3; IV Piggyback:575.1] Out: 1100 [Urine:900; Emesis/NG output:200] Intake/Output this shift: No intake/output data recorded.  Physical Exam: HEENT - sclerae clear, mucous membranes moist Neck - soft Abdomen - mild distension; quiet; minimal tenderness; dressing intact Ext - no edema, non-tender Neuro - alert & oriented, no focal deficits  Lab Results:  Recent Labs    10/23/21 0323 10/24/21 0355  WBC 20.3* 16.0*  HGB 9.1* 8.8*  HCT 27.9* 27.6*  PLT 425* 470*   BMET Recent Labs    10/23/21 0323 10/24/21 0355  NA 133* 136  K 3.5 3.4*  CL 104 108  CO2 22 20*  GLUCOSE 139* 137*  BUN 20 20  CREATININE 0.90 0.81  CALCIUM 7.6* 7.7*   PT/INR No results for input(s): "LABPROT", "INR"  in the last 72 hours. Comprehensive Metabolic Panel:    Component Value Date/Time   NA 136 10/24/2021 0355   NA 133 (L) 10/23/2021 0323   K 3.4 (L) 10/24/2021 0355   K 3.5 10/23/2021 0323   CL 108 10/24/2021 0355   CL 104 10/23/2021 0323   CO2 20 (L) 10/24/2021 0355   CO2 22 10/23/2021 0323   BUN 20 10/24/2021 0355   BUN 20 10/23/2021 0323   CREATININE 0.81 10/24/2021 0355   CREATININE 0.90 10/23/2021 0323   GLUCOSE 137 (H) 10/24/2021 0355   GLUCOSE 139 (H) 10/23/2021 0323   CALCIUM 7.7 (L) 10/24/2021 0355   CALCIUM 7.6 (L) 10/23/2021 0323   AST 20 10/21/2021 0337   AST 27 10/20/2021 0213   ALT 97 (H) 10/21/2021 0337   ALT 153 (H) 10/20/2021 0213   ALKPHOS 96 10/21/2021 0337   ALKPHOS 114 10/20/2021 0213   BILITOT 0.4 10/21/2021 0337   BILITOT 0.4 10/20/2021 0213   PROT 5.7 (L) 10/21/2021 0337   PROT 6.0 (L) 10/20/2021 0213   ALBUMIN 1.9 (L) 10/21/2021 0337   ALBUMIN 2.1 (L) 10/20/2021 0213    Studies/Results: DG CHEST PORT 1 VIEW  Result Date: 10/22/2021 CLINICAL DATA:  Nasogastric tube placement. EXAM: PORTABLE CHEST 1 VIEW COMPARISON:  July 20, 2016. FINDINGS: The heart size and mediastinal contours are within normal limits. Nasogastric tube tip is  seen in proximal stomach. Hypoinflation of the lungs is noted with mild bibasilar subsegmental atelectasis. Right-sided PICC line is noted with tip in expected position of the SVC. The visualized skeletal structures are unremarkable. IMPRESSION: Hypoinflation of the lungs with mild bibasilar subsegmental atelectasis. Nasogastric tube tip seen in proximal stomach. Electronically Signed   By: Marijo Conception M.D.   On: 10/22/2021 12:08   DG Abd Portable 1V  Result Date: 10/22/2021 CLINICAL DATA:  Ileus and NG tube placement EXAM: PORTABLE ABDOMEN - 1 VIEW COMPARISON:  KUB 10/15/2018 FINDINGS: The enteric catheter tip and side hole are in the stomach. There is gaseous distention of the stomach and loops of bowel in the left  hemiabdomen. There is no definite free intraperitoneal air. There is no acute osseous abnormality. IMPRESSION: 1. Enteric catheter tip and side hole in the stomach. 2. Gaseous distention of loops of bowel in the left hemiabdomen may reflect ileus or persistent obstruction. Electronically Signed   By: Valetta Mole M.D.   On: 10/22/2021 12:08      Armandina Gemma 10/24/2021   Patient ID: Ivan Lopez, male   DOB: 01-30-1956, 66 y.o.   MRN: [id number]

## 2021-10-24 NOTE — Progress Notes (Signed)
Nutrition Follow-up RD working remotely.  DOCUMENTATION CODES:   Not applicable  INTERVENTION:  - TPN regimen per Pharmacist. - diet re-advancement as medically feasible. - weigh patient today.   NUTRITION DIAGNOSIS:   Inadequate oral intake related to acute illness as evidenced by other (comment) (CLD and need for NGT to LIS). -ongoing though now NPO  GOAL:   Patient will meet greater than or equal to 90% of their needs -met with TPN regimen  MONITOR:   Diet advancement, Labs, Weight trends, I & O's, Other (Comment) (TPN regimen)   ASSESSMENT:   66 year-old male with medical history. On 7/10 he underwent robotic-assisted lap radical prostatectomy with pelvic lymphadenectomy. Medical course complicated by ileus with need for NGT placement for decompression and TPN initiation.  Significant Events: 7/10 admitted 7/13: NGT placed, set to suction 7/17: double lumen PICC placed in R brachial; TPN initiated 7/21: diet advanced to Soft  7/22: TPN stopped 7/23: NGT replaced, set to suction; diet downgraded to CLD 7/24: diet downgraded to NPO 7/25: TPN re-started 7/26: patient able to have 1/2 cup ice chips every 8 hours   Diet remains NPO with ice chips. Patient is receiving custom TPN @ 75 ml/hr to increase to goal rate of 90 ml/hr today at 1800. This regimen will provide 2199 kcal and 112 grams protein.   NGT remains in place to LIS since 7/23.  He has not been weighed since 7/23.   Labs reviewed; CBGs: 140 and 149 mg/dl, K: 3.4 mmol/l, Ca: 7.7 mg/dl.  Medications reviewed; sliding scale novolog, 10 mg IV reglan QID, 40 mg IV protonix/day, 10 mEq IV KCl x3 runs 7/30, 30 mmol IV NaPhos x1 run 7/29.  IVF; NS @ 30 ml/hr.    Diet Order:   Diet Order             Diet NPO time specified Except for: Ice Chips  Diet effective midnight                   EDUCATION NEEDS:   Education needs have been addressed  Skin:  Skin Assessment: Skin Integrity Issues: Skin  Integrity Issues:: Incisions Incisions: upper umbilicus (6/37) and abdomen x2 (7/26)  Last BM:  7/24 (type 5 x1, small amount)  Height:   Ht Readings from Last 1 Encounters:  10/20/21 5' 11"  (1.803 m)    Weight:   Wt Readings from Last 1 Encounters:  10/20/21 68.4 kg     BMI:  Body mass index is 21.02 kg/m.  Estimated Nutritional Needs:  Kcal:  2100-2300 kcal Protein:  105-120 grams Fluid:  >/= 2.3 L/day     Ivan Matin, MS, RD, LDN, CNSC Registered Dietitian II Inpatient Clinical Nutrition RD pager # and on-call/weekend pager # available in [facility]

## 2021-10-25 ENCOUNTER — Inpatient Hospital Stay (HOSPITAL_COMMUNITY): Payer: Medicare Other

## 2021-10-25 LAB — COMPREHENSIVE METABOLIC PANEL
ALT: 153 U/L — ABNORMAL HIGH (ref 0–44)
AST: 99 U/L — ABNORMAL HIGH (ref 15–41)
Albumin: <1.5 g/dL — ABNORMAL LOW (ref 3.5–5.0)
Alkaline Phosphatase: 237 U/L — ABNORMAL HIGH (ref 38–126)
Anion gap: 8 (ref 5–15)
BUN: 18 mg/dL (ref 8–23)
CO2: 19 mmol/L — ABNORMAL LOW (ref 22–32)
Calcium: 7.7 mg/dL — ABNORMAL LOW (ref 8.9–10.3)
Chloride: 109 mmol/L (ref 98–111)
Creatinine, Ser: 0.8 mg/dL (ref 0.61–1.24)
GFR, Estimated: >60 mL/min (ref >60)
Glucose, Bld: 136 mg/dL — ABNORMAL HIGH (ref 70–99)
Potassium: 3.4 mmol/L — ABNORMAL LOW (ref 3.5–5.1)
Sodium: 136 mmol/L (ref 135–145)
Total Bilirubin: 0.5 mg/dL (ref 0.3–1.2)
Total Protein: 5.5 g/dL — ABNORMAL LOW (ref 6.5–8.1)

## 2021-10-25 LAB — GLUCOSE, CAPILLARY
Glucose-Capillary: 154 mg/dL — ABNORMAL HIGH (ref 70–99)
Glucose-Capillary: 128 mg/dL — ABNORMAL HIGH (ref 70–99)
Glucose-Capillary: 137 mg/dL — ABNORMAL HIGH (ref 70–99)
Glucose-Capillary: 145 mg/dL — ABNORMAL HIGH (ref 70–99)

## 2021-10-25 LAB — PHOSPHORUS: Phosphorus: 2.4 mg/dL — ABNORMAL LOW (ref 2.5–4.6)

## 2021-10-25 LAB — MAGNESIUM: Magnesium: 2 mg/dL (ref 1.7–2.4)

## 2021-10-25 LAB — TRIGLYCERIDES: Triglycerides: 86 mg/dL (ref <150)

## 2021-10-25 MED ORDER — POTASSIUM PHOSPHATES 15 MMOLE/5ML IV SOLN
20.0000 mmol | Freq: Once | INTRAVENOUS | Status: AC
  Administered 2021-10-25: 20 mmol via INTRAVENOUS
  Filled 2021-10-25: qty 6.67

## 2021-10-25 MED ORDER — TRAVASOL 10 % IV SOLN
INTRAVENOUS | Status: AC
  Filled 2021-10-25: qty 1123.2

## 2021-10-25 NOTE — Progress Notes (Signed)
Physical Therapy Treatment Patient Details Name: Ivan Lopez MRN: [id number] DOB: 09/15/1955 Today's Date: 10/25/2021   History of Present Illness Pt is a 66 year old male s/p robotic assisted laparoscopic prostatectomy Dr. Alinda Money, and laparoscopic LOA with oversew of small bowel serosal injuries x2 Dr. Brantley Stage on 10/04/21.  Pt is also s/p EXPLORATORY LAPAROTOMY, LYSIS OF ADHESSIONS, SMALL BOWEL RESECTION, REPAIR OF SMALL BOWEL on 10/20/21.    PT Comments    POD # 21 Prostateectomy POD # 5 LAP post op Ileus (resolving) Pt in bed on NG tube, IV, PCA, Prema Fit Assisted OOB to amb went well.  General bed mobility comments: Min Assist for upper body and cation to multiple lines.General transfer comment: wife assisting with lines.  Pt moves slowly and cautiously. General Gait Details: tolerated a great distance of 350 feet with 25% VC's to stop and "slow down breathing" as RR increased to 37.  Spouse assisted as well.  Very supportive. Pt plans to return home with spouse.   Recommendations for follow up therapy are one component of a multi-disciplinary discharge planning process, led by the attending physician.  Recommendations may be updated based on patient status, additional functional criteria and insurance authorization.  Follow Up Recommendations  Outpatient PT     Assistance Recommended at Discharge PRN  Patient can return home with the following A little help with walking and/or transfers;A little help with bathing/dressing/bathroom   Equipment Recommendations  None recommended by PT    Recommendations for Other Services       Precautions / Restrictions Precautions Precautions: Fall Precaution Comments: TPN, NGT, PCA Restrictions Weight Bearing Restrictions: No     Mobility  Bed Mobility Overal bed mobility: Needs Assistance Bed Mobility: Supine to Sit     Supine to sit: Min assist     General bed mobility comments: Min Assist for upper body and cation to multiple  lines.    Transfers Overall transfer level: Needs assistance Equipment used: Rolling walker (2 wheels) Transfers: Sit to/from Stand Sit to Stand: Supervision, Min guard           General transfer comment: wife assisting with lines.  Pt moves slowly and cautiously.    Ambulation/Gait Ambulation/Gait assistance: Min guard, Supervision Gait Distance (Feet): 325 Feet Assistive device: Rolling walker (2 wheels) Gait Pattern/deviations: Step-through pattern, Trunk flexed       General Gait Details: tolerated a great distance of 350 feet with 25% VC's to stop and "slow down breathing" as RR increased to 37.  Spouse assisted as well.  Very supportive.   Stairs             Wheelchair Mobility    Modified Rankin (Stroke Patients Only)       Balance                                            Cognition Arousal/Alertness: Awake/alert Behavior During Therapy: WFL for tasks assessed/performed, Flat affect Overall Cognitive Status: Within Functional Limits for tasks assessed                                 General Comments: AxO x 3 very pleasant, quite but motivated.  Likes to walk.        Exercises      General Comments  Pertinent Vitals/Pain Pain Assessment Pain Assessment: Faces Faces Pain Scale: Hurts a little bit Pain Location: abdomen Pain Descriptors / Indicators: Grimacing, Operative site guarding Pain Intervention(s): Monitored during session, Repositioned    Home Living                          Prior Function            PT Goals (current goals can now be found in the care plan section) Progress towards PT goals: Progressing toward goals    Frequency    Min 3X/week      PT Plan Current plan remains appropriate    Co-evaluation              AM-PAC PT "6 Clicks" Mobility   Outcome Measure  Help needed turning from your back to your side while in a flat bed without using  bedrails?: A Little Help needed moving from lying on your back to sitting on the side of a flat bed without using bedrails?: A Little Help needed moving to and from a bed to a chair (including a wheelchair)?: A Little Help needed standing up from a chair using your arms (e.g., wheelchair or bedside chair)?: A Little Help needed to walk in hospital room?: A Little Help needed climbing 3-5 steps with a railing? : A Little 6 Click Score: 18    End of Session Equipment Utilized During Treatment: Gait belt Activity Tolerance: Patient tolerated treatment well Patient left: in chair;with call bell/phone within reach;with family/visitor present;with nursing/sitter in room Nurse Communication: Mobility status PT Visit Diagnosis: Difficulty in walking, not elsewhere classified (R26.2);Muscle weakness (generalized) (M62.81)     Time: 8003-4917 PT Time Calculation (min) (ACUTE ONLY): 25 min  Charges:  $Gait Training: 8-22 mins $Therapeutic Activity: 8-22 mins                     Rica Koyanagi  PTA [place] Office M-F          [phone] Weekend pager [phone]

## 2021-10-25 NOTE — Progress Notes (Signed)
Progress Note  5 Days Post-Op  Subjective: No flatus or BM. Some pain in chest/RUQ, less NGT output. Getting up to chair. Wife at bedside.   Objective: Vital signs in last 24 hours: Temp:  [98.8 F (37.1 C)-100 F (37.8 C)] 98.8 F (37.1 C) (07/31 0615) Pulse Rate:  [102-107] 102 (07/31 0615) Resp:  [17-22] 21 (07/31 0804) BP: (130-143)/(71-78) 130/78 (07/31 0615) SpO2:  [96 %-100 %] 96 % (07/31 0804) FiO2 (%):  [24 %-28 %] 24 % (07/30 1944) Weight:  [75.2 kg] 75.2 kg (07/30 1702) Last BM Date : 10/21/21  Intake/Output from previous day: 07/30 0701 - 07/31 0700 In: 627.1 [I.V.:627.1] Out: 1150 [Urine:1150] Intake/Output this shift: No intake/output data recorded.  PE: Gen:  Alert, NAD, pleasant Pulm:  Normal effort Abd: abdomen distended but soft, appropriately ttp, incisions C/D/I with staples present, surgical dressing removed today, NGT with thin brown drainage, BS hypoactive Skin: warm and dry, no rashes  Psych: A&Ox3    Lab Results:  Recent Labs    10/23/21 0323 10/24/21 0355  WBC 20.3* 16.0*  HGB 9.1* 8.8*  HCT 27.9* 27.6*  PLT 425* 470*   BMET Recent Labs    10/24/21 0355 10/25/21 0335  NA 136 136  K 3.4* 3.4*  CL 108 109  CO2 20* 19*  GLUCOSE 137* 136*  BUN 20 18  CREATININE 0.81 0.80  CALCIUM 7.7* 7.7*   PT/INR No results for input(s): "LABPROT", "INR" in the last 72 hours. CMP     Component Value Date/Time   NA 136 10/25/2021 0335   K 3.4 (L) 10/25/2021 0335   CL 109 10/25/2021 0335   CO2 19 (L) 10/25/2021 0335   GLUCOSE 136 (H) 10/25/2021 0335   BUN 18 10/25/2021 0335   CREATININE 0.80 10/25/2021 0335   CALCIUM 7.7 (L) 10/25/2021 0335   PROT 5.5 (L) 10/25/2021 0335   ALBUMIN <1.5 (L) 10/25/2021 0335   AST 99 (H) 10/25/2021 0335   ALT 153 (H) 10/25/2021 0335   ALKPHOS 237 (H) 10/25/2021 0335   BILITOT 0.5 10/25/2021 0335   GFRNONAA >60 10/25/2021 0335   Lipase  No results found for: "LIPASE"     Studies/Results: No  results found.  Anti-infectives: Anti-infectives (From admission, onward)    Start     Dose/Rate Route Frequency Ordered Stop   10/20/21 1000  cefoTEtan (CEFOTAN) 2 g in sodium chloride 0.9 % 100 mL IVPB        2 g 200 mL/hr over 30 Minutes Intravenous  Once 10/19/21 1309 10/20/21 1018   10/20/21 0946  sodium chloride 0.9 % with cefoTEtan (CEFOTAN) ADS Med       Note to Pharmacy: Karsten Ro: cabinet override      10/20/21 0946 10/20/21 0949   10/14/21 1430  sulfamethoxazole-trimethoprim (BACTRIM DS) 800-160 MG per tablet 1 tablet        1 tablet Oral Every 12 hours 10/14/21 1332 10/16/21 2152   10/04/21 2330  ceFAZolin (ANCEF) IVPB 1 g/50 mL premix        1 g 100 mL/hr over 30 Minutes Intravenous Every 8 hours 10/04/21 1737 10/05/21 1014   10/04/21 0922  ceFAZolin (ANCEF) IVPB 2g/100 mL premix        2 g 200 mL/hr over 30 Minutes Intravenous 30 min pre-op 10/04/21 0922 10/04/21 1530   10/04/21 0000  sulfamethoxazole-trimethoprim (BACTRIM DS) 800-160 MG tablet        1 tablet Oral 2 times daily 10/04/21 1048  Assessment/Plan POD#21 - robotic assisted laparoscopic prostatectomy Dr. Alinda Money, and laparoscopic LOA with oversew of small bowel serosal injuries x2 Dr. Brantley Stage 7/10 POD#5 - exploratory laparotomy with LOA and small bowel resection Dr. Dema Severin 7/26 - await resolution of post op ileus - continue NGT to LIWS and TPN - check KUB this AM to confirm position of NGT - WBC 16K yesterday, repeat this AM - if WBC going up may consider CT AP today, if WBC improving may wait 1-2 days and monitor - mobilize as able  - continue PCA for pain control and IV tylenol and IV robaxin   FEN: NPO, TPN, NGT to LIWS VTE: SQH ID: cefotetan 7/26  Foley: out 7/27 and voiding   LOS: 20 days    Norm Parcel, [facility] Surgery 10/25/2021, 9:18 AM Please see Amion for pager number during day hours 7:00am-4:30pm

## 2021-10-25 NOTE — Progress Notes (Signed)
Patient ID: Ivan Lopez, male   DOB: 1956/02/08, 66 y.o.   MRN: [id number]  5 Days Post-Op Subjective: Pt without changes over the weekend.  Still waiting on return of bowel function. Denies flatus.  NG in place.  Low grade temp and leukocytosis over the weekend but improved over last 24 hrs.  Objective: Vital signs in last 24 hours: Temp:  [98.8 F (37.1 C)-100 F (37.8 C)] 98.8 F (37.1 C) (07/31 0615) Pulse Rate:  [102-107] 102 (07/31 0615) Resp:  [17-22] 21 (07/31 0804) BP: (130-143)/(71-78) 130/78 (07/31 0615) SpO2:  [96 %-100 %] 96 % (07/31 0804) FiO2 (%):  [24 %-28 %] 24 % (07/30 1944) Weight:  [75.2 kg] 75.2 kg (07/30 1702)  Intake/Output from previous day: 07/30 0701 - 07/31 0700 In: 627.1 [I.V.:627.1] Out: 1150 [Urine:1150] Intake/Output this shift: No intake/output data recorded.  Physical Exam:  General: Alert and oriented Abdomen: Moderate distention, no bowel sounds Incisions: Dressing in place, clean and dry. Ext: NT, No erythema  Lab Results: Recent Labs    10/23/21 0323 10/24/21 0355  HGB 9.1* 8.8*  HCT 27.9* 27.6*      Latest Ref Rng & Units 10/24/2021    3:55 AM 10/23/2021    3:23 AM 10/21/2021    3:37 AM  CBC  WBC 4.0 - 10.5 K/uL 16.0  20.3  15.6   Hemoglobin 13.0 - 17.0 g/dL 8.8  9.1  11.5   Hematocrit 39.0 - 52.0 % 27.6  27.9  35.4   Platelets 150 - 400 K/uL 470  425  485      BMET Recent Labs    10/24/21 0355 10/25/21 0335  NA 136 136  K 3.4* 3.4*  CL 108 109  CO2 20* 19*  GLUCOSE 137* 136*  BUN 20 18  CREATININE 0.81 0.80  CALCIUM 7.7* 7.7*     Studies/Results: No results found.  Assessment/Plan: POD # 21 s/p robotic prostatectomy and POD # 5 s/p exploratory laparotomy, adhesiolysis, small bowel resection by General Surgery - Clinical management per General Surgery.  Will continue to monitor.  DVT prophylaxis.  Repeat CBC and KUB pending.     LOS: 20 days   [name] 10/25/2021, 9:32 AM

## 2021-10-25 NOTE — Progress Notes (Signed)
PHARMACY - TOTAL PARENTERAL NUTRITION CONSULT NOTE   Indication: bowel obstruction  Patient Measurements: Height: '5\' 11"'$  (180.3 cm) Weight: 75.2 kg (165 lb 12.6 oz) IBW/kg (Calculated) : 75.3 TPN AdjBW (KG): 68.4 Body mass index is 23.12 kg/m.   Assessment:  66 yo male with hx HTN, HLD, prostate cancer s/p LUTS. S/p robotic assisted laparoscopic radical prostatectomy, bilateral robotic assisted laparoscopic pelvic lymphadenectomy, LOA, oversew of small bowel serosal injuries on 10/04/21.  Patient on TPN 10/11/21-10/16/21. CT on 10/17/21 with SBO with transition slightly above umbilicus. Pharmacy consulted 10/18/21 to resume TPN for SBO. Post-op ileus.  Glucose / Insulin: No hx DM.  A1c 5. - q8h CBGs 140-154 - 6 units SSI required in past 24 hours Electrolytes: K 3.4 low, bicarb 19 down, CoCa 9.7, Phos 2.4 low,  Renal: Scr WNL Hepatic: Alkphos and AST/ALT up again. Tbili WNL. Albumin low at <1.5 Trig: 86 Intake / Output: LBM 7/20 NG to LIWS - 0 last 24h UOP ~ 1150 L in 24 hrs MIVF: d/c'd GI Meds: IV PPI/24h, IV Reglant '10mg'$ /6hr GI Imaging: 7/16: CT abd pelvis without abscess or fluid collection but concern for small bowel obstruction. 7/18: AXR: Findings are compatible with interval worsening of reported adynamic ileus 7/19: AXR 8hr delay: Findings compatible with un underlying partial small bowel obstruction. Oral contrast has reached the ascending colon >> 24hr delay shows persistent small bowel dilation measuring up to 4.8 cm.  Oral contrast throughout the colon. 7/23 CT abd pelvis with high-grade partial small bowel obstruction with marked dilation of proximal small bowel loops.  7/28 AXR: Gaseous distention of loops of bowel in the left hemiabdomen may reflect ileus or persistent obstruction  GI Surgeries / Procedures:  7/10: surgery as per HPI 7/26: exploratory laparotomy with lysis of adhesions, small bowel resection, repair of small bowel serosa, repair of ascending colon  serosa   Central access: PICC 7/17 TPN start date: 7/17-7/22, resumed 7/24  Nutritional Goals:  Goal TPN rate is 90 mL/hr (provides 112 g of protein and 2198 kcals per day)  RD Assessment:  Estimated Needs Total Energy Estimated Needs: 2100-2300 kcal Total Protein Estimated Needs: 105-120 grams Total Fluid Estimated Needs: >/= 2.3 L/day  Current Nutrition:  NPO - NGT remains to LIWS TPN  Plan:  Kphos 24mol IV x 1 (will also provide 29.590m K+)  At 1800: TPN at goal 90 ml/hr  (112g AA, 64.8g lipids (29.4%), 324g dextrose (GIR 2.99), 2198 kcal) meeting 100% of patients needs  Electrolytes in TPN:  Na 70 mEq/L K 50 mEq/L - increased (extra 202min next bag) Ca 3 mEq/L  Mg 7 mEq/L Phos 18 mmol/L Cl:Ac Max acetate Add standard MVI and trace elements to TPN Continue Sensitive q8h SSI and adjust as needed  Monitor TPN labs on Mon/Thurs and PRN   Kimmie Doren S. RobAlford HighlandharmD, BCPS Clinical Staff Pharmacist [email or web address] RobWayland SalinasharmD, BCPS Clinical Pharmacist 10/25/2021 7:52 AM

## 2021-10-26 ENCOUNTER — Inpatient Hospital Stay (HOSPITAL_COMMUNITY): Payer: Medicare Other

## 2021-10-26 LAB — PHOSPHORUS: Phosphorus: 2.9 mg/dL (ref 2.5–4.6)

## 2021-10-26 LAB — CBC
HCT: 28.4 % — ABNORMAL LOW (ref 39.0–52.0)
Hemoglobin: 9.3 g/dL — ABNORMAL LOW (ref 13.0–17.0)
MCH: 28.8 pg (ref 26.0–34.0)
MCHC: 32.7 g/dL (ref 30.0–36.0)
MCV: 87.9 fL (ref 80.0–100.0)
Platelets: 562 10*3/uL — ABNORMAL HIGH (ref 150–400)
RBC: 3.23 MIL/uL — ABNORMAL LOW (ref 4.22–5.81)
RDW: 15.1 % (ref 11.5–15.5)
WBC: 21.3 10*3/uL — ABNORMAL HIGH (ref 4.0–10.5)
nRBC: 0 % (ref 0.0–0.2)

## 2021-10-26 LAB — BASIC METABOLIC PANEL
Anion gap: 8 (ref 5–15)
BUN: 18 mg/dL (ref 8–23)
CO2: 21 mmol/L — ABNORMAL LOW (ref 22–32)
Calcium: 8.1 mg/dL — ABNORMAL LOW (ref 8.9–10.3)
Chloride: 108 mmol/L (ref 98–111)
Creatinine, Ser: 0.82 mg/dL (ref 0.61–1.24)
GFR, Estimated: >60 mL/min (ref >60)
Glucose, Bld: 146 mg/dL — ABNORMAL HIGH (ref 70–99)
Potassium: 3.7 mmol/L (ref 3.5–5.1)
Sodium: 137 mmol/L (ref 135–145)

## 2021-10-26 LAB — GLUCOSE, CAPILLARY
Glucose-Capillary: 156 mg/dL — ABNORMAL HIGH (ref 70–99)
Glucose-Capillary: 138 mg/dL — ABNORMAL HIGH (ref 70–99)
Glucose-Capillary: 157 mg/dL — ABNORMAL HIGH (ref 70–99)

## 2021-10-26 MED ORDER — IOHEXOL 300 MG/ML  SOLN
100.0000 mL | Freq: Once | INTRAMUSCULAR | Status: AC | PRN
  Administered 2021-10-26: 100 mL via INTRAVENOUS

## 2021-10-26 MED ORDER — IOHEXOL 9 MG/ML PO SOLN
500.0000 mL | ORAL | Status: AC
  Administered 2021-10-26: 500 mL via ORAL

## 2021-10-26 MED ORDER — HEPARIN SODIUM (PORCINE) 5000 UNIT/ML IJ SOLN
5000.0000 [IU] | Freq: Three times a day (TID) | INTRAMUSCULAR | Status: DC
  Administered 2021-10-27 – 2021-11-06 (×29): 5000 [IU] via SUBCUTANEOUS
  Filled 2021-10-26 (×29): qty 1

## 2021-10-26 MED ORDER — IOHEXOL 9 MG/ML PO SOLN
ORAL | Status: AC
  Administered 2021-10-26: 500 mL via ORAL
  Filled 2021-10-26: qty 1000

## 2021-10-26 MED ORDER — TRAVASOL 10 % IV SOLN
INTRAVENOUS | Status: AC
  Filled 2021-10-26: qty 1123.2

## 2021-10-26 MED ORDER — SODIUM CHLORIDE (PF) 0.9 % IJ SOLN
INTRAMUSCULAR | Status: AC
  Filled 2021-10-26: qty 50

## 2021-10-26 MED ORDER — ACETAMINOPHEN 10 MG/ML IV SOLN
1000.0000 mg | Freq: Four times a day (QID) | INTRAVENOUS | Status: AC
  Administered 2021-10-26 – 2021-10-27 (×4): 1000 mg via INTRAVENOUS
  Filled 2021-10-26 (×4): qty 100

## 2021-10-26 NOTE — Progress Notes (Signed)
Patient ID: Ivan Lopez, male   DOB: [birth year], 66 y.o.   MRN: [id number]  6 Days Post-Op Subjective: No acute events overnight. Still awaiting return of bowel function, NG output low, appears properly positioned on KUB yesterday. Still with some distention, leukocytosis to 20 this AM.  Objective: Vital signs in last 24 hours: Temp:  [98 F (36.7 C)-99.2 F (37.3 C)] 99.2 F (37.3 C) (08/01 0547) Pulse Rate:  [98-101] 101 (08/01 0547) Resp:  [20-26] 20 (08/01 0547) BP: (134-141)/(73-77) 140/76 (08/01 0547) SpO2:  [96 %-100 %] 100 % (08/01 0547)  Intake/Output from previous day: 07/31 0701 - 08/01 0700 In: 941.1 [I.V.:831.1; IV Piggyback:110] Out: 2000 [Urine:1600; Emesis/NG output:400] Intake/Output this shift: No intake/output data recorded.  Physical Exam:  General: Alert and oriented Abdomen: Moderate distention, no bowel sounds Incisions: Dressing in place, clean and dry. Ext: NT, No erythema  Lab Results: Recent Labs    10/24/21 0355 10/26/21 0454  HGB 8.8* 9.3*  HCT 27.6* 28.4*       Latest Ref Rng & Units 10/26/2021    4:54 AM 10/24/2021    3:55 AM 10/23/2021    3:23 AM  CBC  WBC 4.0 - 10.5 K/uL 21.3  16.0  20.3   Hemoglobin 13.0 - 17.0 g/dL 9.3  8.8  9.1   Hematocrit 39.0 - 52.0 % 28.4  27.6  27.9   Platelets 150 - 400 K/uL 562  470  425      BMET Recent Labs    10/25/21 0335 10/26/21 0454  NA 136 137  K 3.4* 3.7  CL 109 108  CO2 19* 21*  GLUCOSE 136* 146*  BUN 18 18  CREATININE 0.80 0.82  CALCIUM 7.7* 8.1*      Studies/Results: DG Abd Portable 1V  Result Date: 10/25/2021 CLINICAL DATA:  A 66 year old male presents for evaluation of postoperative ileus. EXAM: PORTABLE ABDOMEN - 1 VIEW COMPARISON:  October 22, 2021 FINDINGS: Mildly distended loops of bowel in the upper abdomen. Gas and stool in the RIGHT colon and transverse colon. The opacity of gas over the LEFT hemiabdomen with respect to colon and rectum. Gaseous distension of bowel loops  in the upper and LEFT abdomen is diminished. Gastric tube remains in place tip in the fundus, side port below GE junction. Basilar airspace disease and midline skin staples similar to prior imaging. No acute regional skeletal process. IMPRESSION: 1. Findings of improving ileus. No high-grade obstruction or free air. 2. Gastric tube remains in place. 3. Bibasilar airspace disease, attention on follow-up, potentially atelectasis. Electronically Signed   By: Zetta Bills M.D.   On: 10/25/2021 09:55    Assessment/Plan: POD # 21 s/p robotic prostatectomy and POD # 5 s/p exploratory laparotomy, adhesiolysis, small bowel resection by General Surgery - Clinical management per General Surgery.  Will continue to monitor.  DVT prophylaxis.  Anticipate repeat CT today     LOS: 21 days   Ivan Lopez 10/26/2021, 7:12 AM

## 2021-10-26 NOTE — Progress Notes (Signed)
Referring Physician(s): Stechschulte,P  Supervising Physician: Corrie Mckusick  Patient Status:  [facility] - In-pt  Chief Complaint:  Abdominal pain/fluid collection  Subjective: Pt known to IR service from image guided drainage of right lower quadrant retroperitoneal abscess and pelvic retroperitoneal abscess in 2005 post appendectomy.  He has a history of prostate cancer and is status post robotic assisted laparoscopic radical prostatectomy with pelvic lymph node excisions and laparoscopic lysis of adhesions with oversew of small bowel serosal injuries x2 on 10/04/2021.  He underwent exploratory laparotomy with LOA and small bowel resection on 10/20/2021.  He now presents with postop ileus and increasing WBC to 21.3 up from 16  2 days ago.  CT abdomen pelvis performed today revealed: 1. Signs of recent small bowel resection with anastomotic suture chain in the left lower quadrant of the abdomen. There is abnormal dilatation of the proximal and mid small bowel loops with transition to decreased caliber distal small bowel. Differential excludes recurrent small bowel obstruction versus postoperative ileus. 2. Large peripherally enhancing fluid collection within the ventral abdomen exhibiting mass effect and displacing the bowel loops posteriorly and laterally. Cannot exclude underlying abscess. 3. Smaller fluid collection is noted within the posterior pelvis measuring up to 5.3 cm. 4. Moderate pneumoperitoneum. 5. Small bilateral pleural effusions, left greater than right. 6. Cholelithiasis. 7. Aortic Atherosclerosis    Latest temp 99.6, hemoglobin 9.3, platelets 562k, PT/INR pending, creatinine normal; NG-tube in place.  Patient on TPN.  Request now received from surgery for image guided aspiration/drainage of ventral abdominal fluid collection.  Patient currently denies headache, worsening chest pain, pain, nausea, vomiting or bleeding.  He does have abdominal distention/discomfort,  constipation, occasional cough ,some mild dyspnea with exertion.  History reviewed. No pertinent past medical history. Past Surgical History:  Procedure Laterality Date   APPENDECTOMY     LAPAROSCOPIC LYSIS OF ADHESIONS N/A 10/04/2021   Procedure: LAPAROSCOPIC LYSIS OF ADHESIONS;  Surgeon: Erroll Luna, MD;  Location: [facility];  Service: General;  Laterality: N/A;   LAPAROTOMY N/A 10/20/2021   Procedure: EXPLORATORY LAPAROTOMY, LYSIS OF ADHESSIONS, SMALL BOWEL RESECTION, REPAIR OF SMALL BOWEL;  Surgeon: Ileana Roup, MD;  Location: [facility];  Service: General;  Laterality: N/A;   LYMPHADENECTOMY Bilateral 10/04/2021   Procedure: LYMPHADENECTOMY, PELVIC;  Surgeon: Erroll Luna, MD;  Location: [facility];  Service: General;  Laterality: Bilateral;   ROBOT ASSISTED LAPAROSCOPIC RADICAL PROSTATECTOMY N/A 10/04/2021   Procedure: XI ROBOTIC ASSISTED LAPAROSCOPIC RADICAL PROSTATECTOMY LEVEL 3;  Surgeon: Erroll Luna, MD;  Location: [facility];  Service: General;  Laterality: N/A;         Allergies: Patient has no known allergies.  Medications: Prior to Admission medications   Medication Sig Start Date End Date Taking? Authorizing Provider  atorvastatin (LIPITOR) 80 MG tablet Take 80 mg by mouth at bedtime. 08/19/21  Yes [provider]  docusate sodium (COLACE) 100 MG capsule Take 1 capsule (100 mg total) by mouth 2 (two) times daily. 10/04/21  Yes Dancy, Estill Bamberg, PA-C  finasteride (PROSCAR) 5 MG tablet Take 5 mg by mouth daily. 09/05/21  Yes [provider]  sulfamethoxazole-trimethoprim (BACTRIM DS) 800-160 MG tablet Take 1 tablet by mouth 2 (two) times daily. Start the day prior to foley removal appointment 10/04/21  Yes Dancy, Estill Bamberg, PA-C  tamsulosin (FLOMAX) 0.4 MG CAPS capsule Take 0.4 mg by mouth at bedtime. 08/19/21  Yes [provider]  traMADol (ULTRAM) 50 MG tablet Take 1-2 tablets (50-100 mg total) by mouth every 6 (six)  hours as needed for moderate pain or  severe pain. 10/04/21  Yes Dancy, Estill Bamberg, PA-C     Vital Signs: BP (!) 150/80 (BP Location: Left Arm)   Pulse (!) 105   Temp 99.6 F (37.6 C) (Oral)   Resp 19   Ht '5\' 11"'$  (1.803 m)   Wt 165 lb 12.6 oz (75.2 kg)   SpO2 99%   BMI 23.12 kg/m   Physical Exam awake, alert.  G-tube in place.  Chest with slightly diminished breath sounds bases.  Heart with tachycardic but regular rhythm.  Abdomen distended/taut, hypoactive bowel sounds, some mild generalized tenderness to palpation.  No significant lower extremity edema.  Imaging: CT ABDOMEN PELVIS W CONTRAST  Result Date: 10/26/2021 CLINICAL DATA:  Postop abdominal pain status post exploratory laparotomy, lysis of adhesions, and small-bowel resection. EXAM: CT ABDOMEN AND PELVIS WITH CONTRAST TECHNIQUE: Multidetector CT imaging of the abdomen and pelvis was performed using the standard protocol following bolus administration of intravenous contrast. RADIATION DOSE REDUCTION: This exam was performed according to the departmental dose-optimization program which includes automated exposure control, adjustment of the mA and/or kV according to patient size and/or use of iterative reconstruction technique. CONTRAST:  130m OMNIPAQUE IOHEXOL 300 MG/ML  SOLN COMPARISON:  10/17/2021 FINDINGS: Lower chest: There are small bilateral pleural effusions, left greater than right. Bilateral lower lobe atelectasis. Hepatobiliary: No suspicious liver lesion. Cyst within central right lobe of liver measures 2.7 cm, image 22/2. Tiny stones identified within the dependent portion of the gallbladder, image 31/2. Pancreas: Unremarkable. No pancreatic ductal dilatation or surrounding inflammatory changes. Spleen: Normal in size without focal abnormality. Adrenals/Urinary Tract: Normal adrenal glands. No kidney mass or hydronephrosis identified. Partially decompressed urinary bladder is grossly unremarkable. Stomach/Bowel: There is a nasogastric tube with tip terminating in the  gastric fundus. Signs of recent small bowel resection identified with anastomotic suture chain identified in the left lower quadrant of the abdomen, image 60/2. The proximal small bowel loops appear increased in caliber measuring up to 3.9 cm. Transition to decreased caliber small bowel noted within the left lower quadrant of the abdomen, image 37/7 and image 70/2. Distal small bowel loops appear decompressed. No pathologic dilatation of the colon. Vascular/Lymphatic: Aortic atherosclerosis without aneurysm. No signs of abdominopelvic adenopathy. Reproductive: Prostate gland is not visualized and may be surgically absent. Other: There is a large peripherally enhancing fluid collection within the ventral abdomen exhibiting mass effect and displacing the bowel loops posteriorly and laterally. This measures 17.4 x 8.5 by 23.1 cm (volume = 1800 cm^3), image 64/8 and image 55/2. Scattered locules of gas are identified within this fluid collection. Smaller fluid collection is noted within the posterior pelvis measuring 5.3 by 3.2 by 2.8 cm (volume = 25 cm^3), image 88/2. Moderate pneumoperitoneum is identified. Musculoskeletal: No acute or significant osseous findings. IMPRESSION: 1. Signs of recent small bowel resection with anastomotic suture chain in the left lower quadrant of the abdomen. There is abnormal dilatation of the proximal and mid small bowel loops with transition to decreased caliber distal small bowel. Differential excludes recurrent small bowel obstruction versus postoperative ileus. 2. Large peripherally enhancing fluid collection within the ventral abdomen exhibiting mass effect and displacing the bowel loops posteriorly and laterally. Cannot exclude underlying abscess. 3. Smaller fluid collection is noted within the posterior pelvis measuring up to 5.3 cm. 4. Moderate pneumoperitoneum. 5. Small bilateral pleural effusions, left greater than right. 6. Cholelithiasis. 7. Aortic Atherosclerosis  (ICD10-I70.0). Electronically Signed   By: TQueen SloughD.  On: 10/26/2021 14:27   DG Abd Portable 1V  Result Date: 10/25/2021 CLINICAL DATA:  A 66 year old male presents for evaluation of postoperative ileus. EXAM: PORTABLE ABDOMEN - 1 VIEW COMPARISON:  October 22, 2021 FINDINGS: Mildly distended loops of bowel in the upper abdomen. Gas and stool in the RIGHT colon and transverse colon. The opacity of gas over the LEFT hemiabdomen with respect to colon and rectum. Gaseous distension of bowel loops in the upper and LEFT abdomen is diminished. Gastric tube remains in place tip in the fundus, side port below GE junction. Basilar airspace disease and midline skin staples similar to prior imaging. No acute regional skeletal process. IMPRESSION: 1. Findings of improving ileus. No high-grade obstruction or free air. 2. Gastric tube remains in place. 3. Bibasilar airspace disease, attention on follow-up, potentially atelectasis. Electronically Signed   By: Zetta Bills M.D.   On: 10/25/2021 09:55    Labs:  CBC: Recent Labs    10/21/21 0337 10/23/21 0323 10/24/21 0355 10/26/21 0454  WBC 15.6* 20.3* 16.0* 21.3*  HGB 11.5* 9.1* 8.8* 9.3*  HCT 35.4* 27.9* 27.6* 28.4*  PLT 485* 425* 470* 562*    COAGS: No results for input(s): "INR", "APTT" in the last 8760 hours.  BMP: Recent Labs    10/23/21 0323 10/24/21 0355 10/25/21 0335 10/26/21 0454  NA 133* 136 136 137  K 3.5 3.4* 3.4* 3.7  CL 104 108 109 108  CO2 22 20* 19* 21*  GLUCOSE 139* 137* 136* 146*  BUN '20 20 18 18  '$ CALCIUM 7.6* 7.7* 7.7* 8.1*  CREATININE 0.90 0.81 0.80 0.82  GFRNONAA >60 >60 >60 >60    LIVER FUNCTION TESTS: Recent Labs    10/19/21 0307 10/20/21 0213 10/21/21 0337 10/25/21 0335  BILITOT 0.5 0.4 0.4 0.5  AST 50* 27 20 99*  ALT 233* 153* 97* 153*  ALKPHOS 142* 114 96 237*  PROT 7.3 6.0* 5.7* 5.5*  ALBUMIN 2.5* 2.1* 1.9* <1.5*    Assessment and Plan: Pt known to IR service from image guided drainage of  right lower quadrant retroperitoneal abscess and pelvic retroperitoneal abscess in 2005 post appendectomy.  He has a history of prostate cancer and is status post robotic assisted laparoscopic radical prostatectomy with pelvic lymph node excisions and laparoscopic lysis of adhesions with oversew of small bowel serosal injuries x2 on 10/04/2021.  He underwent exploratory laparotomy with LOA and small bowel resection on 10/20/2021.  He now presents with postop ileus and increasing WBC to 21.3 up from 16  2 days ago.  CT abdomen pelvis performed today revealed: 1. Signs of recent small bowel resection with anastomotic suture chain in the left lower quadrant of the abdomen. There is abnormal dilatation of the proximal and mid small bowel loops with transition to decreased caliber distal small bowel. Differential excludes recurrent small bowel obstruction versus postoperative ileus. 2. Large peripherally enhancing fluid collection within the ventral abdomen exhibiting mass effect and displacing the bowel loops posteriorly and laterally. Cannot exclude underlying abscess. 3. Smaller fluid collection is noted within the posterior pelvis measuring up to 5.3 cm. 4. Moderate pneumoperitoneum. 5. Small bilateral pleural effusions, left greater than right. 6. Cholelithiasis. 7. Aortic Atherosclerosis    Latest temp 99.6, hemoglobin 9.3, platelets 562k, PT/INR pending, creatinine normal; NG-tube in place.  Patient on TPN.  Request now received from surgery for image guided aspiration/drainage of ventral abdominal fluid collection.  Imaging studies have been reviewed by Dr.Shick. Risks and benefits discussed with the patient /spouse including  bleeding, infection, damage to adjacent structures, bowel perforation/fistula connection, and sepsis.  All of the patient's questions were answered, patient is agreeable to proceed. Consent signed and in chart. Procedure scheduled for 8/2.     Electronically Signed: D.  Rowe Robert, PA-C 10/26/2021, 3:48 PM   I spent a total of 25 Minutes at the the patient's bedside AND on the patient's hospital floor or unit, greater than 50% of which was counseling/coordinating care for CT-guided aspiration/drainage of abdominal fluid collection    Patient ID: Ivan Lopez, male   DOB: [birth year], 66 y.o.   MRN: [id number]

## 2021-10-26 NOTE — Progress Notes (Signed)
Progress Note  6 Days Post-Op  Subjective: No flatus or BM. WBC up this AM. Patient reports nausea with dilaudid. Ambulated yesterday. Wife at bedside this AM.   Objective: Vital signs in last 24 hours: Temp:  [98 F (36.7 C)-99.2 F (37.3 C)] 99.2 F (37.3 C) (08/01 0547) Pulse Rate:  [98-101] 101 (08/01 0547) Resp:  [20-26] 20 (08/01 0547) BP: (134-141)/(73-77) 140/76 (08/01 0547) SpO2:  [96 %-100 %] 100 % (08/01 0547) Last BM Date : 10/21/21  Intake/Output from previous day: 07/31 0701 - 08/01 0700 In: 941.1 [I.V.:831.1; IV Piggyback:110] Out: 2000 [Urine:1600; Emesis/NG output:400] Intake/Output this shift: No intake/output data recorded.  PE: Gen:  Alert, NAD, pleasant Pulm:  Normal effort Abd: abdomen distended but soft, appropriately ttp, incision C/D/I with staples present, NGT with thin bilious drainage, BS hypoactive Skin: warm and dry, no rashes  Psych: A&Ox3    Lab Results:  Recent Labs    10/24/21 0355 10/26/21 0454  WBC 16.0* 21.3*  HGB 8.8* 9.3*  HCT 27.6* 28.4*  PLT 470* 562*    BMET Recent Labs    10/25/21 0335 10/26/21 0454  NA 136 137  K 3.4* 3.7  CL 109 108  CO2 19* 21*  GLUCOSE 136* 146*  BUN 18 18  CREATININE 0.80 0.82  CALCIUM 7.7* 8.1*    PT/INR No results for input(s): "LABPROT", "INR" in the last 72 hours. CMP     Component Value Date/Time   NA 137 10/26/2021 0454   K 3.7 10/26/2021 0454   CL 108 10/26/2021 0454   CO2 21 (L) 10/26/2021 0454   GLUCOSE 146 (H) 10/26/2021 0454   BUN 18 10/26/2021 0454   CREATININE 0.82 10/26/2021 0454   CALCIUM 8.1 (L) 10/26/2021 0454   PROT 5.5 (L) 10/25/2021 0335   ALBUMIN <1.5 (L) 10/25/2021 0335   AST 99 (H) 10/25/2021 0335   ALT 153 (H) 10/25/2021 0335   ALKPHOS 237 (H) 10/25/2021 0335   BILITOT 0.5 10/25/2021 0335   GFRNONAA >60 10/26/2021 0454   Lipase  No results found for: "LIPASE"     Studies/Results: DG Abd Portable 1V  Result Date: 10/25/2021 CLINICAL DATA:   A 66 year old male presents for evaluation of postoperative ileus. EXAM: PORTABLE ABDOMEN - 1 VIEW COMPARISON:  October 22, 2021 FINDINGS: Mildly distended loops of bowel in the upper abdomen. Gas and stool in the RIGHT colon and transverse colon. The opacity of gas over the LEFT hemiabdomen with respect to colon and rectum. Gaseous distension of bowel loops in the upper and LEFT abdomen is diminished. Gastric tube remains in place tip in the fundus, side port below GE junction. Basilar airspace disease and midline skin staples similar to prior imaging. No acute regional skeletal process. IMPRESSION: 1. Findings of improving ileus. No high-grade obstruction or free air. 2. Gastric tube remains in place. 3. Bibasilar airspace disease, attention on follow-up, potentially atelectasis. Electronically Signed   By: Zetta Bills M.D.   On: 10/25/2021 09:55    Anti-infectives: Anti-infectives (From admission, onward)    Start     Dose/Rate Route Frequency Ordered Stop   10/20/21 1000  cefoTEtan (CEFOTAN) 2 g in sodium chloride 0.9 % 100 mL IVPB        2 g 200 mL/hr over 30 Minutes Intravenous  Once 10/19/21 1309 10/20/21 1018   10/20/21 0946  sodium chloride 0.9 % with cefoTEtan (CEFOTAN) ADS Med       Note to Pharmacy: Karsten Ro: cabinet override  10/20/21 0946 10/20/21 0949   10/14/21 1430  sulfamethoxazole-trimethoprim (BACTRIM DS) 800-160 MG per tablet 1 tablet        1 tablet Oral Every 12 hours 10/14/21 1332 10/16/21 2152   10/04/21 2330  ceFAZolin (ANCEF) IVPB 1 g/50 mL premix        1 g 100 mL/hr over 30 Minutes Intravenous Every 8 hours 10/04/21 1737 10/05/21 1014   10/04/21 0922  ceFAZolin (ANCEF) IVPB 2g/100 mL premix        2 g 200 mL/hr over 30 Minutes Intravenous 30 min pre-op 10/04/21 6256 10/04/21 1530   10/04/21 0000  sulfamethoxazole-trimethoprim (BACTRIM DS) 800-160 MG tablet        1 tablet Oral 2 times daily 10/04/21 1048          Assessment/Plan POD#22 - robotic  assisted laparoscopic prostatectomy Dr. Alinda Money, and laparoscopic LOA with oversew of small bowel serosal injuries x2 Dr. Brantley Stage 7/10 POD#6 - exploratory laparotomy with LOA and small bowel resection Dr. Dema Severin 7/26 - await resolution of post op ileus - continue NGT to LIWS and TPN  - WBC back up to 20K today  - CT AP with PO/IV contrast today  - mobilize as able  - continue PCA for pain control and IV tylenol and IV robaxin   FEN: NPO, TPN, NGT to LIWS VTE: SQH ID: [email or web address] 7/26  Foley: out 7/27 and voiding   LOS: 21 days    Norm Parcel, [facility] 10/26/2021, 8:37 AM Please see Amion for pager number during day hours 7:00am-4:30pm

## 2021-10-26 NOTE — Progress Notes (Signed)
PHARMACY - TOTAL PARENTERAL NUTRITION CONSULT NOTE   Indication: bowel obstruction  Patient Measurements: Height: '5\' 11"'$  (180.3 cm) Weight: 75.2 kg (165 lb 12.6 oz) IBW/kg (Calculated) : 75.3 TPN AdjBW (KG): 68.4 Body mass index is 23.12 kg/m.   Assessment:  66 yo male with hx HTN, HLD, prostate cancer s/p LUTS. S/p robotic assisted laparoscopic radical prostatectomy, bilateral robotic assisted laparoscopic pelvic lymphadenectomy, LOA, oversew of small bowel serosal injuries on 10/04/21.  Patient on TPN 10/11/21-10/16/21. CT on 10/17/21 with SBO with transition slightly above umbilicus. Pharmacy consulted 10/18/21 to resume TPN for SBO. Post-op ileus.  Glucose / Insulin: No hx DM.  A1c 5. - q8h CBGs 128-156 - 6 units SSI required in past 24 hours Electrolytes: K 3.7 improved, bicarb 19>21, CoCa 10.1, Phos 2.9 replaced Renal: Scr WNL Hepatic: Alkphos and AST/ALT up again. Tbili WNL. Albumin low at <1.5 Trig: 86 Intake / Output: LBM 7/27 NG to LIWS - 400 last 24h UOP ~ 1600 L in 24 hrs MIVF: d/c'd GI Meds: IV PPI/24h, IV Reglan '10mg'$ /6hr GI Imaging: 7/16: CT abd pelvis without abscess or fluid collection but concern for small bowel obstruction. 7/18: AXR: Findings are compatible with interval worsening of reported adynamic ileus 7/19: AXR 8hr delay: Findings compatible with un underlying partial small bowel obstruction. Oral contrast has reached the ascending colon >> 24hr delay shows persistent small bowel dilation measuring up to 4.8 cm.  Oral contrast throughout the colon. 7/23 CT abd pelvis with high-grade partial small bowel obstruction with marked dilation of proximal small bowel loops.  7/28 AXR: Gaseous distention of loops of bowel in the left hemiabdomen may reflect ileus or persistent obstruction 7/31: Abd Xray: improving ileus  GI Surgeries / Procedures:  7/10: surgery as per HPI 7/26: exploratory laparotomy with lysis of adhesions, small bowel resection, repair of small  bowel serosa, repair of ascending colon serosa   Central access: PICC 7/17 TPN start date: 7/17-7/22, resumed 7/24  Nutritional Goals:  Goal TPN rate is 90 mL/hr (provides 112 g of protein and 2198 kcals per day)  RD Assessment:  Estimated Needs Total Energy Estimated Needs: 2100-2300 kcal Total Protein Estimated Needs: 105-120 grams Total Fluid Estimated Needs: >/= 2.3 L/day  Current Nutrition:  NPO - NGT remains to LIWS TPN  Plan:  No change to TPN formula today. At 1800: TPN at goal 90 ml/hr  (112g AA, 64.8g lipids (29.4%), 324g dextrose (GIR 2.99), 2198 kcal) meeting 100% of patients needs  Electrolytes in TPN:  Na 70 mEq/L K 50 mEq/L  Ca 3 mEq/L  Mg 7 mEq/L Phos 18 mmol/L Cl:Ac Max acetate Add standard MVI and trace elements to TPN Continue Sensitive q8h SSI and adjust as needed  Monitor TPN labs on Mon/Thurs and PRN Possible CT today for leukocytosis   Rosario Duey S. Alford Highland, PharmD, BCPS Clinical Staff Pharmacist [email or web address] [place], PharmD, BCPS Clinical Pharmacist 10/26/2021 7:35 AM

## 2021-10-27 ENCOUNTER — Inpatient Hospital Stay (HOSPITAL_COMMUNITY): Payer: Medicare Other

## 2021-10-27 ENCOUNTER — Encounter (HOSPITAL_COMMUNITY): Payer: Self-pay | Admitting: Urology

## 2021-10-27 LAB — CREATININE, FLUID (PLEURAL, PERITONEAL, JP DRAINAGE): Creat, Fluid: 0.6 mg/dL

## 2021-10-27 LAB — CBC WITH DIFFERENTIAL/PLATELET
Abs Immature Granulocytes: 1.16 10*3/uL — ABNORMAL HIGH (ref 0.00–0.07)
Basophils Absolute: 0.1 10*3/uL (ref 0.0–0.1)
Basophils Relative: 1 %
Eosinophils Absolute: 0.2 10*3/uL (ref 0.0–0.5)
Eosinophils Relative: 1 %
HCT: 30.3 % — ABNORMAL LOW (ref 39.0–52.0)
Hemoglobin: 9.6 g/dL — ABNORMAL LOW (ref 13.0–17.0)
Immature Granulocytes: 6 %
Lymphocytes Relative: 7 %
Lymphs Abs: 1.4 10*3/uL (ref 0.7–4.0)
MCH: 30.7 pg (ref 26.0–34.0)
MCHC: 31.7 g/dL (ref 30.0–36.0)
MCV: 96.8 fL (ref 80.0–100.0)
Monocytes Absolute: 1 10*3/uL (ref 0.1–1.0)
Monocytes Relative: 6 %
Neutro Abs: 14.5 10*3/uL — ABNORMAL HIGH (ref 1.7–7.7)
Neutrophils Relative %: 79 %
Platelets: 592 10*3/uL — ABNORMAL HIGH (ref 150–400)
RBC: 3.13 MIL/uL — ABNORMAL LOW (ref 4.22–5.81)
RDW: 17 % — ABNORMAL HIGH (ref 11.5–15.5)
WBC: 18.4 10*3/uL — ABNORMAL HIGH (ref 4.0–10.5)
nRBC: 0.2 % (ref 0.0–0.2)

## 2021-10-27 LAB — PROTIME-INR
INR: 1.1 (ref 0.8–1.2)
Prothrombin Time: 14.6 seconds (ref 11.4–15.2)

## 2021-10-27 LAB — GLUCOSE, CAPILLARY
Glucose-Capillary: 165 mg/dL — ABNORMAL HIGH (ref 70–99)
Glucose-Capillary: 172 mg/dL — ABNORMAL HIGH (ref 70–99)
Glucose-Capillary: 164 mg/dL — ABNORMAL HIGH (ref 70–99)

## 2021-10-27 MED ORDER — FENTANYL CITRATE (PF) 100 MCG/2ML IJ SOLN
INTRAMUSCULAR | Status: AC
  Filled 2021-10-27: qty 2

## 2021-10-27 MED ORDER — TRAVASOL 10 % IV SOLN
INTRAVENOUS | Status: AC
  Filled 2021-10-27: qty 1123.2

## 2021-10-27 MED ORDER — LIDOCAINE HCL (PF) 1 % IJ SOLN
INTRAMUSCULAR | Status: AC | PRN
  Administered 2021-10-27: 20 mL

## 2021-10-27 MED ORDER — MIDAZOLAM HCL 2 MG/2ML IJ SOLN
INTRAMUSCULAR | Status: AC | PRN
  Administered 2021-10-27: 1 mg via INTRAVENOUS

## 2021-10-27 MED ORDER — SODIUM CHLORIDE 0.9% FLUSH
5.0000 mL | Freq: Three times a day (TID) | INTRAVENOUS | Status: DC
  Administered 2021-10-27 – 2021-11-06 (×31): 5 mL

## 2021-10-27 MED ORDER — FENTANYL CITRATE (PF) 100 MCG/2ML IJ SOLN
INTRAMUSCULAR | Status: AC | PRN
  Administered 2021-10-27: 50 ug via INTRAVENOUS

## 2021-10-27 MED ORDER — ACETAMINOPHEN 10 MG/ML IV SOLN
1000.0000 mg | Freq: Four times a day (QID) | INTRAVENOUS | Status: AC
  Administered 2021-10-27 – 2021-10-28 (×4): 1000 mg via INTRAVENOUS
  Filled 2021-10-27 (×4): qty 100

## 2021-10-27 MED ORDER — MIDAZOLAM HCL 2 MG/2ML IJ SOLN
INTRAMUSCULAR | Status: AC
  Filled 2021-10-27: qty 4

## 2021-10-27 NOTE — Progress Notes (Signed)
PT Cancellation Note  Patient Details Name: Ivan Lopez MRN: [id number] DOB: April 26, 1955   Cancelled Treatment:    Reason Eval/Treat Not Completed: Medical issues which prohibited therapy Pt in pain. Also, plan for "8/2: image guided aspiration/drainage of ventral abdominal fluid collection"   [place] 10/27/2021, 9:38 AM Jannette Spanner PT, DPT Physical Therapist [facility] Preferred contact method: [place] Weekend Pager Only: [phone] Office: [phone]

## 2021-10-27 NOTE — Progress Notes (Signed)
PHARMACY - TOTAL PARENTERAL NUTRITION CONSULT NOTE   Indication: bowel obstruction  Patient Measurements: Height: '5\' 11"'$  (180.3 cm) Weight: 75.2 kg (165 lb 12.6 oz) IBW/kg (Calculated) : 75.3 TPN AdjBW (KG): 68.4 Body mass index is 23.12 kg/m.   Assessment:  66 yo male with hx HTN, HLD, prostate cancer s/p LUTS. S/p robotic assisted laparoscopic radical prostatectomy, bilateral robotic assisted laparoscopic pelvic lymphadenectomy, LOA, oversew of small bowel serosal injuries on 10/04/21.  Patient on TPN 10/11/21-10/16/21. CT on 10/17/21 with SBO with transition slightly above umbilicus. Pharmacy consulted 10/18/21 to resume TPN for SBO. Post-op ileus.  Glucose / Insulin: No hx DM.  A1c 5. - q8h CBGs 138-165 - 7 units SSI required in past 24 hours Electrolytes: K 3.7 improved, bicarb 19>21, CoCa 10.1, Phos 2.9 replaced Renal: Scr WNL Hepatic: Alkphos and AST/ALT up again. Tbili WNL. Albumin low at <1.5 Trig: 86 Intake / Output: LBM 7/27 NG to LIWS - 0 documented last 24h UOP ~ 1100 L in 24 hrs MIVF: d/c'd GI Meds: IV PPI/24h, IV Reglan '10mg'$ /6hr GI Imaging: 7/16: CT abd pelvis without abscess or fluid collection but concern for small bowel obstruction. 7/18: AXR: Findings are compatible with interval worsening of reported adynamic ileus 7/19: AXR 8hr delay: Findings compatible with un underlying partial small bowel obstruction. Oral contrast has reached the ascending colon >> 24hr delay shows persistent small bowel dilation measuring up to 4.8 cm.  Oral contrast throughout the colon. 7/23 CT abd pelvis with high-grade partial small bowel obstruction with marked dilation of proximal small bowel loops.  7/28 AXR: Gaseous distention of loops of bowel in the left hemiabdomen may reflect ileus or persistent obstruction 7/31: Abd Xray: improving ileus 8/1 CT: recurrent small bowel obstruction versus postoperative ileus. 2. Large peripherally enhancing fluid collection..Cannot exclude  underlying abscess.Smaller fluid collection is noted within the posterior pelvis  GI Surgeries / Procedures:  7/10: surgery as per HPI 7/26: exploratory laparotomy with lysis of adhesions, small bowel resection, repair of small bowel serosa, repair of ascending colon serosa  8/2: image guided aspiration/drainage of ventral abdominal fluid collection  Central access: PICC 7/17 TPN start date: 7/17-7/22, resumed 7/24  Nutritional Goals:  Goal TPN rate is 90 mL/hr (provides 112 g of protein and 2198 kcals per day)  RD Assessment:  Estimated Needs Total Energy Estimated Needs: 2100-2300 kcal Total Protein Estimated Needs: 105-120 grams Total Fluid Estimated Needs: >/= 2.3 L/day  Current Nutrition:  NPO - NGT remains to LIWS TPN  Plan:   No change to TPN formula today. At 1800: TPN at goal 90 ml/hr  (112g AA, 64.8g lipids (29.4%), 324g dextrose (GIR 2.99), 2198 kcal) meeting 100% of patients needs  Electrolytes in TPN:  Na 70 mEq/L K 50 mEq/L  Ca 3 mEq/L  Mg 7 mEq/L Phos 18 mmol/L Cl:Ac Max acetate Add standard MVI and trace elements to TPN Continue Sensitive q8h SSI and adjust as needed  Monitor TPN labs on Mon/Thurs and PRN 8/2: Image guided aspiration/drainage of ventral abdominal fluid collection    Zacharius Funari S. Alford Highland, PharmD, BCPS Clinical Staff Pharmacist [email or web address] Way[place], PharmD, BCPS Clinical Pharmacist 10/27/2021 8:05 AM

## 2021-10-27 NOTE — TOC Progression Note (Signed)
Transition of Care [facility]) - Progression Note    Patient Details  Name: Ivan Lopez MRN: [id number] Date of Birth: [birth year]  Transition of Care [facility]) CM/SW Contact  Shemaiah Round, [name], RN Phone Number: 10/27/2021, 12:35 PM  Clinical Narrative: On TPN, has N[name] [employer] following for possible home TPN.      Expected Discharge Plan: Home/Self Care Barriers to Discharge: Continued Medical Work up  Expected Discharge Plan and Services Expected Discharge Plan: Home/Self Care   Discharge Planning Services: CM Consult   Living arrangements for the past 2 months: Single Family Home                                       Social Determinants of Health (SDOH) Interventions    Readmission Risk Interventions     No data to display

## 2021-10-27 NOTE — Progress Notes (Signed)
Progress Note  7 Days Post-Op  Subjective: No flatus or BM. Plans for IR drain placement today. Discussed with patient and wife at bedside this AM.   Objective: Vital signs in last 24 hours: Temp:  [98.6 F (37 C)-99.6 F (37.6 C)] 98.6 F (37 C) (08/02 0831) Pulse Rate:  [97-108] 105 (08/02 0831) Resp:  [18-30] 24 (08/02 0831) BP: (129-150)/(76-83) 134/82 (08/02 0831) SpO2:  [99 %-100 %] 99 % (08/02 0831) FiO2 (%):  [22 %-24 %] 22 % (08/01 2000) Last BM Date : 10/21/21  Intake/Output from previous day: 08/01 0701 - 08/02 0700 In: 2051.6 [I.V.:1128.2; NG/GT:750; IV Piggyback:173.4] Out: 1300 [Urine:1100; Emesis/NG output:200] Intake/Output this shift: Total I/O In: -  Out: 600 [Emesis/NG output:600]  PE: Gen:  Alert, NAD, pleasant Pulm:  Normal effort Abd: abdomen distended but soft, appropriately ttp, incision C/D/I with staples present, NGT with thin bilious drainage, BS hypoactive Skin: warm and dry, no rashes  Psych: A&Ox3    Lab Results:  Recent Labs    10/26/21 0454 10/27/21 0417  WBC 21.3* 18.4*  HGB 9.3* 9.6*  HCT 28.4* 30.3*  PLT 562* 592*    BMET Recent Labs    10/25/21 0335 10/26/21 0454  NA 136 137  K 3.4* 3.7  CL 109 108  CO2 19* 21*  GLUCOSE 136* 146*  BUN 18 18  CREATININE 0.80 0.82  CALCIUM 7.7* 8.1*    PT/INR Recent Labs    10/27/21 0417  LABPROT 14.6  INR 1.1   CMP     Component Value Date/Time   NA 137 10/26/2021 0454   K 3.7 10/26/2021 0454   CL 108 10/26/2021 0454   CO2 21 (L) 10/26/2021 0454   GLUCOSE 146 (H) 10/26/2021 0454   BUN 18 10/26/2021 0454   CREATININE 0.82 10/26/2021 0454   CALCIUM 8.1 (L) 10/26/2021 0454   PROT 5.5 (L) 10/25/2021 0335   ALBUMIN <1.5 (L) 10/25/2021 0335   AST 99 (H) 10/25/2021 0335   ALT 153 (H) 10/25/2021 0335   ALKPHOS 237 (H) 10/25/2021 0335   BILITOT 0.5 10/25/2021 0335   GFRNONAA >60 10/26/2021 0454   Lipase  No results found for: "LIPASE"     Studies/Results: CT  ABDOMEN PELVIS W CONTRAST  Result Date: 10/26/2021 CLINICAL DATA:  Postop abdominal pain status post exploratory laparotomy, lysis of adhesions, and small-bowel resection. EXAM: CT ABDOMEN AND PELVIS WITH CONTRAST TECHNIQUE: Multidetector CT imaging of the abdomen and pelvis was performed using the standard protocol following bolus administration of intravenous contrast. RADIATION DOSE REDUCTION: This exam was performed according to the departmental dose-optimization program which includes automated exposure control, adjustment of the mA and/or kV according to patient size and/or use of iterative reconstruction technique. CONTRAST:  123m OMNIPAQUE IOHEXOL 300 MG/ML  SOLN COMPARISON:  10/17/2021 FINDINGS: Lower chest: There are small bilateral pleural effusions, left greater than right. Bilateral lower lobe atelectasis. Hepatobiliary: No suspicious liver lesion. Cyst within central right lobe of liver measures 2.7 cm, image 22/2. Tiny stones identified within the dependent portion of the gallbladder, image 31/2. Pancreas: Unremarkable. No pancreatic ductal dilatation or surrounding inflammatory changes. Spleen: Normal in size without focal abnormality. Adrenals/Urinary Tract: Normal adrenal glands. No kidney mass or hydronephrosis identified. Partially decompressed urinary bladder is grossly unremarkable. Stomach/Bowel: There is a nasogastric tube with tip terminating in the gastric fundus. Signs of recent small bowel resection identified with anastomotic suture chain identified in the left lower quadrant of the abdomen, image 60/2. The proximal small  bowel loops appear increased in caliber measuring up to 3.9 cm. Transition to decreased caliber small bowel noted within the left lower quadrant of the abdomen, image 37/7 and image 70/2. Distal small bowel loops appear decompressed. No pathologic dilatation of the colon. Vascular/Lymphatic: Aortic atherosclerosis without aneurysm. No signs of abdominopelvic  adenopathy. Reproductive: Prostate gland is not visualized and may be surgically absent. Other: There is a large peripherally enhancing fluid collection within the ventral abdomen exhibiting mass effect and displacing the bowel loops posteriorly and laterally. This measures 17.4 x 8.5 by 23.1 cm (volume = 1800 cm^3), image 64/8 and image 55/2. Scattered locules of gas are identified within this fluid collection. Smaller fluid collection is noted within the posterior pelvis measuring 5.3 by 3.2 by 2.8 cm (volume = 25 cm^3), image 88/2. Moderate pneumoperitoneum is identified. Musculoskeletal: No acute or significant osseous findings. IMPRESSION: 1. Signs of recent small bowel resection with anastomotic suture chain in the left lower quadrant of the abdomen. There is abnormal dilatation of the proximal and mid small bowel loops with transition to decreased caliber distal small bowel. Differential excludes recurrent small bowel obstruction versus postoperative ileus. 2. Large peripherally enhancing fluid collection within the ventral abdomen exhibiting mass effect and displacing the bowel loops posteriorly and laterally. Cannot exclude underlying abscess. 3. Smaller fluid collection is noted within the posterior pelvis measuring up to 5.3 cm. 4. Moderate pneumoperitoneum. 5. Small bilateral pleural effusions, left greater than right. 6. Cholelithiasis. 7. Aortic Atherosclerosis (ICD10-I70.0). Electronically Signed   By: Kerby Moors M.D.   On: 10/26/2021 14:27    Anti-infectives: Anti-infectives (From admission, onward)    Start     Dose/Rate Route Frequency Ordered Stop   10/20/21 1000  cefoTEtan (CEFOTAN) 2 g in sodium chloride 0.9 % 100 mL IVPB        2 g 200 mL/hr over 30 Minutes Intravenous  Once 10/19/21 1309 10/20/21 1018   10/20/21 0946  sodium chloride 0.9 % with cefoTEtan (CEFOTAN) ADS Med       Note to Pharmacy: Karsten Ro: cabinet override      10/20/21 0946 10/20/21 0949   10/14/21 1430   sulfamethoxazole-trimethoprim (BACTRIM DS) 800-160 MG per tablet 1 tablet        1 tablet Oral Every 12 hours 10/14/21 1332 10/16/21 2152   10/04/21 2330  ceFAZolin (ANCEF) IVPB 1 g/50 mL premix        1 g 100 mL/hr over 30 Minutes Intravenous Every 8 hours 10/04/21 1737 10/05/21 1014   10/04/21 0922  ceFAZolin (ANCEF) IVPB 2g/100 mL premix        2 g 200 mL/hr over 30 Minutes Intravenous 30 min pre-op 10/04/21 0922 10/04/21 1530   10/04/21 0000  sulfamethoxazole-trimethoprim (BACTRIM DS) 800-160 MG tablet        1 tablet Oral 2 times daily 10/04/21 1048          Assessment/Plan POD#23 - robotic assisted laparoscopic prostatectomy Dr. Alinda Money, and laparoscopic LOA with oversew of small bowel serosal injuries x2 Dr. Brantley Stage 7/10 POD#7 - exploratory laparotomy with LOA and small bowel resection Dr. Dema Severin 7/26 - await resolution of post op ileus - continue NGT to LIWS and TPN  - CT yesterday with large intra-abdominal fluid collection - unclear whether this is abscess vs anastomotic leak, IR consulted and planning drainage today. We will see characteristics of drainage after placement   - mobilize as able  - continue PCA for pain control and IV tylenol and IV robaxin  FEN: NPO, TPN, NGT to LIWS VTE: SQH ID: [email or web address] 7/26  Foley: out 7/27 and voiding   LOS: 22 days    Norm Parcel, [facility] 10/27/2021, 10:23 AM Please see Amion for pager number during day hours 7:00am-4:30pm

## 2021-10-27 NOTE — Procedures (Signed)
Interventional Radiology Procedure Note  Procedure: Image guided drain placement, anterior abdominal fluid (extraperitoneal/space of reztius?).  17F pigtail drain.  ~520cc of murky thin fluid aspirated  Complications: None  EBL: None Sample: Culture sent  Recommendations: - Routine drain care, with sterile flushes, record output - follow up Cx - routine wound care  Signed,  Dulcy Fanny. Earleen Newport, DO

## 2021-10-27 NOTE — Progress Notes (Addendum)
Patient ID: Ivan Lopez, male   DOB: 1955-04-19, 66 y.o.   MRN: [id number]  7 Days Post-Op Subjective: Pt s/p percutaneous drainage of large fluid collection noted on CT yesterday.  Feeling better.  NG in place.  Objective: Vital signs in last 24 hours: Temp:  [98.6 F (37 C)-99.7 F (37.6 C)] 99.7 F (37.6 C) (08/02 1135) Pulse Rate:  [97-115] 114 (08/02 1135) Resp:  [18-26] 21 (08/02 1220) BP: (129-147)/(76-96) 138/85 (08/02 1135) SpO2:  [97 %-100 %] 97 % (08/02 1220) FiO2 (%):  [22 %] 22 % (08/01 2000)  Intake/Output from previous day: 08/01 0701 - 08/02 0700 In: 2051.6 [I.V.:1128.2; NG/GT:750; IV Piggyback:173.4] Out: 1300 [Urine:1100; Emesis/NG output:200] Intake/Output this shift: Total I/O In: 5 [I.V.:5] Out: 1320 [Emesis/NG output:600; Drains:720]  Physical Exam:  General: Alert and oriented Abdomen: Softer, no bowel sounds, drain with serous drainage Incisions: Dressing in place Ext: NT, No erythema  Lab Results: Recent Labs    10/26/21 0454 10/27/21 0417  HGB 9.3* 9.6*  HCT 28.4* 30.3*      Latest Ref Rng & Units 10/27/2021    4:17 AM 10/26/2021    4:54 AM 10/24/2021    3:55 AM  CBC  WBC 4.0 - 10.5 K/uL 18.4  21.3  16.0   Hemoglobin 13.0 - 17.0 g/dL 9.6  9.3  8.8   Hematocrit 39.0 - 52.0 % 30.3  28.4  27.6   Platelets 150 - 400 K/uL 592  562  470      BMET Recent Labs    10/25/21 0335 10/26/21 0454  NA 136 137  K 3.4* 3.7  CL 109 108  CO2 19* 21*  GLUCOSE 136* 146*  BUN 18 18  CREATININE 0.80 0.82  CALCIUM 7.7* 8.1*     Studies/Results: CT IMAGE GUIDED DRAINAGE PERCUT CATH  PERITONEAL RETROPERIT  Result Date: 10/27/2021 INDICATION: 66 year old male referred for abdominal fluid drainage EXAM: CT-GUIDED DRAINAGE OF ABDOMINAL FLUID COLLECTION TECHNIQUE: Multidetector CT imaging of the abdomen was performed following the standard protocol without IV contrast. RADIATION DOSE REDUCTION: This exam was performed according to the departmental  dose-optimization program which includes automated exposure control, adjustment of the mA and/or kV according to patient size and/or use of iterative reconstruction technique. MEDICATIONS: The patient is currently admitted to the hospital and receiving intravenous antibiotics. The antibiotics were administered within an appropriate time frame prior to the initiation of the procedure. ANESTHESIA/SEDATION: Moderate (conscious) sedation was employed during this procedure. A total of Versed 1.0 mg and Fentanyl 50 mcg was administered intravenously by the radiology nurse. Total intra-service moderate Sedation Time: 13 minutes. The patient's level of consciousness and vital signs were monitored continuously by radiology nursing throughout the procedure under my direct supervision. COMPLICATIONS: None PROCEDURE: Informed written consent was obtained from the patient after a thorough discussion of the procedural risks, benefits and alternatives. All questions were addressed. Maximal Sterile Barrier Technique was utilized including caps, mask, sterile gowns, sterile gloves, sterile drape, hand hygiene and skin antiseptic. A timeout was performed prior to the initiation of the procedure. Patient positioned supine position on the CT gantry table. Scout CT was performed for planning purposes. The patient was then prepped and draped in the usual sterile fashion. 1% lidocaine was used for local anesthesia. Using CT guidance, Yueh needle was advanced into the fluid collection in the anterior abdomen from a right lower abdominal approach. Once we confirmed needle tip position, modified Seldinger technique was used to place a 12 [place] drain. Approximately 520  cc of murky thin fluid aspirated. Sample sent for culture. Gravity bag was attached and the drain was sutured in position. Final image was acquired. Patient tolerated the procedure well and remained hemodynamically stable throughout. No complications were encountered and no  significant blood loss. IMPRESSION: Status post CT-guided drainage of anterior abdominal fluid collection. Signed, Dulcy Fanny. Nadene Rubins, RPVI [facility] Electronically Signed   By: Corrie Mckusick D.O.   On: 10/27/2021 11:38   CT ABDOMEN PELVIS W CONTRAST  Result Date: 10/26/2021 CLINICAL DATA:  Postop abdominal pain status post exploratory laparotomy, lysis of adhesions, and small-bowel resection. EXAM: CT ABDOMEN AND PELVIS WITH CONTRAST TECHNIQUE: Multidetector CT imaging of the abdomen and pelvis was performed using the standard protocol following bolus administration of intravenous contrast. RADIATION DOSE REDUCTION: This exam was performed according to the departmental dose-optimization program which includes automated exposure control, adjustment of the mA and/or kV according to patient size and/or use of iterative reconstruction technique. CONTRAST:  177m OMNIPAQUE IOHEXOL 300 MG/ML  SOLN COMPARISON:  10/17/2021 FINDINGS: Lower chest: There are small bilateral pleural effusions, left greater than right. Bilateral lower lobe atelectasis. Hepatobiliary: No suspicious liver lesion. Cyst within central right lobe of liver measures 2.7 cm, image 22/2. Tiny stones identified within the dependent portion of the gallbladder, image 31/2. Pancreas: Unremarkable. No pancreatic ductal dilatation or surrounding inflammatory changes. Spleen: Normal in size without focal abnormality. Adrenals/Urinary Tract: Normal adrenal glands. No kidney mass or hydronephrosis identified. Partially decompressed urinary bladder is grossly unremarkable. Stomach/Bowel: There is a nasogastric tube with tip terminating in the gastric fundus. Signs of recent small bowel resection identified with anastomotic suture chain identified in the left lower quadrant of the abdomen, image 60/2. The proximal small bowel loops appear increased in caliber measuring up to 3.9 cm. Transition  to decreased caliber small bowel noted within the left lower quadrant of the abdomen, image 37/7 and image 70/2. Distal small bowel loops appear decompressed. No pathologic dilatation of the colon. Vascular/Lymphatic: Aortic atherosclerosis without aneurysm. No signs of abdominopelvic adenopathy. Reproductive: Prostate gland is not visualized and may be surgically absent. Other: There is a large peripherally enhancing fluid collection within the ventral abdomen exhibiting mass effect and displacing the bowel loops posteriorly and laterally. This measures 17.4 x 8.5 by 23.1 cm (volume = 1800 cm^3), image 64/8 and image 55/2. Scattered locules of gas are identified within this fluid collection. Smaller fluid collection is noted within the posterior pelvis measuring 5.3 by 3.2 by 2.8 cm (volume = 25 cm^3), image 88/2. Moderate pneumoperitoneum is identified. Musculoskeletal: No acute or significant osseous findings. IMPRESSION: 1. Signs of recent small bowel resection with anastomotic suture chain in the left lower quadrant of the abdomen. There is abnormal dilatation of the proximal and mid small bowel loops with transition to decreased caliber distal small bowel. Differential excludes recurrent small bowel obstruction versus postoperative ileus. 2. Large peripherally enhancing fluid collection within the ventral abdomen exhibiting mass effect and displacing the bowel loops posteriorly and laterally. Cannot exclude underlying abscess. 3. Smaller fluid collection is noted within the posterior pelvis measuring up to 5.3 cm. 4. Moderate pneumoperitoneum. 5. Small bilateral pleural effusions, left greater than right. 6. Cholelithiasis. 7. Aortic Atherosclerosis (ICD10-I70.0). Electronically Signed   By: TKerby MoorsM.D.   On: 10/26/2021 14:27    Assessment/Plan: POD # 23 s/p robotic prostatectomy and POD # 7 s/p exploratory laparotomy, adhesiolysis, small bowel resection by General Surgery - Clinical management  per  General Surgery.  Will continue to monitor.  DVT prophylaxis.  Will check drain Cr level as a precaution.   LOS: 22 days   [name] 10/27/2021, 3:57 PM

## 2021-10-28 LAB — COMPREHENSIVE METABOLIC PANEL
ALT: 81 U/L — ABNORMAL HIGH (ref 0–44)
AST: 20 U/L (ref 15–41)
Albumin: <1.5 g/dL — ABNORMAL LOW (ref 3.5–5.0)
Alkaline Phosphatase: 182 U/L — ABNORMAL HIGH (ref 38–126)
Anion gap: 7 (ref 5–15)
BUN: 20 mg/dL (ref 8–23)
CO2: 24 mmol/L (ref 22–32)
Calcium: 7.7 mg/dL — ABNORMAL LOW (ref 8.9–10.3)
Chloride: 104 mmol/L (ref 98–111)
Creatinine, Ser: 0.87 mg/dL (ref 0.61–1.24)
GFR, Estimated: >60 mL/min (ref >60)
Glucose, Bld: 143 mg/dL — ABNORMAL HIGH (ref 70–99)
Potassium: 4.2 mmol/L (ref 3.5–5.1)
Sodium: 135 mmol/L (ref 135–145)
Total Bilirubin: 0.3 mg/dL (ref 0.3–1.2)
Total Protein: 5.7 g/dL — ABNORMAL LOW (ref 6.5–8.1)

## 2021-10-28 LAB — PHOSPHORUS: Phosphorus: 3.3 mg/dL (ref 2.5–4.6)

## 2021-10-28 LAB — GLUCOSE, CAPILLARY
Glucose-Capillary: 142 mg/dL — ABNORMAL HIGH (ref 70–99)
Glucose-Capillary: 148 mg/dL — ABNORMAL HIGH (ref 70–99)
Glucose-Capillary: 151 mg/dL — ABNORMAL HIGH (ref 70–99)

## 2021-10-28 LAB — TRIGLYCERIDES: Triglycerides: 90 mg/dL (ref <150)

## 2021-10-28 LAB — MAGNESIUM: Magnesium: 2.1 mg/dL (ref 1.7–2.4)

## 2021-10-28 MED ORDER — TRAVASOL 10 % IV SOLN
INTRAVENOUS | Status: AC
  Filled 2021-10-28: qty 1123.2

## 2021-10-28 MED ORDER — PIPERACILLIN-TAZOBACTAM 3.375 G IVPB
3.3750 g | Freq: Three times a day (TID) | INTRAVENOUS | Status: DC
  Administered 2021-10-28 – 2021-11-06 (×27): 3.375 g via INTRAVENOUS
  Filled 2021-10-28 (×27): qty 50

## 2021-10-28 MED ORDER — ACETAMINOPHEN 10 MG/ML IV SOLN
1000.0000 mg | Freq: Four times a day (QID) | INTRAVENOUS | Status: AC
Start: 2021-10-28 — End: 2021-10-29
  Administered 2021-10-28 – 2021-10-29 (×4): 1000 mg via INTRAVENOUS
  Filled 2021-10-28 (×4): qty 100

## 2021-10-28 NOTE — Progress Notes (Signed)
Referring Physician(s): Stechschulte, P  Supervising Physician: Ruthann Cancer  Patient Status:  [facility] - In-pt  Chief Complaint:  Abdominal fluid collection s/p RLQ drain placement 10/27/21 by Dr Earleen Newport  Subjective:  Daughter in room with patient today. Patient awake, alert, and sitting in chair at time of exam. He states he has no questions or concerns regarding his drain, and states that his drain insertion site is not painful.  Allergies: Patient has no known allergies.  Medications: Prior to Admission medications   Medication Sig Start Date End Date Taking? Authorizing Provider  atorvastatin (LIPITOR) 80 MG tablet Take 80 mg by mouth at bedtime. 08/19/21  Yes [provider]  docusate sodium (COLACE) 100 MG capsule Take 1 capsule (100 mg total) by mouth 2 (two) times daily. 10/04/21  Yes Dancy, Estill Bamberg, PA-C  finasteride (PROSCAR) 5 MG tablet Take 5 mg by mouth daily. 09/05/21  Yes [provider]  sulfamethoxazole-trimethoprim (BACTRIM DS) 800-160 MG tablet Take 1 tablet by mouth 2 (two) times daily. Start the day prior to foley removal appointment 10/04/21  Yes Dancy, Estill Bamberg, PA-C  tamsulosin (FLOMAX) 0.4 MG CAPS capsule Take 0.4 mg by mouth at bedtime. 08/19/21  Yes [provider]  traMADol (ULTRAM) 50 MG tablet Take 1-2 tablets (50-100 mg total) by mouth every 6 (six) hours as needed for moderate pain or severe pain. 10/04/21  Yes Debbrah Alar, PA-C     Vital Signs: BP 136/72 (BP Location: Left Arm)   Pulse (!) 104   Temp 98.6 F (37 C) (Oral)   Resp 12   Ht '5\' 11"'$  (1.803 m)   Wt 165 lb 12.6 oz (75.2 kg)   SpO2 97%   BMI 23.12 kg/m   Physical Exam Vitals reviewed.  Cardiovascular:     Rate and Rhythm: Tachycardia present.  Pulmonary:     Effort: Pulmonary effort is normal.  Abdominal:     General: There is distension.     Palpations: Abdomen is soft.     Comments: NG tube in place with bilious output noted  Skin:    General: Skin is  warm and dry.  Neurological:     Mental Status: He is alert.     Drain Location: RLQ Size: Fr size: 12 Fr Date of placement: 10/27/21  Currently to: Drain collection device: gravity 24 hour output:  Output by Drain (mL) 10/26/21 0701 - 10/26/21 1900 10/26/21 1901 - 10/27/21 0700 10/27/21 0701 - 10/27/21 1900 10/27/21 1901 - 10/28/21 0700 10/28/21 0701 - 10/28/21 1052  Closed System Drain 1 Inferior Abdomen 12 Fr.   1220      Interval imaging/drain manipulation:  None  Current examination: Flushes/aspirates easily.  Insertion site unremarkable. Suture and stat lock in place. Dressed appropriately.  1220 mL of OP noted in chart Gravity bag currently has ~150 mL of serosanguinous output with some debris/purulence noted; OP not grossly purulent  Imaging: CT IMAGE GUIDED DRAINAGE PERCUT CATH  PERITONEAL RETROPERIT  Result Date: 10/27/2021 INDICATION: 66 year old male referred for abdominal fluid drainage EXAM: CT-GUIDED DRAINAGE OF ABDOMINAL FLUID COLLECTION TECHNIQUE: Multidetector CT imaging of the abdomen was performed following the standard protocol without IV contrast. RADIATION DOSE REDUCTION: This exam was performed according to the departmental dose-optimization program which includes automated exposure control, adjustment of the mA and/or kV according to patient size and/or use of iterative reconstruction technique. MEDICATIONS: The patient is currently admitted to the hospital and receiving intravenous antibiotics. The antibiotics were administered within an appropriate time frame  prior to the initiation of the procedure. ANESTHESIA/SEDATION: Moderate (conscious) sedation was employed during this procedure. A total of Versed 1.0 mg and Fentanyl 50 mcg was administered intravenously by the radiology nurse. Total intra-service moderate Sedation Time: 13 minutes. The patient's level of consciousness and vital signs were monitored continuously by radiology nursing throughout the procedure  under my direct supervision. COMPLICATIONS: None PROCEDURE: Informed written consent was obtained from the patient after a thorough discussion of the procedural risks, benefits and alternatives. All questions were addressed. Maximal Sterile Barrier Technique was utilized including caps, mask, sterile gowns, sterile gloves, sterile drape, hand hygiene and skin antiseptic. A timeout was performed prior to the initiation of the procedure. Patient positioned supine position on the CT gantry table. Scout CT was performed for planning purposes. The patient was then prepped and draped in the usual sterile fashion. 1% lidocaine was used for local anesthesia. Using CT guidance, Yueh needle was advanced into the fluid collection in the anterior abdomen from a right lower abdominal approach. Once we confirmed needle tip position, modified Seldinger technique was used to place a 12 [place] drain. Approximately 520 cc of murky thin fluid aspirated. Sample sent for culture. Gravity bag was attached and the drain was sutured in position. Final image was acquired. Patient tolerated the procedure well and remained hemodynamically stable throughout. No complications were encountered and no significant blood loss. IMPRESSION: Status post CT-guided drainage of anterior abdominal fluid collection. Signed, Dulcy Fanny. Nadene Rubins, RPVI [facility] Radiology Electronically Signed   By: Corrie Mckusick D.O.   On: 10/27/2021 11:38   CT ABDOMEN PELVIS W CONTRAST  Result Date: 10/26/2021 CLINICAL DATA:  Postop abdominal pain status post exploratory laparotomy, lysis of adhesions, and small-bowel resection. EXAM: CT ABDOMEN AND PELVIS WITH CONTRAST TECHNIQUE: Multidetector CT imaging of the abdomen and pelvis was performed using the standard protocol following bolus administration of intravenous contrast. RADIATION DOSE REDUCTION: This exam was performed according to the departmental  dose-optimization program which includes automated exposure control, adjustment of the mA and/or kV according to patient size and/or use of iterative reconstruction technique. CONTRAST:  128m OMNIPAQUE IOHEXOL 300 MG/ML  SOLN COMPARISON:  10/17/2021 FINDINGS: Lower chest: There are small bilateral pleural effusions, left greater than right. Bilateral lower lobe atelectasis. Hepatobiliary: No suspicious liver lesion. Cyst within central right lobe of liver measures 2.7 cm, image 22/2. Tiny stones identified within the dependent portion of the gallbladder, image 31/2. Pancreas: Unremarkable. No pancreatic ductal dilatation or surrounding inflammatory changes. Spleen: Normal in size without focal abnormality. Adrenals/Urinary Tract: Normal adrenal glands. No kidney mass or hydronephrosis identified. Partially decompressed urinary bladder is grossly unremarkable. Stomach/Bowel: There is a nasogastric tube with tip terminating in the gastric fundus. Signs of recent small bowel resection identified with anastomotic suture chain identified in the left lower quadrant of the abdomen, image 60/2. The proximal small bowel loops appear increased in caliber measuring up to 3.9 cm. Transition to decreased caliber small bowel noted within the left lower quadrant of the abdomen, image 37/7 and image 70/2. Distal small bowel loops appear decompressed. No pathologic dilatation of the colon. Vascular/Lymphatic: Aortic atherosclerosis without aneurysm. No signs of abdominopelvic adenopathy. Reproductive: Prostate gland is not visualized and may be surgically absent. Other: There is a large peripherally enhancing fluid collection within the ventral abdomen exhibiting mass effect and displacing the bowel loops posteriorly and laterally. This measures 17.4 x 8.5 by 23.1 cm (volume = 1800 cm^3), image 64/8 and  image 55/2. Scattered locules of gas are identified within this fluid collection. Smaller fluid collection is noted within the  posterior pelvis measuring 5.3 by 3.2 by 2.8 cm (volume = 25 cm^3), image 88/2. Moderate pneumoperitoneum is identified. Musculoskeletal: No acute or significant osseous findings. IMPRESSION: 1. Signs of recent small bowel resection with anastomotic suture chain in the left lower quadrant of the abdomen. There is abnormal dilatation of the proximal and mid small bowel loops with transition to decreased caliber distal small bowel. Differential excludes recurrent small bowel obstruction versus postoperative ileus. 2. Large peripherally enhancing fluid collection within the ventral abdomen exhibiting mass effect and displacing the bowel loops posteriorly and laterally. Cannot exclude underlying abscess. 3. Smaller fluid collection is noted within the posterior pelvis measuring up to 5.3 cm. 4. Moderate pneumoperitoneum. 5. Small bilateral pleural effusions, left greater than right. 6. Cholelithiasis. 7. Aortic Atherosclerosis (ICD10-I70.0). Electronically Signed   By: Kerby Moors M.D.   On: 10/26/2021 14:27   DG Abd Portable 1V  Result Date: 10/25/2021 CLINICAL DATA:  A 66 year old male presents for evaluation of postoperative ileus. EXAM: PORTABLE ABDOMEN - 1 VIEW COMPARISON:  October 22, 2021 FINDINGS: Mildly distended loops of bowel in the upper abdomen. Gas and stool in the RIGHT colon and transverse colon. The opacity of gas over the LEFT hemiabdomen with respect to colon and rectum. Gaseous distension of bowel loops in the upper and LEFT abdomen is diminished. Gastric tube remains in place tip in the fundus, side port below GE junction. Basilar airspace disease and midline skin staples similar to prior imaging. No acute regional skeletal process. IMPRESSION: 1. Findings of improving ileus. No high-grade obstruction or free air. 2. Gastric tube remains in place. 3. Bibasilar airspace disease, attention on follow-up, potentially atelectasis. Electronically Signed   By: Zetta Bills M.D.   On: 10/25/2021 09:55     Labs:  CBC: Recent Labs    10/23/21 0323 10/24/21 0355 10/26/21 0454 10/27/21 0417  WBC 20.3* 16.0* 21.3* 18.4*  HGB 9.1* 8.8* 9.3* 9.6*  HCT 27.9* 27.6* 28.4* 30.3*  PLT 425* 470* 562* 592*    COAGS: Recent Labs    10/27/21 0417  INR 1.1    BMP: Recent Labs    10/24/21 0355 10/25/21 0335 10/26/21 0454 10/28/21 0445  NA 136 136 137 135  K 3.4* 3.4* 3.7 4.2  CL 108 109 108 104  CO2 20* 19* 21* 24  GLUCOSE 137* 136* 146* 143*  BUN '20 18 18 20  '$ CALCIUM 7.7* 7.7* 8.1* 7.7*  CREATININE 0.81 0.80 0.82 0.87  GFRNONAA >60 >60 >60 >60    LIVER FUNCTION TESTS: Recent Labs    10/20/21 0213 10/21/21 0337 10/25/21 0335 10/28/21 0445  BILITOT 0.4 0.4 0.5 0.3  AST 27 20 99* 20  ALT 153* 97* 153* 81*  ALKPHOS 114 96 237* 182*  PROT 6.0* 5.7* 5.5* 5.7*  ALBUMIN 2.1* 1.9* <1.5* <1.5*    Assessment and Plan:  Ivan Lopez is a 66 yo male with PMH of prostate cancer s/p laparoscopic prostatectomy 10/04/21 and s/p exploratory laparotomy with lysis of adhesions and small bowel resection on 10/20/21. Patient is currently s/p RLQ drain placement by Dr Earleen Newport on 8/2 for an abdominal fluid collection noted on CT on 8/1.   Current examination of drain reveals no concern for infection at drain site.  Plan: Continue TID flushes with 5 cc NS. Record output Q shift. Dressing changes QD or PRN if soiled.  Call IR APP or  on call IR MD if difficulty flushing or sudden change in drain output.  Repeat imaging/possible drain injection once output < 10 mL/QD (excluding flush material). Consideration for drain removal if output is < 10 mL/QD (excluding flush material), pending discussion with the providing surgical service.  Discharge planning: Please contact IR APP or on call IR MD prior to patient d/c to ensure appropriate follow up plans are in place. Typically patient will follow up with IR clinic 10-14 days post d/c for repeat imaging/possible drain injection. IR scheduler  will contact patient with date/time of appointment. Patient will need to flush drain QD with 5 cc NS, record output QD, dressing changes every 2-3 days or earlier if soiled.   IR will continue to follow - please call with questions or concerns.     Electronically Signed: Lura Em, PA-C 10/28/2021, 10:52 AM   I spent a total of 15 Minutes at the the patient's bedside AND on the patient's hospital floor or unit, greater than 50% of which was counseling/coordinating care for Abdominal fluid collection s/p RLQ drain placement 10/27/21 by Dr Earleen Newport

## 2021-10-28 NOTE — Progress Notes (Signed)
Progress Note  8 Days Post-Op  Subjective: No flatus or BM. IR drain placed yesterday, large amount of output but not bilious. Patient reports abdominal pain is slightly improved. Wife at bedside as well.   Objective: Vital signs in last 24 hours: Temp:  [98.6 F (37 C)-100 F (37.8 C)] 98.6 F (37 C) (08/03 0446) Pulse Rate:  [104-115] 104 (08/03 0446) Resp:  [12-28] 12 (08/03 0836) BP: (124-145)/(72-96) 136/72 (08/03 0446) SpO2:  [96 %-99 %] 97 % (08/03 0836) Last BM Date : 10/21/21  Intake/Output from previous day: 08/02 0701 - 08/03 0700 In: 2750.7 [I.V.:1864.1; IV Piggyback:886.6] Out: 3220 [Urine:1400; Emesis/NG output:600; Drains:1220] Intake/Output this shift: No intake/output data recorded.  PE: Gen:  Alert, NAD, pleasant Pulm:  Normal effort Abd: abdomen distended but soft, appropriately ttp, incision C/D/I with staples present, NGT with thin bilious drainage, BS hypoactive, Drain with ss fluid maybe a little cloudy but not bilious and not frankly purulent Skin: warm and dry, no rashes  Psych: A&Ox3    Lab Results:  Recent Labs    10/26/21 0454 10/27/21 0417  WBC 21.3* 18.4*  HGB 9.3* 9.6*  HCT 28.4* 30.3*  PLT 562* 592*    BMET Recent Labs    10/26/21 0454 10/28/21 0445  NA 137 135  K 3.7 4.2  CL 108 104  CO2 21* 24  GLUCOSE 146* 143*  BUN 18 20  CREATININE 0.82 0.87  CALCIUM 8.1* 7.7*    PT/INR Recent Labs    10/27/21 0417  LABPROT 14.6  INR 1.1    CMP     Component Value Date/Time   NA 135 10/28/2021 0445   K 4.2 10/28/2021 0445   CL 104 10/28/2021 0445   CO2 24 10/28/2021 0445   GLUCOSE 143 (H) 10/28/2021 0445   BUN 20 10/28/2021 0445   CREATININE 0.87 10/28/2021 0445   CALCIUM 7.7 (L) 10/28/2021 0445   PROT 5.7 (L) 10/28/2021 0445   ALBUMIN <1.5 (L) 10/28/2021 0445   AST 20 10/28/2021 0445   ALT 81 (H) 10/28/2021 0445   ALKPHOS 182 (H) 10/28/2021 0445   BILITOT 0.3 10/28/2021 0445   GFRNONAA >60 10/28/2021 0445    Lipase  No results found for: "LIPASE"     Studies/Results: CT IMAGE GUIDED DRAINAGE PERCUT CATH  PERITONEAL RETROPERIT  Result Date: 10/27/2021 INDICATION: 66 year old male referred for abdominal fluid drainage EXAM: CT-GUIDED DRAINAGE OF ABDOMINAL FLUID COLLECTION TECHNIQUE: Multidetector CT imaging of the abdomen was performed following the standard protocol without IV contrast. RADIATION DOSE REDUCTION: This exam was performed according to the departmental dose-optimization program which includes automated exposure control, adjustment of the mA and/or kV according to patient size and/or use of iterative reconstruction technique. MEDICATIONS: The patient is currently admitted to the hospital and receiving intravenous antibiotics. The antibiotics were administered within an appropriate time frame prior to the initiation of the procedure. ANESTHESIA/SEDATION: Moderate (conscious) sedation was employed during this procedure. A total of Versed 1.0 mg and Fentanyl 50 mcg was administered intravenously by the radiology nurse. Total intra-service moderate Sedation Time: 13 minutes. The patient's level of consciousness and vital signs were monitored continuously by radiology nursing throughout the procedure under my direct supervision. COMPLICATIONS: None PROCEDURE: Informed written consent was obtained from the patient after a thorough discussion of the procedural risks, benefits and alternatives. All questions were addressed. Maximal Sterile Barrier Technique was utilized including caps, mask, sterile gowns, sterile gloves, sterile drape, hand hygiene and skin antiseptic. A timeout was performed  prior to the initiation of the procedure. Patient positioned supine position on the CT gantry table. Scout CT was performed for planning purposes. The patient was then prepped and draped in the usual sterile fashion. 1% lidocaine was used for local anesthesia. Using CT guidance, Yueh needle was advanced into the  fluid collection in the anterior abdomen from a right lower abdominal approach. Once we confirmed needle tip position, modified Seldinger technique was used to place a 12 [place] drain. Approximately 520 cc of murky thin fluid aspirated. Sample sent for culture. Gravity bag was attached and the drain was sutured in position. Final image was acquired. Patient tolerated the procedure well and remained hemodynamically stable throughout. No complications were encountered and no significant blood loss. IMPRESSION: Status post CT-guided drainage of anterior abdominal fluid collection. Signed, Dulcy Fanny. Nadene Rubins, RPVI [facility] Inter[facility] Radiology Special[facility] Radiology Electronically Signed   By: Corrie Mckusick D.O.   On: 10/27/2021 11:38   CT ABDOMEN PELVIS W CONTRAST  Result Date: 10/26/2021 CLINICAL DATA:  Postop abdominal pain status post exploratory laparotomy, lysis of adhesions, and small-bowel resection. EXAM: CT ABDOMEN AND PELVIS WITH CONTRAST TECHNIQUE: Multidetector CT imaging of the abdomen and pelvis was performed using the standard protocol following bolus administration of intravenous contrast. RADIATION DOSE REDUCTION: This exam was performed according to the departmental dose-optimization program which includes automated exposure control, adjustment of the mA and/or kV according to patient size and/or use of iterative reconstruction technique. CONTRAST:  12m OMNIPAQUE IOHEXOL 300 MG/ML  SOLN COMPARISON:  10/17/2021 FINDINGS: Lower chest: There are small bilateral pleural effusions, left greater than right. Bilateral lower lobe atelectasis. Hepatobiliary: No suspicious liver lesion. Cyst within central right lobe of liver measures 2.7 cm, image 22/2. Tiny stones identified within the dependent portion of the gallbladder, image 31/2. Pancreas: Unremarkable. No pancreatic ductal dilatation or surrounding inflammatory changes. Spleen: Normal in size without focal abnormality.  Adrenals/Urinary Tract: Normal adrenal glands. No kidney mass or hydronephrosis identified. Partially decompressed urinary bladder is grossly unremarkable. Stomach/Bowel: There is a nasogastric tube with tip terminating in the gastric fundus. Signs of recent small bowel resection identified with anastomotic suture chain identified in the left lower quadrant of the abdomen, image 60/2. The proximal small bowel loops appear increased in caliber measuring up to 3.9 cm. Transition to decreased caliber small bowel noted within the left lower quadrant of the abdomen, image 37/7 and image 70/2. Distal small bowel loops appear decompressed. No pathologic dilatation of the colon. Vascular/Lymphatic: Aortic atherosclerosis without aneurysm. No signs of abdominopelvic adenopathy. Reproductive: Prostate gland is not visualized and may be surgically absent. Other: There is a large peripherally enhancing fluid collection within the ventral abdomen exhibiting mass effect and displacing the bowel loops posteriorly and laterally. This measures 17.4 x 8.5 by 23.1 cm (volume = 1800 cm^3), image 64/8 and image 55/2. Scattered locules of gas are identified within this fluid collection. Smaller fluid collection is noted within the posterior pelvis measuring 5.3 by 3.2 by 2.8 cm (volume = 25 cm^3), image 88/2. Moderate pneumoperitoneum is identified. Musculoskeletal: No acute or significant osseous findings. IMPRESSION: 1. Signs of recent small bowel resection with anastomotic suture chain in the left lower quadrant of the abdomen. There is abnormal dilatation of the proximal and mid small bowel loops with transition to decreased caliber distal small bowel. Differential excludes recurrent small bowel obstruction versus postoperative ileus. 2. Large peripherally enhancing fluid collection within the ventral abdomen exhibiting mass effect and displacing the bowel  loops posteriorly and laterally. Cannot exclude underlying abscess. 3. Smaller  fluid collection is noted within the posterior pelvis measuring up to 5.3 cm. 4. Moderate pneumoperitoneum. 5. Small bilateral pleural effusions, left greater than right. 6. Cholelithiasis. 7. Aortic Atherosclerosis (ICD10-I70.0). Electronically Signed   By: Kerby Moors M.D.   On: 10/26/2021 14:27    Anti-infectives: Anti-infectives (From admission, onward)    Start     Dose/Rate Route Frequency Ordered Stop   10/28/21 1000  piperacillin-tazobactam (ZOSYN) IVPB 3.375 g        3.375 g 12.5 mL/hr over 240 Minutes Intravenous Every 8 hours 10/28/21 0904     10/20/21 1000  cefoTEtan (CEFOTAN) 2 g in sodium chloride 0.9 % 100 mL IVPB        2 g 200 mL/hr over 30 Minutes Intravenous  Once 10/19/21 1309 10/20/21 1018   10/20/21 0946  sodium chloride 0.9 % with cefoTEtan (CEFOTAN) ADS Med       Note to Pharmacy: Karsten Ro: cabinet override      10/20/21 0946 10/20/21 0949   10/14/21 1430  sulfamethoxazole-trimethoprim (BACTRIM DS) 800-160 MG per tablet 1 tablet        1 tablet Oral Every 12 hours 10/14/21 1332 10/16/21 2152   10/04/21 2330  ceFAZolin (ANCEF) IVPB 1 g/50 mL premix        1 g 100 mL/hr over 30 Minutes Intravenous Every 8 hours 10/04/21 1737 10/05/21 1014   10/04/21 0922  ceFAZolin (ANCEF) IVPB 2g/100 mL premix        2 g 200 mL/hr over 30 Minutes Intravenous 30 min pre-op 10/04/21 2694 10/04/21 1530   10/04/21 0000  sulfamethoxazole-trimethoprim (BACTRIM DS) 800-160 MG tablet        1 tablet Oral 2 times daily 10/04/21 1048          Assessment/Plan POD#24 - robotic assisted laparoscopic prostatectomy Dr. Alinda Money, and laparoscopic LOA with oversew of small bowel serosal injuries x2 Dr. Brantley Stage 7/10 POD#8 - exploratory laparotomy with LOA and small bowel resection Dr. Dema Severin 7/26 - await resolution of post op ileus - continue NGT to LIWS and TPN  - CT 8/1 with large intra-abdominal fluid collection - s/p IR drain placement 8/2, follow Cxs, start Zosyn and repeat CBC  tomorrow AM - continue PCA for pain control and IV tylenol and IV robaxin - continue to encourage mobilization    FEN: NPO, TPN, NGT to LIWS VTE: SQH ID: [email or web address] 7/26  Foley: out 7/27 and voiding   LOS: 23 days    Norm Parcel, [facility] Surgery 10/28/2021, 9:05 AM Please see Amion for pager number during day hours 7:00am-4:30pm

## 2021-10-28 NOTE — Progress Notes (Signed)
PHARMACY - TOTAL PARENTERAL NUTRITION CONSULT NOTE   Indication: bowel obstruction  Patient Measurements: Height: '5\' 11"'$  (180.3 cm) Weight: 75.2 kg (165 lb 12.6 oz) IBW/kg (Calculated) : 75.3 TPN AdjBW (KG): 68.4 Body mass index is 23.12 kg/m.   Assessment:  66 yo male with hx HTN, HLD, prostate cancer s/p LUTS. S/p robotic assisted laparoscopic radical prostatectomy, bilateral robotic assisted laparoscopic pelvic lymphadenectomy, LOA, oversew of small bowel serosal injuries on 10/04/21.  Patient on TPN 10/11/21-10/16/21. CT on 10/17/21 with SBO with transition slightly above umbilicus. Pharmacy consulted 10/18/21 to resume TPN for SBO. Post-op ileus.  Glucose / Insulin: No hx DM.  A1c 5. - q8h CBGs <180 - 7 units SSI required in past 24 hours Electrolytes: K 4.2 improved, bicarb 24 now WNL, CoCa 9.7, Mg/Phos WNL Renal: Scr WNL Hepatic: Alkphos and AST/ALT improving. Tbili WNL. Albumin low at <1.5 Trig: 86  Intake / Output:  Intake: 2155m TPN/24h Output: LBM 7/27 NG to LIWS -600 documented last 24h Abdominal drain: 12251mUOP ~ 1400 L in 24 hrs  MIVF: d/c'd GI Meds: IV PPI/24h, IV Reglan '10mg'$ /6hr GI Imaging: 7/16: CT abd pelvis without abscess or fluid collection but concern for small bowel obstruction. 7/18: AXR: Findings are compatible with interval worsening of reported adynamic ileus 7/19: AXR 8hr delay: Findings compatible with un underlying partial small bowel obstruction. Oral contrast has reached the ascending colon >> 24hr delay shows persistent small bowel dilation measuring up to 4.8 cm.  Oral contrast throughout the colon. 7/23 CT abd pelvis with high-grade partial small bowel obstruction with marked dilation of proximal small bowel loops.  7/28 AXR: Gaseous distention of loops of bowel in the left hemiabdomen may reflect ileus or persistent obstruction 7/31: Abd Xray: improving ileus 8/1 CT: recurrent small bowel obstruction versus postoperative ileus. Large  peripherally enhancing fluid collection.Cannot exclude underlying abscess. Smaller fluid collection is noted within the posterior pelvis  GI Surgeries / Procedures:  7/10: surgery as per HPI 7/26: exploratory laparotomy with lysis of adhesions, small bowel resection, repair of small bowel serosa, repair of ascending colon serosa  8/2: IR image guided aspiration/drainage of ventral abdominal fluid collection-[id number] access: PICC 7/17 TPN start date: 7/17-7/22, resumed 7/24  Nutritional Goals:  Goal TPN rate is 90 mL/hr (provides 112 g of protein and 2198 kcals per day)  RD Assessment:  Estimated Needs Total Energy Estimated Needs: 2100-2300 kcal Total Protein Estimated Needs: 105-120 grams Total Fluid Estimated Needs: >/= 2.3 L/day  Current Nutrition:  NPO - NGT remains to LIWS TPN  Plan:   No change to TPN formula today. At 1800: TPN to goal 90 ml/hr  (112g AA, 64.8g lipids (29.4%), 324g dextrose (GIR 2.99), 2198 kcal) meeting 100% of patients needs  Electrolytes in TPN:  Na 70 mEq/L K 50 mEq/L  Ca 3 mEq/L  Mg 7 mEq/L Phos 18 mmol/L Cl:Ac Max acetate Add standard MVI and trace elements to TPN Continue Sensitive q8h SSI and adjust as needed  Monitor TPN labs on Mon/Thurs and PRN   Mindel Friscia S. RobAlford HighlandharmD, BCPS Clinical Staff Pharmacist [email or web address] RobWayland SalinasharmD, BCPS Clinical Pharmacist 10/28/2021 8:41 AM

## 2021-10-28 NOTE — Progress Notes (Signed)
Patient ID: Ivan Lopez, male   DOB: 05-16-1955, 66 y.o.   MRN: [id number]  8 Days Post-Op Subjective: Pt s/p percutaneous drainage of large fluid collection noted on CT yesterday. JP creatinine negative for urine leak. ~1200cc out from drain, NG output decreasing. Feeling distended but improved overall  Objective: Vital signs in last 24 hours: Temp:  [98.6 F (37 C)-100 F (37.8 C)] 98.6 F (37 C) (08/03 0446) Pulse Rate:  [104-115] 104 (08/03 0446) Resp:  [17-28] 18 (08/03 0446) BP: (124-145)/(72-96) 136/72 (08/03 0446) SpO2:  [96 %-99 %] 99 % (08/03 0446)  Intake/Output from previous day: 08/02 0701 - 08/03 0700 In: 2330.8 [I.V.:1864.1; IV Piggyback:466.7] Out: 3220 [Urine:1400; Emesis/NG output:600; Drains:1220] Intake/Output this shift: Total I/O In: -  Out: 700 [Urine:700]  Physical Exam:  General: Alert and oriented Abdomen: Softer, no bowel sounds, drain with serous drainage Incisions: Dressing in place Ext: NT, No erythema  Lab Results: Recent Labs    10/26/21 0454 10/27/21 0417  HGB 9.3* 9.6*  HCT 28.4* 30.3*       Latest Ref Rng & Units 10/27/2021    4:17 AM 10/26/2021    4:54 AM 10/24/2021    3:55 AM  CBC  WBC 4.0 - 10.5 K/uL 18.4  21.3  16.0   Hemoglobin 13.0 - 17.0 g/dL 9.6  9.3  8.8   Hematocrit 39.0 - 52.0 % 30.3  28.4  27.6   Platelets 150 - 400 K/uL 592  562  470      BMET Recent Labs    10/26/21 0454 10/28/21 0445  NA 137 135  K 3.7 4.2  CL 108 104  CO2 21* 24  GLUCOSE 146* 143*  BUN 18 20  CREATININE 0.82 0.87  CALCIUM 8.1* 7.7*      Studies/Results: CT IMAGE GUIDED DRAINAGE PERCUT CATH  PERITONEAL RETROPERIT  Result Date: 10/27/2021 INDICATION: 66 year old male referred for abdominal fluid drainage EXAM: CT-GUIDED DRAINAGE OF ABDOMINAL FLUID COLLECTION TECHNIQUE: Multidetector CT imaging of the abdomen was performed following the standard protocol without IV contrast. RADIATION DOSE REDUCTION: This exam was performed according  to the departmental dose-optimization program which includes automated exposure control, adjustment of the mA and/or kV according to patient size and/or use of iterative reconstruction technique. MEDICATIONS: The patient is currently admitted to the hospital and receiving intravenous antibiotics. The antibiotics were administered within an appropriate time frame prior to the initiation of the procedure. ANESTHESIA/SEDATION: Moderate (conscious) sedation was employed during this procedure. A total of Versed 1.0 mg and Fentanyl 50 mcg was administered intravenously by the radiology nurse. Total intra-service moderate Sedation Time: 13 minutes. The patient's level of consciousness and vital signs were monitored continuously by radiology nursing throughout the procedure under my direct supervision. COMPLICATIONS: None PROCEDURE: Informed written consent was obtained from the patient after a thorough discussion of the procedural risks, benefits and alternatives. All questions were addressed. Maximal Sterile Barrier Technique was utilized including caps, mask, sterile gowns, sterile gloves, sterile drape, hand hygiene and skin antiseptic. A timeout was performed prior to the initiation of the procedure. Patient positioned supine position on the CT gantry table. Scout CT was performed for planning purposes. The patient was then prepped and draped in the usual sterile fashion. 1% lidocaine was used for local anesthesia. Using CT guidance, Yueh needle was advanced into the fluid collection in the anterior abdomen from a right lower abdominal approach. Once we confirmed needle tip position, modified Seldinger technique was used to place a 12 [place] drain.  Approximately 520 cc of murky thin fluid aspirated. Sample sent for culture. Gravity bag was attached and the drain was sutured in position. Final image was acquired. Patient tolerated the procedure well and remained hemodynamically stable throughout. No complications were  encountered and no significant blood loss. IMPRESSION: Status post CT-guided drainage of anterior abdominal fluid collection. Signed, Dulcy Fanny. Nadene Rubins, RPVI [facility] Interventional Radiology Special[facility] Radiology Electronically Signed   By: Corrie Mckusick D.O.   On: 10/27/2021 11:38   CT ABDOMEN PELVIS W CONTRAST  Result Date: 10/26/2021 CLINICAL DATA:  Postop abdominal pain status post exploratory laparotomy, lysis of adhesions, and small-bowel resection. EXAM: CT ABDOMEN AND PELVIS WITH CONTRAST TECHNIQUE: Multidetector CT imaging of the abdomen and pelvis was performed using the standard protocol following bolus administration of intravenous contrast. RADIATION DOSE REDUCTION: This exam was performed according to the departmental dose-optimization program which includes automated exposure control, adjustment of the mA and/or kV according to patient size and/or use of iterative reconstruction technique. CONTRAST:  135m OMNIPAQUE IOHEXOL 300 MG/ML  SOLN COMPARISON:  10/17/2021 FINDINGS: Lower chest: There are small bilateral pleural effusions, left greater than right. Bilateral lower lobe atelectasis. Hepatobiliary: No suspicious liver lesion. Cyst within central right lobe of liver measures 2.7 cm, image 22/2. Tiny stones identified within the dependent portion of the gallbladder, image 31/2. Pancreas: Unremarkable. No pancreatic ductal dilatation or surrounding inflammatory changes. Spleen: Normal in size without focal abnormality. Adrenals/Urinary Tract: Normal adrenal glands. No kidney mass or hydronephrosis identified. Partially decompressed urinary bladder is grossly unremarkable. Stomach/Bowel: There is a nasogastric tube with tip terminating in the gastric fundus. Signs of recent small bowel resection identified with anastomotic suture chain identified in the left lower quadrant of the abdomen, image 60/2. The proximal small bowel loops appear increased in caliber measuring up to  3.9 cm. Transition to decreased caliber small bowel noted within the left lower quadrant of the abdomen, image 37/7 and image 70/2. Distal small bowel loops appear decompressed. No pathologic dilatation of the colon. Vascular/Lymphatic: Aortic atherosclerosis without aneurysm. No signs of abdominopelvic adenopathy. Reproductive: Prostate gland is not visualized and may be surgically absent. Other: There is a large peripherally enhancing fluid collection within the ventral abdomen exhibiting mass effect and displacing the bowel loops posteriorly and laterally. This measures 17.4 x 8.5 by 23.1 cm (volume = 1800 cm^3), image 64/8 and image 55/2. Scattered locules of gas are identified within this fluid collection. Smaller fluid collection is noted within the posterior pelvis measuring 5.3 by 3.2 by 2.8 cm (volume = 25 cm^3), image 88/2. Moderate pneumoperitoneum is identified. Musculoskeletal: No acute or significant osseous findings. IMPRESSION: 1. Signs of recent small bowel resection with anastomotic suture chain in the left lower quadrant of the abdomen. There is abnormal dilatation of the proximal and mid small bowel loops with transition to decreased caliber distal small bowel. Differential excludes recurrent small bowel obstruction versus postoperative ileus. 2. Large peripherally enhancing fluid collection within the ventral abdomen exhibiting mass effect and displacing the bowel loops posteriorly and laterally. Cannot exclude underlying abscess. 3. Smaller fluid collection is noted within the posterior pelvis measuring up to 5.3 cm. 4. Moderate pneumoperitoneum. 5. Small bilateral pleural effusions, left greater than right. 6. Cholelithiasis. 7. Aortic Atherosclerosis (ICD10-I70.0). Electronically Signed   By: TKerby MoorsM.D.   On: 10/26/2021 14:27    Assessment/Plan: POD # 24 s/p robotic prostatectomy and POD # 8 s/p exploratory laparotomy, adhesiolysis, small bowel resection by General Surgery POD #  1  from CT guided abdominal fluid aspiration - Clinical management per General Surgery.  Will continue to monitor.  DVT prophylaxis.    LOS: 23 days   [name] 10/28/2021, 6:48 AM

## 2021-10-29 LAB — CBC
HCT: 26.4 % — ABNORMAL LOW (ref 39.0–52.0)
Hemoglobin: 8.6 g/dL — ABNORMAL LOW (ref 13.0–17.0)
MCH: 28.9 pg (ref 26.0–34.0)
MCHC: 32.6 g/dL (ref 30.0–36.0)
MCV: 88.6 fL (ref 80.0–100.0)
Platelets: 616 10*3/uL — ABNORMAL HIGH (ref 150–400)
RBC: 2.98 MIL/uL — ABNORMAL LOW (ref 4.22–5.81)
RDW: 16 % — ABNORMAL HIGH (ref 11.5–15.5)
WBC: 21.4 10*3/uL — ABNORMAL HIGH (ref 4.0–10.5)
nRBC: 0.1 % (ref 0.0–0.2)

## 2021-10-29 LAB — AEROBIC/ANAEROBIC CULTURE W GRAM STAIN (SURGICAL/DEEP WOUND)

## 2021-10-29 LAB — GLUCOSE, CAPILLARY
Glucose-Capillary: 133 mg/dL — ABNORMAL HIGH (ref 70–99)
Glucose-Capillary: 143 mg/dL — ABNORMAL HIGH (ref 70–99)
Glucose-Capillary: 123 mg/dL — ABNORMAL HIGH (ref 70–99)

## 2021-10-29 MED ORDER — TRAVASOL 10 % IV SOLN
INTRAVENOUS | Status: AC
  Filled 2021-10-29: qty 1123.2

## 2021-10-29 MED ORDER — ACETAMINOPHEN 10 MG/ML IV SOLN
1000.0000 mg | Freq: Four times a day (QID) | INTRAVENOUS | Status: AC
Start: 2021-10-29 — End: 2021-10-30
  Administered 2021-10-29 – 2021-10-30 (×4): 1000 mg via INTRAVENOUS
  Filled 2021-10-29 (×4): qty 100

## 2021-10-29 MED ORDER — HYDROMORPHONE HCL 1 MG/ML IJ SOLN
0.5000 mg | INTRAMUSCULAR | Status: DC | PRN
  Administered 2021-10-29 – 2021-11-04 (×19): 1 mg via INTRAVENOUS
  Filled 2021-10-29 (×19): qty 1

## 2021-10-29 NOTE — Progress Notes (Signed)
Progress Note  9 Days Post-Op  Subjective: No flatus or BM. IR drain remains non-bilious. He sat up in the chair all day yesterday but has had trouble ambulating with everything that is attached to him. He is not really needing to use PCA much for pain control. IV tylenol and IV robaxin seem to be helping most and then he uses PCA for breakthrough (used <10x times total yesterday). Wife at bedside this AM.   Objective: Vital signs in last 24 hours: Temp:  [98.5 F (36.9 C)-99.1 F (37.3 C)] 98.5 F (36.9 C) (08/04 0355) Pulse Rate:  [101-108] 101 (08/04 0355) Resp:  [16-23] 20 (08/04 0829) BP: (107-148)/(75-81) 127/75 (08/04 0355) SpO2:  [95 %-100 %] 98 % (08/04 0829) Last BM Date : 10/21/21  Intake/Output from previous day: 08/03 0701 - 08/04 0700 In: 2926.7 [I.V.:2296.7; IV Piggyback:630] Out: 1400 [Urine:1100; Emesis/NG output:100; Drains:200] Intake/Output this shift: No intake/output data recorded.  PE: Gen:  Alert, NAD, pleasant Pulm:  Normal effort Abd: abdomen distended but soft, appropriately ttp, incision C/D/I with staples present, NGT with thin bilious drainage, BS hypoactive, Drain with ss fluid that is slightly cloudy but not bilious  Skin: warm and dry, no rashes  Psych: A&Ox3    Lab Results:  Recent Labs    10/27/21 0417 10/29/21 0330  WBC 18.4* 21.4*  HGB 9.6* 8.6*  HCT 30.3* 26.4*  PLT 592* 616*    BMET Recent Labs    10/28/21 0445  NA 135  K 4.2  CL 104  CO2 24  GLUCOSE 143*  BUN 20  CREATININE 0.87  CALCIUM 7.7*    PT/INR Recent Labs    10/27/21 0417  LABPROT 14.6  INR 1.1    CMP     Component Value Date/Time   NA 135 10/28/2021 0445   K 4.2 10/28/2021 0445   CL 104 10/28/2021 0445   CO2 24 10/28/2021 0445   GLUCOSE 143 (H) 10/28/2021 0445   BUN 20 10/28/2021 0445   CREATININE 0.87 10/28/2021 0445   CALCIUM 7.7 (L) 10/28/2021 0445   PROT 5.7 (L) 10/28/2021 0445   ALBUMIN <1.5 (L) 10/28/2021 0445   AST 20 10/28/2021  0445   ALT 81 (H) 10/28/2021 0445   ALKPHOS 182 (H) 10/28/2021 0445   BILITOT 0.3 10/28/2021 0445   GFRNONAA >60 10/28/2021 0445   Lipase  No results found for: "LIPASE"     Studies/Results: CT IMAGE GUIDED DRAINAGE PERCUT CATH  PERITONEAL RETROPERIT  Result Date: 10/27/2021 INDICATION: 66 year old male referred for abdominal fluid drainage EXAM: CT-GUIDED DRAINAGE OF ABDOMINAL FLUID COLLECTION TECHNIQUE: Multidetector CT imaging of the abdomen was performed following the standard protocol without IV contrast. RADIATION DOSE REDUCTION: This exam was performed according to the departmental dose-optimization program which includes automated exposure control, adjustment of the mA and/or kV according to patient size and/or use of iterative reconstruction technique. MEDICATIONS: The patient is currently admitted to the hospital and receiving intravenous antibiotics. The antibiotics were administered within an appropriate time frame prior to the initiation of the procedure. ANESTHESIA/SEDATION: Moderate (conscious) sedation was employed during this procedure. A total of Versed 1.0 mg and Fentanyl 50 mcg was administered intravenously by the radiology nurse. Total intra-service moderate Sedation Time: 13 minutes. The patient's level of consciousness and vital signs were monitored continuously by radiology nursing throughout the procedure under my direct supervision. COMPLICATIONS: None PROCEDURE: Informed written consent was obtained from the patient after a thorough discussion of the procedural risks, benefits and alternatives.  All questions were addressed. Maximal Sterile Barrier Technique was utilized including caps, mask, sterile gowns, sterile gloves, sterile drape, hand hygiene and skin antiseptic. A timeout was performed prior to the initiation of the procedure. Patient positioned supine position on the CT gantry table. Scout CT was performed for planning purposes. The patient was then prepped and  draped in the usual sterile fashion. 1% lidocaine was used for local anesthesia. Using CT guidance, Yueh needle was advanced into the fluid collection in the anterior abdomen from a right lower abdominal approach. Once we confirmed needle tip position, modified Seldinger technique was used to place a 12 [place] drain. Approximately 520 cc of murky thin fluid aspirated. Sample sent for culture. Gravity bag was attached and the drain was sutured in position. Final image was acquired. Patient tolerated the procedure well and remained hemodynamically stable throughout. No complications were encountered and no significant blood loss. IMPRESSION: Status post CT-guided drainage of anterior abdominal fluid collection. Signed, Dulcy Fanny. Nadene Rubins, RPVI Vascular and Interventional Radiology Specialists [facility] Radiology Electronically Signed   By: Corrie Mckusick D.O.   On: 10/27/2021 11:38    Anti-infectives: Anti-infectives (From admission, onward)    Start     Dose/Rate Route Frequency Ordered Stop   10/28/21 1000  piperacillin-tazobactam (ZOSYN) IVPB 3.375 g        3.375 g 12.5 mL/hr over 240 Minutes Intravenous Every 8 hours 10/28/21 0904     10/20/21 1000  cefoTEtan (CEFOTAN) 2 g in sodium chloride 0.9 % 100 mL IVPB        2 g 200 mL/hr over 30 Minutes Intravenous  Once 10/19/21 1309 10/20/21 1018   10/20/21 0946  sodium chloride 0.9 % with cefoTEtan (CEFOTAN) ADS Med       Note to Pharmacy: Karsten Ro: cabinet override      10/20/21 0946 10/20/21 0949   10/14/21 1430  sulfamethoxazole-trimethoprim (BACTRIM DS) 800-160 MG per tablet 1 tablet        1 tablet Oral Every 12 hours 10/14/21 1332 10/16/21 2152   10/04/21 2330  ceFAZolin (ANCEF) IVPB 1 g/50 mL premix        1 g 100 mL/hr over 30 Minutes Intravenous Every 8 hours 10/04/21 1737 10/05/21 1014   10/04/21 0922  ceFAZolin (ANCEF) IVPB 2g/100 mL premix        2 g 200 mL/hr over 30 Minutes Intravenous 30 min pre-op 10/04/21 9485  10/04/21 1530   10/04/21 0000  sulfamethoxazole-trimethoprim (BACTRIM DS) 800-160 MG tablet        1 tablet Oral 2 times daily 10/04/21 1048          Assessment/Plan POD#25 - robotic assisted laparoscopic prostatectomy Dr. Alinda Money, and laparoscopic LOA with oversew of small bowel serosal injuries x2 Dr. Brantley Stage 7/10 POD#9 - exploratory laparotomy with LOA and small bowel resection Dr. Dema Severin 7/26 - await resolution of post op ileus - continue NGT to LIWS and TPN  - CT 8/1 with large intra-abdominal fluid collection - s/p IR drain placement 8/2, follow Cxs, continue Zosyn, WBC 21K - continue IV tylenol and IV robaxin, DC PCA and use IV pushes q2h prn if needed - continue to encourage mobilization  - consider repeat imaging early next week if still not opening up    FEN: NPO, TPN, NGT to LIWS VTE: SQH ID: cefotetan 7/26; Zosyn 8/3>> Foley: out 7/27 and voiding   LOS: 24 days    Norm Parcel, [facility] Surgery 10/29/2021, 9:38 AM Please  see Amion for pager number during day hours 7:00am-4:30pm

## 2021-10-29 NOTE — Progress Notes (Signed)
PHARMACY - TOTAL PARENTERAL NUTRITION CONSULT NOTE   Indication: bowel obstruction  Patient Measurements: Height: '5\' 11"'$  (180.3 cm) Weight: 75.2 kg (165 lb 12.6 oz) IBW/kg (Calculated) : 75.3 TPN AdjBW (KG): 68.4 Body mass index is 23.12 kg/m.   Assessment:  66 yo male with hx HTN, HLD, prostate cancer s/p LUTS. S/p robotic assisted laparoscopic radical prostatectomy, bilateral robotic assisted laparoscopic pelvic lymphadenectomy, LOA, oversew of small bowel serosal injuries on 10/04/21.  Patient on TPN 10/11/21-10/16/21. CT on 10/17/21 with SBO with transition slightly above umbilicus. Pharmacy consulted 10/18/21 to resume TPN for SBO. Post-op ileus.  Glucose / Insulin: No hx DM.  A1c 5. - q8h CBGs <180 - 5 units SSI required in past 24 hours Electrolytes: No new BMET on 8/4. K 4.2 improved, bicarb 24 now WNL, CoCa 9.7, Mg/Phos WNL Renal: Scr WNL Hepatic: Alkphos and AST/ALT improving. Tbili WNL. Albumin low at <1.5 Trig: 86 ID: WBC con't to increase. No fever but +tachy. Zosyn 8/3>> Intake / Output:  --Intake: 2153m TPN/24h --Output: LBM 7/27 NG to LIWS -100 documented last 24h, bilious Abdominal drain: 1224m> down 2002mast 24h UOP ~ 1100 L in 24 hrs MIVF: d/c'd GI Meds: IV PPI/24h, IV Reglan '10mg'$ /6hr GI Imaging: 7/16: CT abd pelvis without abscess or fluid collection but concern for small bowel obstruction. 7/18: AXR: Findings are compatible with interval worsening of reported adynamic ileus 7/19: AXR 8hr delay: Findings compatible with un underlying partial small bowel obstruction. Oral contrast has reached the ascending colon >> 24hr delay shows persistent small bowel dilation measuring up to 4.8 cm.  Oral contrast throughout the colon. 7/23 CT abd pelvis with high-grade partial small bowel obstruction with marked dilation of proximal small bowel loops.  7/28 AXR: Gaseous distention of loops of bowel in the left hemiabdomen may reflect ileus or persistent  obstruction 7/31: Abd Xray: improving ileus 8/1 CT: recurrent small bowel obstruction versus postoperative ileus. Large peripherally enhancing fluid collection.Cannot exclude underlying abscess. Smaller fluid collection is noted within the posterior pelvis  GI Surgeries / Procedures:  7/10: surgery as per HPI 7/26: exploratory laparotomy with lysis of adhesions, small bowel resection, repair of small bowel serosa, repair of ascending colon serosa  8/2: IR image guided aspiration/drainage of ventral abdominal fluid collection-[id number] access: PICC 7/17 TPN start date: 7/17-7/22, resumed 7/24  Nutritional Goals:  Goal TPN rate is 90 mL/hr (provides 112 g of protein and 2198 kcals per day)  RD Assessment:  Estimated Needs Total Energy Estimated Needs: 2100-2300 kcal Total Protein Estimated Needs: 105-120 grams Total Fluid Estimated Needs: >/= 2.3 L/day  Current Nutrition:  NPO - NGT remains to LIWS TPN  Plan:   No change to TPN formula today. At 1800: TPN to goal 90 ml/hr  (112g AA, 64.8g lipids (29.4%), 324g dextrose (GIR 2.99), 2198 kcal) meeting 100% of patients needs  Electrolytes in TPN:  Na 70 mEq/L K 50 mEq/L  Ca 3 mEq/L  Mg 7 mEq/L Phos 18 mmol/L Cl:Ac Max acetate Add standard MVI and trace elements to TPN Continue Sensitive q8h SSI and adjust as needed  Monitor TPN labs on Mon/Thurs and PRN   Holliday Sheaffer S. RobeAlford HighlandarmD, BCPS Clinical Staff Pharmacist [email or web address] RobeWayland SalinasarmD, BCPS Clinical Pharmacist 10/29/2021 7:34 AM

## 2021-10-29 NOTE — Progress Notes (Signed)
PT Cancellation Note  Patient Details Name: Ivan Lopez MRN: [id number] DOB: 06-28-1955   Cancelled Treatment:     Pt amb in hallway with spouse Supervision level > 300 feet.    Nathanial Rancher 10/29/2021, 3:36 PM

## 2021-10-29 NOTE — Progress Notes (Signed)
Nutrition Follow-up  INTERVENTION:   -TPN management per Pharmacy  -Weigh patient  NUTRITION DIAGNOSIS:   Inadequate oral intake related to acute illness as evidenced by other (comment) (CLD and need for NGT to LIS).  NPO  GOAL:   Patient will meet greater than or equal to 90% of their needs  Meeting with TPN  MONITOR:   Diet advancement, Labs, Weight trends, I & O's, Other (Comment) (TPN regimen)  REASON FOR ASSESSMENT:   Consult New TPN/TNA  ASSESSMENT:   66 year-old male with medical history. On 7/10 he underwent robotic-assisted lap radical prostatectomy with pelvic lymphadenectomy. Medical course complicated by ileus with need for NGT placement for decompression and TPN initiation.  Significant Events: 7/10 admitted 7/13: NGT placed, set to suction 7/17: double lumen PICC placed in R brachial; TPN initiated 7/21: diet advanced to Soft  7/22: TPN stopped 7/23: NGT replaced, set to suction; diet downgraded to CLD 7/24: diet downgraded to NPO 7/25: TPN re-started 7/26: s/p ex lap, LOA, SB resection, repair of SB serosa, repair of ascending colon serosa 8/2: drain placement in IR  Pt continues to be NPO. Still awaiting resolution of post-op ileus. NGT set to suction.  TPN to continue at goal rate of 90 ml/hr, providing 2198 kcals and 112g protein).  Admission weight: 158 lbs Last weight 7/30: 165 lbs  Medications: Reglan  Labs reviewed: CBGs: 133-151  Diet Order:   Diet Order             Diet NPO time specified Except for: Ice Chips  Diet effective midnight                   EDUCATION NEEDS:   Education needs have been addressed  Skin:  Skin Assessment: Skin Integrity Issues: Skin Integrity Issues:: Incisions Incisions: upper umbilicus (5/63) and abdomen x2 (7/26)  Last BM:  7/27  Height:   Ht Readings from Last 1 Encounters:  10/20/21 '5\' 11"'$  (1.803 m)    Weight:   Wt Readings from Last 1 Encounters:  10/24/21 75.2 kg     BMI:  Body mass index is 23.12 kg/m.  Estimated Nutritional Needs:   Kcal:  2100-2300 kcal  Protein:  105-120 grams  Fluid:  >/= 2.3 L/day  Clayton Bibles, MS, RD, LDN Inpatient Clinical Dietitian Contact information available via Amion

## 2021-10-29 NOTE — Progress Notes (Signed)
Referring Physician(s): Stechschulte, P  Supervising Physician: Markus Daft  Patient Status:  [facility] - In-pt  Chief Complaint:  Abdominal fluid collection s/p RLQ drain placement 10/27/21 by Dr Earleen Newport  Subjective:  Patient awake and sitting in chair at time of exam. States he is in a little more pain than yesterday, but that this is generalized and not specific to his drain insertion site. Patient states he has not had a bowel movement or flatus since he has come to the hospital.  Allergies: Patient has no known allergies.  Medications: Prior to Admission medications   Medication Sig Start Date End Date Taking? Authorizing Provider  atorvastatin (LIPITOR) 80 MG tablet Take 80 mg by mouth at bedtime. 08/19/21  Yes [provider]  docusate sodium (COLACE) 100 MG capsule Take 1 capsule (100 mg total) by mouth 2 (two) times daily. 10/04/21  Yes Dancy, Estill Bamberg, PA-C  finasteride (PROSCAR) 5 MG tablet Take 5 mg by mouth daily. 09/05/21  Yes [provider]  sulfamethoxazole-trimethoprim (BACTRIM DS) 800-160 MG tablet Take 1 tablet by mouth 2 (two) times daily. Start the day prior to foley removal appointment 10/04/21  Yes Dancy, Estill Bamberg, PA-C  tamsulosin (FLOMAX) 0.4 MG CAPS capsule Take 0.4 mg by mouth at bedtime. 08/19/21  Yes [provider]  traMADol (ULTRAM) 50 MG tablet Take 1-2 tablets (50-100 mg total) by mouth every 6 (six) hours as needed for moderate pain or severe pain. 10/04/21  Yes Dancy, Estill Bamberg, PA-C     Vital Signs: BP (!) 143/81 (BP Location: Left Arm)   Pulse (!) 109   Temp 98.8 F (37.1 C) (Oral)   Resp 20   Ht '5\' 11"'$  (1.803 m)   Wt 165 lb 12.6 oz (75.2 kg)   SpO2 100%   BMI 23.12 kg/m   Physical Exam Vitals reviewed.  Cardiovascular:     Rate and Rhythm: Tachycardia present.  Pulmonary:     Effort: Pulmonary effort is normal.  Abdominal:     General: There is distension.     Palpations: Abdomen is soft.     Tenderness: There is  abdominal tenderness.     Comments: NG tube in place with bilious output noted. Mild tenderness to palpation of abdomen; not specific to drain insertion site  Skin:    General: Skin is warm and dry.  Neurological:     Mental Status: He is alert.     Drain Location: RLQ Size: Fr size: 12 Fr Date of placement: 10/27/21  Currently to: Drain collection device: gravity 24 hour output:  Output by Drain (mL) 10/26/21 0701 - 10/26/21 1900 10/26/21 1901 - 10/27/21 0700 10/27/21 0701 - 10/27/21 1900 10/27/21 1901 - 10/28/21 0700 10/28/21 0701 - 10/28/21 1052  Closed System Drain 1 Inferior Abdomen 12 Fr.   1220      Interval imaging/drain manipulation:  None  Current examination: Flushes/aspirates easily.  Insertion site unremarkable. Suture and stat lock in place. Dressed appropriately.  OP not noted in chart over past 24 hours Gravity bag currently has ~100 mL of cloudy yellow output with some debris noted; OP not grossly purulent  Imaging: CT IMAGE GUIDED DRAINAGE PERCUT CATH  PERITONEAL RETROPERIT  Result Date: 10/27/2021 INDICATION: 66 year old male referred for abdominal fluid drainage EXAM: CT-GUIDED DRAINAGE OF ABDOMINAL FLUID COLLECTION TECHNIQUE: Multidetector CT imaging of the abdomen was performed following the standard protocol without IV contrast. RADIATION DOSE REDUCTION: This exam was performed according to the departmental dose-optimization program which includes automated exposure control,  adjustment of the mA and/or kV according to patient size and/or use of iterative reconstruction technique. MEDICATIONS: The patient is currently admitted to the hospital and receiving intravenous antibiotics. The antibiotics were administered within an appropriate time frame prior to the initiation of the procedure. ANESTHESIA/SEDATION: Moderate (conscious) sedation was employed during this procedure. A total of Versed 1.0 mg and Fentanyl 50 mcg was administered intravenously by the radiology  nurse. Total intra-service moderate Sedation Time: 13 minutes. The patient's level of consciousness and vital signs were monitored continuously by radiology nursing throughout the procedure under my direct supervision. COMPLICATIONS: None PROCEDURE: Informed written consent was obtained from the patient after a thorough discussion of the procedural risks, benefits and alternatives. All questions were addressed. Maximal Sterile Barrier Technique was utilized including caps, mask, sterile gowns, sterile gloves, sterile drape, hand hygiene and skin antiseptic. A timeout was performed prior to the initiation of the procedure. Patient positioned supine position on the CT gantry table. Scout CT was performed for planning purposes. The patient was then prepped and draped in the usual sterile fashion. 1% lidocaine was used for local anesthesia. Using CT guidance, Yueh needle was advanced into the fluid collection in the anterior abdomen from a right lower abdominal approach. Once we confirmed needle tip position, modified Seldinger technique was used to place a 12 [place] drain. Approximately 520 cc of murky thin fluid aspirated. Sample sent for culture. Gravity bag was attached and the drain was sutured in position. Final image was acquired. Patient tolerated the procedure well and remained hemodynamically stable throughout. No complications were encountered and no significant blood loss. IMPRESSION: Status post CT-guided drainage of anterior abdominal fluid collection. Signed, Dulcy Fanny. Nadene Rubins, RPVI [facility] Electronically Signed   By: Corrie Mckusick D.O.   On: 10/27/2021 11:38   CT ABDOMEN PELVIS W CONTRAST  Result Date: 10/26/2021 CLINICAL DATA:  Postop abdominal pain status post exploratory laparotomy, lysis of adhesions, and small-bowel resection. EXAM: CT ABDOMEN AND PELVIS WITH CONTRAST TECHNIQUE: Multidetector CT imaging of the abdomen and  pelvis was performed using the standard protocol following bolus administration of intravenous contrast. RADIATION DOSE REDUCTION: This exam was performed according to the departmental dose-optimization program which includes automated exposure control, adjustment of the mA and/or kV according to patient size and/or use of iterative reconstruction technique. CONTRAST:  156m OMNIPAQUE IOHEXOL 300 MG/ML  SOLN COMPARISON:  10/17/2021 FINDINGS: Lower chest: There are small bilateral pleural effusions, left greater than right. Bilateral lower lobe atelectasis. Hepatobiliary: No suspicious liver lesion. Cyst within central right lobe of liver measures 2.7 cm, image 22/2. Tiny stones identified within the dependent portion of the gallbladder, image 31/2. Pancreas: Unremarkable. No pancreatic ductal dilatation or surrounding inflammatory changes. Spleen: Normal in size without focal abnormality. Adrenals/Urinary Tract: Normal adrenal glands. No kidney mass or hydronephrosis identified. Partially decompressed urinary bladder is grossly unremarkable. Stomach/Bowel: There is a nasogastric tube with tip terminating in the gastric fundus. Signs of recent small bowel resection identified with anastomotic suture chain identified in the left lower quadrant of the abdomen, image 60/2. The proximal small bowel loops appear increased in caliber measuring up to 3.9 cm. Transition to decreased caliber small bowel noted within the left lower quadrant of the abdomen, image 37/7 and image 70/2. Distal small bowel loops appear decompressed. No pathologic dilatation of the colon. Vascular/Lymphatic: Aortic atherosclerosis without aneurysm. No signs of abdominopelvic adenopathy. Reproductive: Prostate gland is not visualized and may be surgically absent. Other:  There is a large peripherally enhancing fluid collection within the ventral abdomen exhibiting mass effect and displacing the bowel loops posteriorly and laterally. This measures 17.4  x 8.5 by 23.1 cm (volume = 1800 cm^3), image 64/8 and image 55/2. Scattered locules of gas are identified within this fluid collection. Smaller fluid collection is noted within the posterior pelvis measuring 5.3 by 3.2 by 2.8 cm (volume = 25 cm^3), image 88/2. Moderate pneumoperitoneum is identified. Musculoskeletal: No acute or significant osseous findings. IMPRESSION: 1. Signs of recent small bowel resection with anastomotic suture chain in the left lower quadrant of the abdomen. There is abnormal dilatation of the proximal and mid small bowel loops with transition to decreased caliber distal small bowel. Differential excludes recurrent small bowel obstruction versus postoperative ileus. 2. Large peripherally enhancing fluid collection within the ventral abdomen exhibiting mass effect and displacing the bowel loops posteriorly and laterally. Cannot exclude underlying abscess. 3. Smaller fluid collection is noted within the posterior pelvis measuring up to 5.3 cm. 4. Moderate pneumoperitoneum. 5. Small bilateral pleural effusions, left greater than right. 6. Cholelithiasis. 7. Aortic Atherosclerosis (ICD10-I70.0). Electronically Signed   By: Kerby Moors M.D.   On: 10/26/2021 14:27    Labs:  CBC: Recent Labs    10/24/21 0355 10/26/21 0454 10/27/21 0417 10/29/21 0330  WBC 16.0* 21.3* 18.4* 21.4*  HGB 8.8* 9.3* 9.6* 8.6*  HCT 27.6* 28.4* 30.3* 26.4*  PLT 470* 562* 592* 616*     COAGS: Recent Labs    10/27/21 0417  INR 1.1     BMP: Recent Labs    10/24/21 0355 10/25/21 0335 10/26/21 0454 10/28/21 0445  NA 136 136 137 135  K 3.4* 3.4* 3.7 4.2  CL 108 109 108 104  CO2 20* 19* 21* 24  GLUCOSE 137* 136* 146* 143*  BUN '20 18 18 20  '$ CALCIUM 7.7* 7.7* 8.1* 7.7*  CREATININE 0.81 0.80 0.82 0.87  GFRNONAA >60 >60 >60 >60     LIVER FUNCTION TESTS: Recent Labs    10/20/21 0213 10/21/21 0337 10/25/21 0335 10/28/21 0445  BILITOT 0.4 0.4 0.5 0.3  AST 27 20 99* 20  ALT 153* 97*  153* 81*  ALKPHOS 114 96 237* 182*  PROT 6.0* 5.7* 5.5* 5.7*  ALBUMIN 2.1* 1.9* <1.5* <1.5*     Assessment and Plan:  Ivan Lopez is a 66 yo male with PMH of prostate cancer s/p laparoscopic prostatectomy 10/04/21 and s/p exploratory laparotomy with lysis of adhesions and small bowel resection on 10/20/21. Patient is currently s/p RLQ drain placement by Dr Earleen Newport on 8/2 for an abdominal fluid collection noted on CT on 8/1.   Current examination of drain reveals no concern for infection at drain site.  Plan: Continue TID flushes with 5 cc NS. Record output Q shift. Dressing changes QD or PRN if soiled.  Call IR APP or on call IR MD if difficulty flushing or sudden change in drain output.  Repeat imaging/possible drain injection once output < 10 mL/QD (excluding flush material). Consideration for drain removal if output is < 10 mL/QD (excluding flush material), pending discussion with the providing surgical service.  Discharge planning: Please contact IR APP or on call IR MD prior to patient d/c to ensure appropriate follow up plans are in place. Typically patient will follow up with IR clinic 10-14 days post d/c for repeat imaging/possible drain injection. IR scheduler will contact patient with date/time of appointment. Patient will need to flush drain QD with 5 cc NS, record  output QD, dressing changes every 2-3 days or earlier if soiled.   IR will continue to follow - please call with questions or concerns.     Electronically Signed: Lura Em, PA-C 10/29/2021, 12:44 PM   I spent a total of 15 Minutes at the the patient's bedside AND on the patient's hospital floor or unit, greater than 50% of which was counseling/coordinating care for Abdominal fluid collection s/p RLQ drain placement 10/27/21 by Dr Earleen Newport

## 2021-10-29 NOTE — Progress Notes (Signed)
Patient ID: Lathaniel Legate, male   DOB: 07-22-1955, 66 y.o.   MRN: [id number]  9 Days Post-Op Subjective: No acute events overnight. No bowel movements or flatus but patient feeling subjectively better and NG output low  Objective: Vital signs in last 24 hours: Temp:  [98.5 F (36.9 C)-98.9 F (37.2 C)] 98.8 F (37.1 C) (08/04 1239) Pulse Rate:  [101-109] 109 (08/04 1239) Resp:  [16-22] 20 (08/04 1239) BP: (127-148)/(75-81) 143/81 (08/04 1239) SpO2:  [95 %-100 %] 100 % (08/04 1239)  Intake/Output from previous day: 08/03 0701 - 08/04 0700 In: 2926.7 [I.V.:2296.7; IV Piggyback:630] Out: 1400 [Urine:1100; Emesis/NG output:100; Drains:200] Intake/Output this shift: Total I/O In: -  Out: 100 [Drains:100]  Physical Exam:  General: Alert and oriented Abdomen: Soft, slightly distended, drain with serous drainage Incisions: Dressing in place Ext: NT, No erythema  Lab Results: Recent Labs    10/27/21 0417 10/29/21 0330  HGB 9.6* 8.6*  HCT 30.3* 26.4*       Latest Ref Rng & Units 10/29/2021    3:30 AM 10/27/2021    4:17 AM 10/26/2021    4:54 AM  CBC  WBC 4.0 - 10.5 K/uL 21.4  18.4  21.3   Hemoglobin 13.0 - 17.0 g/dL 8.6  9.6  9.3   Hematocrit 39.0 - 52.0 % 26.4  30.3  28.4   Platelets 150 - 400 K/uL 616  592  562      BMET Recent Labs    10/28/21 0445  NA 135  K 4.2  CL 104  CO2 24  GLUCOSE 143*  BUN 20  CREATININE 0.87  CALCIUM 7.7*      Studies/Results: No results found.  Assessment/Plan: POD # 25 s/p robotic prostatectomy and POD # 9 s/p exploratory laparotomy, adhesiolysis, small bowel resection by General Surgery POD #2 from CT guided abdominal fluid aspiration - Clinical management per General Surgery.  Will continue to monitor.  DVT prophylaxis.    LOS: 24 days   [name] 10/29/2021, 1:34 PM

## 2021-10-30 LAB — BASIC METABOLIC PANEL
Anion gap: 7 (ref 5–15)
BUN: 24 mg/dL — ABNORMAL HIGH (ref 8–23)
CO2: 26 mmol/L (ref 22–32)
Calcium: 7.9 mg/dL — ABNORMAL LOW (ref 8.9–10.3)
Chloride: 100 mmol/L (ref 98–111)
Creatinine, Ser: 0.88 mg/dL (ref 0.61–1.24)
GFR, Estimated: >60 mL/min (ref >60)
Glucose, Bld: 132 mg/dL — ABNORMAL HIGH (ref 70–99)
Potassium: 4 mmol/L (ref 3.5–5.1)
Sodium: 133 mmol/L — ABNORMAL LOW (ref 135–145)

## 2021-10-30 LAB — GLUCOSE, CAPILLARY
Glucose-Capillary: 143 mg/dL — ABNORMAL HIGH (ref 70–99)
Glucose-Capillary: 131 mg/dL — ABNORMAL HIGH (ref 70–99)
Glucose-Capillary: 125 mg/dL — ABNORMAL HIGH (ref 70–99)

## 2021-10-30 LAB — PHOSPHORUS: Phosphorus: 3.7 mg/dL (ref 2.5–4.6)

## 2021-10-30 LAB — MAGNESIUM: Magnesium: 2.1 mg/dL (ref 1.7–2.4)

## 2021-10-30 MED ORDER — ACETAMINOPHEN 10 MG/ML IV SOLN
1000.0000 mg | Freq: Four times a day (QID) | INTRAVENOUS | Status: AC
  Administered 2021-10-30 – 2021-10-31 (×4): 1000 mg via INTRAVENOUS
  Filled 2021-10-30 (×4): qty 100

## 2021-10-30 MED ORDER — TRAVASOL 10 % IV SOLN
INTRAVENOUS | Status: AC
  Filled 2021-10-30: qty 1123.2

## 2021-10-30 NOTE — Progress Notes (Signed)
PHARMACY - TOTAL PARENTERAL NUTRITION CONSULT NOTE   Indication: bowel obstruction  Patient Measurements: Height: '5\' 11"'$  (180.3 cm) Weight: 75.2 kg (165 lb 12.6 oz) IBW/kg (Calculated) : 75.3 TPN AdjBW (KG): 68.4 Body mass index is 23.12 kg/m.   Assessment:  66 yo male with hx HTN, HLD, prostate cancer s/p LUTS. S/p robotic assisted laparoscopic radical prostatectomy, bilateral robotic assisted laparoscopic pelvic lymphadenectomy, LOA, oversew of small bowel serosal injuries on 10/04/21.  Patient on TPN 10/11/21-10/16/21. CT on 10/17/21 with SBO with transition slightly above umbilicus. Pharmacy consulted 10/18/21 to resume TPN for SBO. Post-op ileus.  Glucose / Insulin: No hx DM.  A1c 5. - q8h CBGs <180 - 4 units SSI required in past 24 hours Electrolytes: Na sl low 133), CoCa 9.9, all others WNL Renal: Scr WNL Hepatic: on 8/3, Alkphos and AST/ALT improving. Tbili WNL. Albumin low at <1.5 Trig: 86 Intake / Output:  --Intake: 2118m TPN/24h --Output: LBM 7/27 NG to LIWS -100 documented last 24h, thin bilious Abdominal drain: 1739mlast 24h UOP ~ 1450 L in 24 hrs MIVF: d/c'd GI Meds: IV PPI/24h, IV Reglan '10mg'$ /6hr GI Imaging: 7/16: CT abd pelvis without abscess or fluid collection but concern for small bowel obstruction. 7/18: AXR: Findings are compatible with interval worsening of reported adynamic ileus 7/19: AXR 8hr delay: Findings compatible with un underlying partial small bowel obstruction. Oral contrast has reached the ascending colon >> 24hr delay shows persistent small bowel dilation measuring up to 4.8 cm.  Oral contrast throughout the colon. 7/23 CT abd pelvis with high-grade partial small bowel obstruction with marked dilation of proximal small bowel loops.  7/28 AXR: Gaseous distention of loops of bowel in the left hemiabdomen may reflect ileus or persistent obstruction 7/31: Abd Xray: improving ileus 8/1 CT: recurrent small bowel obstruction versus postoperative ileus.  Large peripherally enhancing fluid collection.Cannot exclude underlying abscess. Smaller fluid collection is noted within the posterior pelvis  GI Surgeries / Procedures:  7/10: surgery as per HPI 7/26: exploratory laparotomy with lysis of adhesions, small bowel resection, repair of small bowel serosa, repair of ascending colon serosa  8/2: IR image guided aspiration/drainage of ventral abdominal fluid collection-[id number] access: PICC 7/17 TPN start date: 7/17-7/22, resumed 7/24  Nutritional Goals:  Goal TPN rate is 90 mL/hr (provides 112 g of protein and 2198 kcals per day)  RD Assessment:  Estimated Needs Total Energy Estimated Needs: 2100-2300 kcal Total Protein Estimated Needs: 105-120 grams Total Fluid Estimated Needs: >/= 2.3 L/day  Current Nutrition:  NPO - NGT remains to LIWS TPN  Plan:   No change to TPN formula today. TPN to goal 90 ml/hr  (112g AA, 64.8g lipids (29.4%), 324g dextrose (GIR 2.99), 2198 kcal) meeting 100% of patients needs  Electrolytes in TPN:  Na 70 mEq/L K 50 mEq/L  Ca 3 mEq/L  Mg 7 mEq/L Phos 18 mmol/L Cl:Ac Max acetate Add standard MVI and trace elements to TPN Continue Sensitive q8h SSI and adjust as needed  Monitor TPN labs on Mon/Thurs and PRN   WilEmiliano DyerharmD, [phone]/2023 8:06 AM

## 2021-10-30 NOTE — Progress Notes (Signed)
Patient ID: Ivan Lopez, male   DOB: 08-Dec-1955, 66 y.o.   MRN: [id number]  10 Days Post-Op Subjective: He is voiding well into the [place].  He remains weak and has not recovered bowel function.  Drain continues to have output of about 21m/24.   Objective: Vital signs in last 24 hours: Temp:  [98.5 F (36.9 C)-98.8 F (37.1 C)] 98.5 F (36.9 C) (08/05 0519) Pulse Rate:  [95-109] 95 (08/05 0519) Resp:  [18-20] 18 (08/05 0519) BP: (126-143)/(73-81) 136/73 (08/05 0519) SpO2:  [98 %-100 %] 99 % (08/05 0519)  Intake/Output from previous day: 08/04 0701 - 08/05 0700 In: 2330.9 [I.V.:1670.4; IV Piggyback:660.4] Out: 1725 [Urine:1450; Emesis/NG output:100; Drains:175] Intake/Output this shift: No intake/output data recorded.  Physical Exam:  General: Alert and oriented Abdomen: Soft, no bowel sounds, drain with serous drainage Incisions: Dressing in place Ext: NT, No erythema  Lab Results: Recent Labs    10/29/21 0330  HGB 8.6*  HCT 26.4*      Latest Ref Rng & Units 10/29/2021    3:30 AM 10/27/2021    4:17 AM 10/26/2021    4:54 AM  CBC  WBC 4.0 - 10.5 K/uL 21.4  18.4  21.3   Hemoglobin 13.0 - 17.0 g/dL 8.6  9.6  9.3   Hematocrit 39.0 - 52.0 % 26.4  30.3  28.4   Platelets 150 - 400 K/uL 616  592  562      BMET Recent Labs    10/28/21 0445 10/30/21 0250  NA 135 133*  K 4.2 4.0  CL 104 100  CO2 24 26  GLUCOSE 143* 132*  BUN 20 24*  CREATININE 0.87 0.88  CALCIUM 7.7* 7.9*     Studies/Results: No results found.  Assessment/Plan: POD # 26 s/p robotic prostatectomy and POD # 10 s/p exploratory laparotomy, adhesiolysis, small bowel resection by General Surgery - Clinical management per General Surgery.  Will continue to monitor.  DVT prophylaxis.  Drain Cr was 0.6 on 8/2.    LOS: 25 days   JIrine Seal8/07/2021, 8:37 AM   Patient ID: Ivan Lopez male   DOB: 6Dec 29, 1957 66y.o.   MRN: [id number]

## 2021-10-30 NOTE — Progress Notes (Signed)
Referring Physician(s): Stechschulte,P  Supervising Physician: Ruthann Cancer  Patient Status:  [facility] - In-pt  Chief Complaint:  Abdominal pain/post op abscess  Subjective: Patient doing fair today; states he feels better since abdominal drain placed; sitting up in chair; NG remains in place; no BM   Allergies: Patient has no known allergies.  Medications: Prior to Admission medications   Medication Sig Start Date End Date Taking? Authorizing Provider  atorvastatin (LIPITOR) 80 MG tablet Take 80 mg by mouth at bedtime. 08/19/21  Yes [provider]  docusate sodium (COLACE) 100 MG capsule Take 1 capsule (100 mg total) by mouth 2 (two) times daily. 10/04/21  Yes Dancy, Estill Bamberg, PA-C  finasteride (PROSCAR) 5 MG tablet Take 5 mg by mouth daily. 09/05/21  Yes [provider]  sulfamethoxazole-trimethoprim (BACTRIM DS) 800-160 MG tablet Take 1 tablet by mouth 2 (two) times daily. Start the day prior to foley removal appointment 10/04/21  Yes Dancy, Estill Bamberg, PA-C  tamsulosin (FLOMAX) 0.4 MG CAPS capsule Take 0.4 mg by mouth at bedtime. 08/19/21  Yes [provider]  traMADol (ULTRAM) 50 MG tablet Take 1-2 tablets (50-100 mg total) by mouth every 6 (six) hours as needed for moderate pain or severe pain. 10/04/21  Yes Dancy, Estill Bamberg, PA-C     Vital Signs: BP 136/73 (BP Location: Left Arm)   Pulse 95   Temp 98.5 F (36.9 C)   Resp 18   Ht '5\' 11"'$  (1.803 m)   Wt 165 lb 12.6 oz (75.2 kg)   SpO2 99%   BMI 23.12 kg/m   Physical Exam awake, alert.  Abdominal drain intact, output 175 cc of thick, turbid beige-colored fluid; insertion site okay, mildly tender.  Imaging: CT IMAGE GUIDED DRAINAGE PERCUT CATH  PERITONEAL RETROPERIT  Result Date: 10/27/2021 INDICATION: 66 year old male referred for abdominal fluid drainage EXAM: CT-GUIDED DRAINAGE OF ABDOMINAL FLUID COLLECTION TECHNIQUE: Multidetector CT imaging of the abdomen was performed following the standard  protocol without IV contrast. RADIATION DOSE REDUCTION: This exam was performed according to the departmental dose-optimization program which includes automated exposure control, adjustment of the mA and/or kV according to patient size and/or use of iterative reconstruction technique. MEDICATIONS: The patient is currently admitted to the hospital and receiving intravenous antibiotics. The antibiotics were administered within an appropriate time frame prior to the initiation of the procedure. ANESTHESIA/SEDATION: Moderate (conscious) sedation was employed during this procedure. A total of Versed 1.0 mg and Fentanyl 50 mcg was administered intravenously by the radiology nurse. Total intra-service moderate Sedation Time: 13 minutes. The patient's level of consciousness and vital signs were monitored continuously by radiology nursing throughout the procedure under my direct supervision. COMPLICATIONS: None PROCEDURE: Informed written consent was obtained from the patient after a thorough discussion of the procedural risks, benefits and alternatives. All questions were addressed. Maximal Sterile Barrier Technique was utilized including caps, mask, sterile gowns, sterile gloves, sterile drape, hand hygiene and skin antiseptic. A timeout was performed prior to the initiation of the procedure. Patient positioned supine position on the CT gantry table. Scout CT was performed for planning purposes. The patient was then prepped and draped in the usual sterile fashion. 1% lidocaine was used for local anesthesia. Using CT guidance, Yueh needle was advanced into the fluid collection in the anterior abdomen from a right lower abdominal approach. Once we confirmed needle tip position, modified Seldinger technique was used to place a 12 [place] drain. Approximately 520 cc of murky thin fluid aspirated. Sample sent for culture. Gravity  bag was attached and the drain was sutured in position. Final image was acquired. Patient tolerated  the procedure well and remained hemodynamically stable throughout. No complications were encountered and no significant blood loss. IMPRESSION: Status post CT-guided drainage of anterior abdominal fluid collection. Signed, Dulcy Fanny. Nadene Rubins, RPVI [facility] Electronically Signed   By: Corrie Mckusick D.O.   On: 10/27/2021 11:38   CT ABDOMEN PELVIS W CONTRAST  Result Date: 10/26/2021 CLINICAL DATA:  Postop abdominal pain status post exploratory laparotomy, lysis of adhesions, and small-bowel resection. EXAM: CT ABDOMEN AND PELVIS WITH CONTRAST TECHNIQUE: Multidetector CT imaging of the abdomen and pelvis was performed using the standard protocol following bolus administration of intravenous contrast. RADIATION DOSE REDUCTION: This exam was performed according to the departmental dose-optimization program which includes automated exposure control, adjustment of the mA and/or kV according to patient size and/or use of iterative reconstruction technique. CONTRAST:  119m OMNIPAQUE IOHEXOL 300 MG/ML  SOLN COMPARISON:  10/17/2021 FINDINGS: Lower chest: There are small bilateral pleural effusions, left greater than right. Bilateral lower lobe atelectasis. Hepatobiliary: No suspicious liver lesion. Cyst within central right lobe of liver measures 2.7 cm, image 22/2. Tiny stones identified within the dependent portion of the gallbladder, image 31/2. Pancreas: Unremarkable. No pancreatic ductal dilatation or surrounding inflammatory changes. Spleen: Normal in size without focal abnormality. Adrenals/Urinary Tract: Normal adrenal glands. No kidney mass or hydronephrosis identified. Partially decompressed urinary bladder is grossly unremarkable. Stomach/Bowel: There is a nasogastric tube with tip terminating in the gastric fundus. Signs of recent small bowel resection identified with anastomotic suture chain identified in the left lower quadrant of the  abdomen, image 60/2. The proximal small bowel loops appear increased in caliber measuring up to 3.9 cm. Transition to decreased caliber small bowel noted within the left lower quadrant of the abdomen, image 37/7 and image 70/2. Distal small bowel loops appear decompressed. No pathologic dilatation of the colon. Vascular/Lymphatic: Aortic atherosclerosis without aneurysm. No signs of abdominopelvic adenopathy. Reproductive: Prostate gland is not visualized and may be surgically absent. Other: There is a large peripherally enhancing fluid collection within the ventral abdomen exhibiting mass effect and displacing the bowel loops posteriorly and laterally. This measures 17.4 x 8.5 by 23.1 cm (volume = 1800 cm^3), image 64/8 and image 55/2. Scattered locules of gas are identified within this fluid collection. Smaller fluid collection is noted within the posterior pelvis measuring 5.3 by 3.2 by 2.8 cm (volume = 25 cm^3), image 88/2. Moderate pneumoperitoneum is identified. Musculoskeletal: No acute or significant osseous findings. IMPRESSION: 1. Signs of recent small bowel resection with anastomotic suture chain in the left lower quadrant of the abdomen. There is abnormal dilatation of the proximal and mid small bowel loops with transition to decreased caliber distal small bowel. Differential excludes recurrent small bowel obstruction versus postoperative ileus. 2. Large peripherally enhancing fluid collection within the ventral abdomen exhibiting mass effect and displacing the bowel loops posteriorly and laterally. Cannot exclude underlying abscess. 3. Smaller fluid collection is noted within the posterior pelvis measuring up to 5.3 cm. 4. Moderate pneumoperitoneum. 5. Small bilateral pleural effusions, left greater than right. 6. Cholelithiasis. 7. Aortic Atherosclerosis (ICD10-I70.0). Electronically Signed   By: TKerby MoorsM.D.   On: 10/26/2021 14:27    Labs:  CBC: Recent Labs    10/24/21 0355  10/26/21 0454 10/27/21 0417 10/29/21 0330  WBC 16.0* 21.3* 18.4* 21.4*  HGB 8.8* 9.3* 9.6* 8.6*  HCT 27.6* 28.4* 30.3* 26.4*  PLT 470* 562* 592* 616*    COAGS: Recent Labs    10/27/21 0417  INR 1.1    BMP: Recent Labs    10/25/21 0335 10/26/21 0454 10/28/21 0445 10/30/21 0250  NA 136 137 135 133*  K 3.4* 3.7 4.2 4.0  CL 109 108 104 100  CO2 19* 21* 24 26  GLUCOSE 136* 146* 143* 132*  BUN '18 18 20 '$ 24*  CALCIUM 7.7* 8.1* 7.7* 7.9*  CREATININE 0.80 0.82 0.87 0.88  GFRNONAA >60 >60 >60 >60    LIVER FUNCTION TESTS: Recent Labs    10/20/21 0213 10/21/21 0337 10/25/21 0335 10/28/21 0445  BILITOT 0.4 0.4 0.5 0.3  AST 27 20 99* 20  ALT 153* 97* 153* 81*  ALKPHOS 114 96 237* 182*  PROT 6.0* 5.7* 5.5* 5.7*  ALBUMIN 2.1* 1.9* <1.5* <1.5*    Assessment and Plan: Patient with history of robotic prostatectomy and laparoscopic lysis of adhesions with oversew of small bowel serosal injury on 7/10 and exploratory lap/lysis of adhesions/small bowel resection 7/26; status post drainage of postop ant abd abscess on 8/2; afebrile, creatinine normal, drain fluid cultures with Clostridium cadaveris, drain fluid creatinine 0.6   Drain Location: ant mid abd Size: Fr size: 12 Fr Date of placement: 10/27/21  Currently to: Drain collection device: gravity 24 hour output:  Output by Drain (mL) 10/28/21 0701 - 10/28/21 1900 10/28/21 1901 - 10/29/21 0700 10/29/21 0701 - 10/29/21 1900 10/29/21 1901 - 10/30/21 0700 10/30/21 0701 - 10/30/21 1055  Closed System Drain 1 Inferior Abdomen 12 Fr. 200  150 25       Current examination: Flushes/aspirates easily.  Insertion site unremarkable. Suture in  place. Dressed appropriately.   Plan: Continue TID flushes with 5 cc NS. Record output Q shift. Dressing changes QD or PRN if soiled.  Call IR APP or on call IR MD if difficulty flushing or sudden change in drain output.  Repeat imaging/possible drain injection once output < 10 mL/QD  (excluding flush material). Consideration for drain removal if output is < 10 mL/QD (excluding flush material), pending discussion with the providing surgical service.  Discharge planning: Please contact IR APP or on call IR MD prior to patient d/c to ensure appropriate follow up plans are in place. Typically patient will follow up with IR clinic 10-14 days post d/c for repeat imaging/possible drain injection. IR scheduler will contact patient with date/time of appointment. Patient will need to flush drain QD with 5 cc NS, record output QD, dressing changes every 2-3 days or earlier if soiled.   IR will continue to follow - please call with questions or concerns.      Electronically Signed: D. Rowe Robert, PA-C 10/30/2021, 10:49 AM   I spent a total of 15 Minutes at the the patient's bedside AND on the patient's hospital floor or unit, greater than 50% of which was counseling/coordinating care for anterior abdominal abscess drain    Patient ID: Ivan Lopez, male   DOB: 10-Mar-1956, 66 y.o.   MRN: [id number]

## 2021-10-30 NOTE — Progress Notes (Signed)
Progress Note  10 Days Post-Op  Subjective: No flatus or BM. IR drain a little darker/thicker today  Objective: Vital signs in last 24 hours: Temp:  [98.5 F (36.9 C)-98.8 F (37.1 C)] 98.5 F (36.9 C) (08/05 0519) Pulse Rate:  [95-109] 95 (08/05 0519) Resp:  [18-20] 18 (08/05 0519) BP: (126-143)/(73-81) 136/73 (08/05 0519) SpO2:  [98 %-100 %] 99 % (08/05 0519) Last BM Date : 10/21/21  Intake/Output from previous day: 08/04 0701 - 08/05 0700 In: 2330.9 [I.V.:1670.4; IV Piggyback:660.4] Out: 1725 [Urine:1450; Emesis/NG output:100; Drains:175] Intake/Output this shift: No intake/output data recorded.  PE: Gen:  Alert, NAD, pleasant Pulm:  Normal effort Abd: abdomen distended but soft, appropriately ttp, incision C/D/I with staples present, NGT with thin bilious drainage, BS hypoactive, Drain with slighly brown thin fluid  Skin: warm and dry, no rashes  Psych: A&Ox3    Lab Results:  Recent Labs    10/29/21 0330  WBC 21.4*  HGB 8.6*  HCT 26.4*  PLT 616*    BMET Recent Labs    10/28/21 0445 10/30/21 0250  NA 135 133*  K 4.2 4.0  CL 104 100  CO2 24 26  GLUCOSE 143* 132*  BUN 20 24*  CREATININE 0.87 0.88  CALCIUM 7.7* 7.9*    PT/INR No results for input(s): "LABPROT", "INR" in the last 72 hours.  CMP     Component Value Date/Time   NA 133 (L) 10/30/2021 0250   K 4.0 10/30/2021 0250   CL 100 10/30/2021 0250   CO2 26 10/30/2021 0250   GLUCOSE 132 (H) 10/30/2021 0250   BUN 24 (H) 10/30/2021 0250   CREATININE 0.88 10/30/2021 0250   CALCIUM 7.9 (L) 10/30/2021 0250   PROT 5.7 (L) 10/28/2021 0445   ALBUMIN <1.5 (L) 10/28/2021 0445   AST 20 10/28/2021 0445   ALT 81 (H) 10/28/2021 0445   ALKPHOS 182 (H) 10/28/2021 0445   BILITOT 0.3 10/28/2021 0445   GFRNONAA >60 10/30/2021 0250   Lipase  No results found for: "LIPASE"     Studies/Results: No results found.  Anti-infectives: Anti-infectives (From admission, onward)    Start     Dose/Rate  Route Frequency Ordered Stop   10/28/21 1000  piperacillin-tazobactam (ZOSYN) IVPB 3.375 g        3.375 g 12.5 mL/hr over 240 Minutes Intravenous Every 8 hours 10/28/21 0904     10/20/21 1000  cefoTEtan (CEFOTAN) 2 g in sodium chloride 0.9 % 100 mL IVPB        2 g 200 mL/hr over 30 Minutes Intravenous  Once 10/19/21 1309 10/20/21 1018   10/20/21 0946  sodium chloride 0.9 % with cefoTEtan (CEFOTAN) ADS Med       Note to Pharmacy: Karsten Ro: cabinet override      10/20/21 0946 10/20/21 0949   10/14/21 1430  sulfamethoxazole-trimethoprim (BACTRIM DS) 800-160 MG per tablet 1 tablet        1 tablet Oral Every 12 hours 10/14/21 1332 10/16/21 2152   10/04/21 2330  ceFAZolin (ANCEF) IVPB 1 g/50 mL premix        1 g 100 mL/hr over 30 Minutes Intravenous Every 8 hours 10/04/21 1737 10/05/21 1014   10/04/21 0922  ceFAZolin (ANCEF) IVPB 2g/100 mL premix        2 g 200 mL/hr over 30 Minutes Intravenous 30 min pre-op 10/04/21 0922 10/04/21 1530   10/04/21 0000  sulfamethoxazole-trimethoprim (BACTRIM DS) 800-160 MG tablet        1  tablet Oral 2 times daily 10/04/21 1048          Assessment/Plan POD#26 - robotic assisted laparoscopic prostatectomy Dr. Alinda Money, and laparoscopic LOA with oversew of small bowel serosal injuries x2 Dr. Brantley Stage 7/10 POD#10 - exploratory laparotomy with LOA and small bowel resection Dr. Dema Severin 7/26 - await resolution of post op ileus - continue NGT to LIWS and TPN  - CT 8/1 with large intra-abdominal fluid collection - s/p IR drain placement 8/2, Cxs with clostridium cadaveris, continue Zosyn,  - continue IV tylenol and IV robaxin, DC PCA and use IV pushes q2h prn if needed - continue to encourage mobilization  - consider repeat imaging early next week with enteral contrast if still not opening up to evaluate for fistulous connection to drain     FEN: NPO, TPN, NGT to LIWS VTE: SQH ID: cefotetan 7/26; Zosyn 8/3>> Foley: out 7/27 and voiding   LOS: 25 days     Ivan Morn, MD [facility] Surgery 10/30/2021, 10:22 AM Please see Amion for pager number during day hours 7:00am-4:30pm

## 2021-10-31 LAB — CBC
HCT: 25.4 % — ABNORMAL LOW (ref 39.0–52.0)
Hemoglobin: 8.2 g/dL — ABNORMAL LOW (ref 13.0–17.0)
MCH: 28.6 pg (ref 26.0–34.0)
MCHC: 32.3 g/dL (ref 30.0–36.0)
MCV: 88.5 fL (ref 80.0–100.0)
Platelets: 658 10*3/uL — ABNORMAL HIGH (ref 150–400)
RBC: 2.87 MIL/uL — ABNORMAL LOW (ref 4.22–5.81)
RDW: 16.2 % — ABNORMAL HIGH (ref 11.5–15.5)
WBC: 21.6 10*3/uL — ABNORMAL HIGH (ref 4.0–10.5)
nRBC: 0.2 % (ref 0.0–0.2)

## 2021-10-31 LAB — GLUCOSE, CAPILLARY
Glucose-Capillary: 124 mg/dL — ABNORMAL HIGH (ref 70–99)
Glucose-Capillary: 147 mg/dL — ABNORMAL HIGH (ref 70–99)
Glucose-Capillary: 125 mg/dL — ABNORMAL HIGH (ref 70–99)

## 2021-10-31 MED ORDER — TRAVASOL 10 % IV SOLN
INTRAVENOUS | Status: AC
  Filled 2021-10-31: qty 1123.2

## 2021-10-31 MED ORDER — ACETAMINOPHEN 10 MG/ML IV SOLN
1000.0000 mg | Freq: Four times a day (QID) | INTRAVENOUS | Status: AC
Start: 2021-10-31 — End: 2021-11-01
  Administered 2021-10-31 – 2021-11-01 (×4): 1000 mg via INTRAVENOUS
  Filled 2021-10-31 (×4): qty 100

## 2021-10-31 NOTE — Progress Notes (Signed)
PHARMACY - TOTAL PARENTERAL NUTRITION CONSULT NOTE   Indication: bowel obstruction  Patient Measurements: Height: '5\' 11"'$  (180.3 cm) Weight: 75.2 kg (165 lb 12.6 oz) IBW/kg (Calculated) : 75.3 TPN AdjBW (KG): 68.4 Body mass index is 23.12 kg/m.   Assessment:  66 yo male with hx HTN, HLD, prostate cancer s/p LUTS. S/p robotic assisted laparoscopic radical prostatectomy, bilateral robotic assisted laparoscopic pelvic lymphadenectomy, LOA, oversew of small bowel serosal injuries on 10/04/21.  Patient on TPN 10/11/21-10/16/21. CT on 10/17/21 with SBO with transition slightly above umbilicus. Pharmacy consulted 10/18/21 to resume TPN for SBO. Post-op ileus.  Glucose / Insulin: No hx DM.  A1c 5. - q8h CBGs at goal for 100-150 - 4 units SSI required in past 24 hours Electrolytes: Labs 8/5: (Na sl low 133), CoCa 9.9, all others WNL Renal: Scr WNL Hepatic: on 8/3, Alkphos and AST/ALT improving. Tbili WNL. Albumin low at <1.5 Trig: 86 Intake / Output:  --Intake: 2127m TPN/24h --Output: LBM 7/27 NG to LIWS -575 documented last 24h (increased), thin bilious Abdominal drain: 965mlast 24h (down) UOP ~ 2600 L in 24 hrs MIVF: d/c'd GI Meds: IV PPI/24h, IV Reglan '10mg'$ /6hr GI Imaging: 7/16: CT abd pelvis without abscess or fluid collection but concern for small bowel obstruction. 7/18: AXR: Findings are compatible with interval worsening of reported adynamic ileus 7/19: AXR 8hr delay: Findings compatible with un underlying partial small bowel obstruction. Oral contrast has reached the ascending colon >> 24hr delay shows persistent small bowel dilation measuring up to 4.8 cm.  Oral contrast throughout the colon. 7/23 CT abd pelvis with high-grade partial small bowel obstruction with marked dilation of proximal small bowel loops.  7/28 AXR: Gaseous distention of loops of bowel in the left hemiabdomen may reflect ileus or persistent obstruction 7/31: Abd Xray: improving ileus 8/1 CT: recurrent small  bowel obstruction versus postoperative ileus. Large peripherally enhancing fluid collection.Cannot exclude underlying abscess. Smaller fluid collection is noted within the posterior pelvis  GI Surgeries / Procedures:  7/10: surgery as per HPI 7/26: exploratory laparotomy with lysis of adhesions, small bowel resection, repair of small bowel serosa, repair of ascending colon serosa  8/2: IR image guided aspiration/drainage of ventral abdominal fluid collection-[id number] access: PICC 7/17 TPN start date: 7/17-7/22, resumed 7/24  Nutritional Goals:  Goal TPN rate is 90 mL/hr (provides 112 g of protein and 2198 kcals per day)  RD Assessment:  Estimated Needs Total Energy Estimated Needs: 2100-2300 kcal Total Protein Estimated Needs: 105-120 grams Total Fluid Estimated Needs: >/= 2.3 L/day  Current Nutrition:  NPO - NGT remains to LIWS TPN  Plan:   No change to TPN formula today. TPN to goal 90 ml/hr  (112g AA, 64.8g lipids (29.4%), 324g dextrose (GIR 2.99), 2198 kcal) meeting 100% of patients needs  Electrolytes in TPN:  Na 70 mEq/L K 50 mEq/L  Ca 3 mEq/L  Mg 7 mEq/L Phos 18 mmol/L Cl:Ac Max acetate Add standard MVI and trace elements to TPN Continue Sensitive q8h SSI and adjust as needed  Monitor TPN labs on Mon/Thurs and PRN   EriPeggyann JubaharmD, BCPS Pharmacy: [id number]/2023 8:19 AM

## 2021-10-31 NOTE — Progress Notes (Signed)
Patient ID: Ivan Lopez, male   DOB: [birth year], 66 y.o.   MRN: [id number]  11 Days Post-Op Subjective: He is voiding well into the [place].  He remains weak and has not recovered bowel function.  Drain continues to have output but is decreased to 84m/24.   Objective: Vital signs in last 24 hours: Temp:  [98.5 F (36.9 C)-99.1 F (37.3 C)] 99.1 F (37.3 C) (08/06 0532) Pulse Rate:  [96-105] 96 (08/06 0532) Resp:  [18-20] 20 (08/06 0532) BP: (121-124)/(70-78) 124/70 (08/06 0532) SpO2:  [99 %-100 %] 99 % (08/06 0532)  Intake/Output from previous day: 08/05 0701 - 08/06 0700 In: 3179.9 [I.V.:2211.9; IV Piggyback:948] Out: 33546[Urine:2600; Emesis/NG output:575; Drains:90] Intake/Output this shift: No intake/output data recorded.  Physical Exam:  General: Alert and oriented   Lab Results: Recent Labs    10/29/21 0330 10/31/21 0323  HGB 8.6* 8.2*  HCT 26.4* 25.4*      Latest Ref Rng & Units 10/31/2021    3:23 AM 10/29/2021    3:30 AM 10/27/2021    4:17 AM  CBC  WBC 4.0 - 10.5 K/uL 21.6  21.4  18.4   Hemoglobin 13.0 - 17.0 g/dL 8.2  8.6  9.6   Hematocrit 39.0 - 52.0 % 25.4  26.4  30.3   Platelets 150 - 400 K/uL 658  616  592      BMET Recent Labs    10/30/21 0250  NA 133*  K 4.0  CL 100  CO2 26  GLUCOSE 132*  BUN 24*  CREATININE 0.88  CALCIUM 7.9*     Studies/Results: No results found.  Assessment/Plan: POD # 27 s/p robotic prostatectomy and POD # 11 s/p exploratory laparotomy, adhesiolysis, small bowel resection by General Surgery - Clinical management per General Surgery.  Will continue to monitor.  DVT prophylaxis.  Drain Cr was 0.6 on 8/2.    LOS: 26 days   Ivan Seal8/08/2021, 9:23 AM   Patient ID: Ivan Lopez male   DOB: [birth year] 66y.o.   MRN: [id number] ID: Ivan Lopez male   DOB: [birth year] 66y.o.   MRN: [id number]

## 2021-10-31 NOTE — Progress Notes (Signed)
Progress Note  11 Days Post-Op  Subjective: No flatus or BM. IR drain a little darker/thicker still today  Objective: Vital signs in last 24 hours: Temp:  [98.5 F (36.9 C)-99.1 F (37.3 C)] 99.1 F (37.3 C) (08/06 0532) Pulse Rate:  [96-105] 96 (08/06 0532) Resp:  [18-20] 20 (08/06 0532) BP: (121-124)/(70-78) 124/70 (08/06 0532) SpO2:  [99 %-100 %] 99 % (08/06 0532) Last BM Date : 10/04/21  Intake/Output from previous day: 08/05 0701 - 08/06 0700 In: 3179.9 [I.V.:2211.9; IV Piggyback:948] Out: 5852 [Urine:2600; Emesis/NG output:575; Drains:90] Intake/Output this shift: No intake/output data recorded.  PE: Gen:  Alert, NAD, pleasant Pulm:  Normal effort Abd: abdomen distended but soft, appropriately ttp, incision C/D/I with staples present, NGT with thin bilious drainage, BS hypoactive, Drain with slighly brown thin fluid and some purulent fluid Skin: warm and dry, no rashes  Psych: A&Ox3    Lab Results:  Recent Labs    10/29/21 0330 10/31/21 0323  WBC 21.4* 21.6*  HGB 8.6* 8.2*  HCT 26.4* 25.4*  PLT 616* 658*    BMET Recent Labs    10/30/21 0250  NA 133*  K 4.0  CL 100  CO2 26  GLUCOSE 132*  BUN 24*  CREATININE 0.88  CALCIUM 7.9*    PT/INR No results for input(s): "LABPROT", "INR" in the last 72 hours.  CMP     Component Value Date/Time   NA 133 (L) 10/30/2021 0250   K 4.0 10/30/2021 0250   CL 100 10/30/2021 0250   CO2 26 10/30/2021 0250   GLUCOSE 132 (H) 10/30/2021 0250   BUN 24 (H) 10/30/2021 0250   CREATININE 0.88 10/30/2021 0250   CALCIUM 7.9 (L) 10/30/2021 0250   PROT 5.7 (L) 10/28/2021 0445   ALBUMIN <1.5 (L) 10/28/2021 0445   AST 20 10/28/2021 0445   ALT 81 (H) 10/28/2021 0445   ALKPHOS 182 (H) 10/28/2021 0445   BILITOT 0.3 10/28/2021 0445   GFRNONAA >60 10/30/2021 0250   Lipase  No results found for: "LIPASE"     Studies/Results: No results found.  Anti-infectives: Anti-infectives (From admission, onward)     Start     Dose/Rate Route Frequency Ordered Stop   10/28/21 1000  piperacillin-tazobactam (ZOSYN) IVPB 3.375 g        3.375 g 12.5 mL/hr over 240 Minutes Intravenous Every 8 hours 10/28/21 0904     10/20/21 1000  cefoTEtan (CEFOTAN) 2 g in sodium chloride 0.9 % 100 mL IVPB        2 g 200 mL/hr over 30 Minutes Intravenous  Once 10/19/21 1309 10/20/21 1018   10/20/21 0946  sodium chloride 0.9 % with cefoTEtan (CEFOTAN) ADS Med       Note to Pharmacy: Karsten Ro: cabinet override      10/20/21 0946 10/20/21 0949   10/14/21 1430  sulfamethoxazole-trimethoprim (BACTRIM DS) 800-160 MG per tablet 1 tablet        1 tablet Oral Every 12 hours 10/14/21 1332 10/16/21 2152   10/04/21 2330  ceFAZolin (ANCEF) IVPB 1 g/50 mL premix        1 g 100 mL/hr over 30 Minutes Intravenous Every 8 hours 10/04/21 1737 10/05/21 1014   10/04/21 0922  ceFAZolin (ANCEF) IVPB 2g/100 mL premix        2 g 200 mL/hr over 30 Minutes Intravenous 30 min pre-op 10/04/21 0922 10/04/21 1530   10/04/21 0000  sulfamethoxazole-trimethoprim (BACTRIM DS) 800-160 MG tablet        1  tablet Oral 2 times daily 10/04/21 1048          Assessment/Plan POD#27 - robotic assisted laparoscopic prostatectomy Dr. Alinda Money, and laparoscopic LOA with oversew of small bowel serosal injuries x2 Dr. Brantley Stage 7/10 POD#11 - exploratory laparotomy with LOA and small bowel resection Dr. Dema Severin 7/26 - await resolution of post op ileus - continue NGT to LIWS and TPN  - CT 8/1 with large intra-abdominal fluid collection - s/p IR drain placement 8/2, Cxs with clostridium cadaveris, continue Zosyn,  - patient reports fever last night but I do not see it recorded. - continue IV tylenol and IV robaxin, DC PCA and use IV pushes q2h prn if needed - continue to encourage mobilization  - consider repeat imaging early next week with enteral contrast if still not opening up to evaluate for fistulous connection to drain     FEN: NPO, TPN, NGT to LIWS VTE:  SQH ID: cefotetan 7/26; Zosyn 8/3>> Foley: out 7/27 and voiding   LOS: 26 days    Ivan Morn, MD [facility] Surgery 10/31/2021, 8:28 AM Please see Amion for pager number during day hours 7:00am-4:30pm

## 2021-10-31 NOTE — TOC Progression Note (Signed)
Transition of Care [facility]) - Progression Note    Patient Details  Name: Ivan Lopez MRN: [id number] Date of Birth: 16-Jan-1956  Transition of Care [facility]) CM/SW Contact  [name], [name], RN Phone Number: 10/31/2021, 3:53 PM  Clinical Narrative: Noted NGT in place,no BM,abd RLQ drain for abscess in place. TPN continuous.[name] rep for [employer] following for TPN infusion if needed for home. [name](spouse) no preference for HHC agency-spoke to [facility] rep [name]-she will check in am if able to accept.    Expected Discharge Plan: [place] Barriers to Discharge: Continued Medical Work up  Expected Discharge Plan and Services Expected Discharge Plan: [place]   Discharge Planning Services: CM Consult   Living arrangements for the past 2 months: Single Family Home                                       Social Determinants of Health (SDOH) Interventions    Readmission Risk Interventions     No data to display

## 2021-11-01 ENCOUNTER — Encounter (HOSPITAL_COMMUNITY): Payer: Self-pay | Admitting: Urology

## 2021-11-01 ENCOUNTER — Inpatient Hospital Stay (HOSPITAL_COMMUNITY): Payer: Medicare Other

## 2021-11-01 LAB — CBC
HCT: 26.7 % — ABNORMAL LOW (ref 39.0–52.0)
Hemoglobin: 8.6 g/dL — ABNORMAL LOW (ref 13.0–17.0)
MCH: 28.7 pg (ref 26.0–34.0)
MCHC: 32.2 g/dL (ref 30.0–36.0)
MCV: 89 fL (ref 80.0–100.0)
Platelets: 717 10*3/uL — ABNORMAL HIGH (ref 150–400)
RBC: 3 MIL/uL — ABNORMAL LOW (ref 4.22–5.81)
RDW: 16.6 % — ABNORMAL HIGH (ref 11.5–15.5)
WBC: 22.9 10*3/uL — ABNORMAL HIGH (ref 4.0–10.5)
nRBC: 0.3 % — ABNORMAL HIGH (ref 0.0–0.2)

## 2021-11-01 LAB — GLUCOSE, CAPILLARY
Glucose-Capillary: 122 mg/dL — ABNORMAL HIGH (ref 70–99)
Glucose-Capillary: 134 mg/dL — ABNORMAL HIGH (ref 70–99)
Glucose-Capillary: 141 mg/dL — ABNORMAL HIGH (ref 70–99)
Glucose-Capillary: 156 mg/dL — ABNORMAL HIGH (ref 70–99)
Glucose-Capillary: 144 mg/dL — ABNORMAL HIGH (ref 70–99)

## 2021-11-01 LAB — COMPREHENSIVE METABOLIC PANEL
ALT: 74 U/L — ABNORMAL HIGH (ref 0–44)
AST: 39 U/L (ref 15–41)
Albumin: 1.7 g/dL — ABNORMAL LOW (ref 3.5–5.0)
Alkaline Phosphatase: 241 U/L — ABNORMAL HIGH (ref 38–126)
Anion gap: 10 (ref 5–15)
BUN: 21 mg/dL (ref 8–23)
CO2: 21 mmol/L — ABNORMAL LOW (ref 22–32)
Calcium: 8.2 mg/dL — ABNORMAL LOW (ref 8.9–10.3)
Chloride: 103 mmol/L (ref 98–111)
Creatinine, Ser: 0.99 mg/dL (ref 0.61–1.24)
GFR, Estimated: >60 mL/min (ref >60)
Glucose, Bld: 130 mg/dL — ABNORMAL HIGH (ref 70–99)
Potassium: 4 mmol/L (ref 3.5–5.1)
Sodium: 134 mmol/L — ABNORMAL LOW (ref 135–145)
Total Bilirubin: 0.6 mg/dL (ref 0.3–1.2)
Total Protein: 6.8 g/dL (ref 6.5–8.1)

## 2021-11-01 LAB — MAGNESIUM: Magnesium: 2.1 mg/dL (ref 1.7–2.4)

## 2021-11-01 LAB — TRIGLYCERIDES: Triglycerides: 93 mg/dL (ref <150)

## 2021-11-01 LAB — PHOSPHORUS: Phosphorus: 3.1 mg/dL (ref 2.5–4.6)

## 2021-11-01 MED ORDER — TRAVASOL 10 % IV SOLN
INTRAVENOUS | Status: AC
  Filled 2021-11-01: qty 1123.2

## 2021-11-01 MED ORDER — IOHEXOL 9 MG/ML PO SOLN
ORAL | Status: AC
  Administered 2021-11-01: 500 mL
  Filled 2021-11-01: qty 1000

## 2021-11-01 MED ORDER — IOHEXOL 300 MG/ML  SOLN
100.0000 mL | Freq: Once | INTRAMUSCULAR | Status: AC | PRN
  Administered 2021-11-01: 100 mL via INTRAVENOUS

## 2021-11-01 MED ORDER — SODIUM CHLORIDE (PF) 0.9 % IJ SOLN
INTRAMUSCULAR | Status: AC
  Filled 2021-11-01: qty 50

## 2021-11-01 MED ORDER — ACETAMINOPHEN 10 MG/ML IV SOLN
1000.0000 mg | Freq: Four times a day (QID) | INTRAVENOUS | Status: AC
Start: 2021-11-01 — End: 2021-11-02
  Administered 2021-11-01 – 2021-11-02 (×3): 1000 mg via INTRAVENOUS
  Filled 2021-11-01 (×3): qty 100

## 2021-11-01 MED ORDER — IOHEXOL 9 MG/ML PO SOLN
500.0000 mL | ORAL | Status: AC
  Administered 2021-11-01: 500 mL via ORAL

## 2021-11-01 MED ORDER — POLYVINYL ALCOHOL 1.4 % OP SOLN
1.0000 [drp] | OPHTHALMIC | Status: DC | PRN
  Administered 2021-11-01: 1 [drp] via OPHTHALMIC
  Filled 2021-11-01: qty 15

## 2021-11-01 NOTE — Progress Notes (Addendum)
Referring Physician(s): Stechschulte,P  Supervising Physician: Michaelle Birks  Patient Status:  [facility] - In-pt  Chief Complaint: Abdominal pain/post op abscess   Subjective: Pt sitting up in chair; wife in room; still has some lower abd discomfort; denies N/V; has passed gas and had BM   Allergies: Patient has no known allergies.  Medications: Prior to Admission medications   Medication Sig Start Date End Date Taking? Authorizing Provider  atorvastatin (LIPITOR) 80 MG tablet Take 80 mg by mouth at bedtime. 08/19/21  Yes [provider]  docusate sodium (COLACE) 100 MG capsule Take 1 capsule (100 mg total) by mouth 2 (two) times daily. 10/04/21  Yes Dancy, Estill Bamberg, PA-C  finasteride (PROSCAR) 5 MG tablet Take 5 mg by mouth daily. 09/05/21  Yes [provider]  sulfamethoxazole-trimethoprim (BACTRIM DS) 800-160 MG tablet Take 1 tablet by mouth 2 (two) times daily. Start the day prior to foley removal appointment 10/04/21  Yes Dancy, Estill Bamberg, PA-C  tamsulosin (FLOMAX) 0.4 MG CAPS capsule Take 0.4 mg by mouth at bedtime. 08/19/21  Yes [provider]  traMADol (ULTRAM) 50 MG tablet Take 1-2 tablets (50-100 mg total) by mouth every 6 (six) hours as needed for moderate pain or severe pain. 10/04/21  Yes Dancy, Estill Bamberg, PA-C     Vital Signs: BP 138/86 (BP Location: Left Arm)   Pulse (!) 109   Temp 99.6 F (37.6 C) (Oral)   Resp 18   Ht '5\' 11"'$  (1.803 m)   Wt 165 lb 12.6 oz (75.2 kg)   SpO2 99%   BMI 23.12 kg/m   Physical Exam awake/alert; abd drain intact, OP 40 cc turbid, beige colored fluid; drain flushed by nursing without difficulty  Imaging: CT ABDOMEN PELVIS W CONTRAST  Result Date: 11/01/2021 CLINICAL DATA:  Status post laparoscopic prostatectomy 4 weeks ago and exploratory laparotomy with lysis of adhesions and small-bowel resection 12 days ago, complicated by intra-abdominal abscess requiring IR drain placement five days ago. Continued leukocytosis.  EXAM: CT ABDOMEN AND PELVIS WITH CONTRAST TECHNIQUE: Multidetector CT imaging of the abdomen and pelvis was performed using the standard protocol following bolus administration of intravenous contrast. RADIATION DOSE REDUCTION: This exam was performed according to the departmental dose-optimization program which includes automated exposure control, adjustment of the mA and/or kV according to patient size and/or use of iterative reconstruction technique. CONTRAST:  125m OMNIPAQUE IOHEXOL 300 MG/ML  SOLN COMPARISON:  CT abdomen pelvis dated October 26, 2021. FINDINGS: Lower chest: Essentially resolved right pleural effusion. Unchanged small left pleural effusion. Continued bilateral posterior lower lobe atelectasis. Hepatobiliary: Unchanged simple cyst in the central liver. No new focal liver abnormality. Unchanged tiny gallstones. No gallbladder wall thickening or biliary dilatation. Pancreas: Unremarkable. No pancreatic ductal dilatation or surrounding inflammatory changes. Spleen: Normal in size without focal abnormality. Adrenals/Urinary Tract: Adrenal glands are unremarkable. Kidneys are normal, without renal calculi, focal lesion, or hydronephrosis. Bladder is unremarkable. Stomach/Bowel: Multiple dilated loops of small bowel to the level of the anastomosis in the left lower abdomen, similar to prior study. Small bowel distal to the anastomosis is decompressed. Oral contrast reaches the rectum. The stomach is within normal limits. Vascular/Lymphatic: Aortic atherosclerosis. No enlarged abdominal or pelvic lymph nodes. Reproductive: Status post prostatectomy. Other: Interval percutaneous drain placement within the large rim enhancing anterior abdominal gas and fluid collection, which has decreased in size, currently measuring approximally 2.2 x 14.7 cm, previously 8.5 x 17.4 cm. New 8.8 x 4.9 cm rim enhancing fluid collection in the left lateral  abdomen (series 2, image 58), which appears to communicate with the  ventral abdominal fluid collection (series 2, image 55). Posterior pelvic fluid collection has also slightly decreased in size, currently measuring 1.8 x 4.9 cm (series 2, image 85), previously 3.2 x 5.3 cm. Small amount of loculated fluid under the left hemidiaphragm superior to the spleen appears slightly decreased in size compared to prior study. Decreased now trace pneumoperitoneum in the upper abdomen. Musculoskeletal: No acute or significant osseous findings. IMPRESSION: 1. Interval percutaneous drain placement within the large anterior intra-abdominal abscess, which has decreased in size, currently measuring approximally 2.2 x 14.7 cm. 2. New 8.8 x 4.9 cm abscess in the left lateral abdomen, which appears to communicate with the anterior abscess. 3. Posterior pelvic abscess has also slightly decreased in size, currently measuring 1.8 x 4.9 cm. 4. Multiple dilated loops of small bowel to the level of the anastomosis in the left lower abdomen, similar to prior study, consistent with ileus. Oral contrast reaches the rectum. 5. Essentially resolved right pleural effusion. Unchanged small left pleural effusion. 6. Aortic Atherosclerosis (ICD10-I70.0). Electronically Signed   By: Titus Dubin M.D.   On: 11/01/2021 14:55    Labs:  CBC: Recent Labs    10/27/21 0417 10/29/21 0330 10/31/21 0323 11/01/21 0318  WBC 18.4* 21.4* 21.6* 22.9*  HGB 9.6* 8.6* 8.2* 8.6*  HCT 30.3* 26.4* 25.4* 26.7*  PLT 592* 616* 658* 717*    COAGS: Recent Labs    10/27/21 0417  INR 1.1    BMP: Recent Labs    10/26/21 0454 10/28/21 0445 10/30/21 0250 11/01/21 0318  NA 137 135 133* 134*  K 3.7 4.2 4.0 4.0  CL 108 104 100 103  CO2 21* 24 26 21*  GLUCOSE 146* 143* 132* 130*  BUN 18 20 24* 21  CALCIUM 8.1* 7.7* 7.9* 8.2*  CREATININE 0.82 0.87 0.88 0.99  GFRNONAA >60 >60 >60 >60    LIVER FUNCTION TESTS: Recent Labs    10/21/21 0337 10/25/21 0335 10/28/21 0445 11/01/21 0318  BILITOT 0.4 0.5 0.3 0.6   AST 20 99* 20 39  ALT 97* 153* 81* 74*  ALKPHOS 96 237* 182* 241*  PROT 5.7* 5.5* 5.7* 6.8  ALBUMIN 1.9* <1.5* <1.5* 1.7*    Assessment and Plan: Patient with history of robotic prostatectomy and laparoscopic lysis of adhesions with oversew of small bowel serosal injury on 7/10 and exploratory lap/lysis of adhesions/small bowel resection 7/26; status post drainage of postop ant abd abscess on 8/2 (12 fr to bag); afebrile,WBC 22.9(21.6), hgb 8.6(8.2), creat nl  F/u CT A/P today:  1. Interval percutaneous drain placement within the large anterior intra-abdominal abscess, which has decreased in size, currently measuring approximally 2.2 x 14.7 cm. 2. New 8.8 x 4.9 cm abscess in the left lateral abdomen, which appears to communicate with the anterior abscess. 3. Posterior pelvic abscess has also slightly decreased in size, currently measuring 1.8 x 4.9 cm. 4. Multiple dilated loops of small bowel to the level of the anastomosis in the left lower abdomen, similar to prior study, consistent with ileus. Oral contrast reaches the rectum. 5. Essentially resolved right pleural effusion. Unchanged small left pleural effusion. 6. Aortic Atherosclerosis  Images were reviewed by Dr. Maryelizabeth Kaufmann; rec cont drain irrigation, close OP monitoring, lab checks; as long as pt stable rec f/u CT in 5 days with hope that existing drain will help clear left lat abd abscess as well since appears to communicate with ant abscess on today's CT; if  he clinically worsens may need additional drain placement; will likely need drain injection before removal  Electronically Signed: D. Rowe Robert, PA-C 11/01/2021, 4:33 PM   I spent a total of 15 Minutes at the the patient's bedside AND on the patient's hospital floor or unit, greater than 50% of which was counseling/coordinating care for abdominal abscess drain    Patient ID: Ivan Lopez, male   DOB: July 10, 1955, 66 y.o.   MRN: [id number]

## 2021-11-01 NOTE — Progress Notes (Signed)
Received report from previous nurse. Agree with previous assessment. Call light within reach and fall alarm activated.

## 2021-11-01 NOTE — Progress Notes (Signed)
The patient had two large liquid brown bowel movements today.

## 2021-11-01 NOTE — Progress Notes (Signed)
PHARMACY - TOTAL PARENTERAL NUTRITION CONSULT NOTE   Indication: bowel obstruction  Patient Measurements: Height: '5\' 11"'$  (180.3 cm) Weight: 75.2 kg (165 lb 12.6 oz) IBW/kg (Calculated) : 75.3 TPN AdjBW (KG): 68.4 Body mass index is 23.12 kg/m.   Assessment:  66 yo male with hx HTN, HLD, prostate cancer s/p LUTS. S/p robotic assisted laparoscopic radical prostatectomy, bilateral robotic assisted laparoscopic pelvic lymphadenectomy, LOA, oversew of small bowel serosal injuries on 10/04/21.  Patient on TPN 10/11/21-10/16/21. CT on 10/17/21 with SBO with transition slightly above umbilicus. Pharmacy consulted 10/18/21 to resume TPN for SBO. Post-op ileus.  Glucose / Insulin: No hx DM.  A1c 5. - q8h CBGs at goal for 100-150 - 3 units SSI required in past 24 hours Electrolytes: (Na sl low 134), CoCa 10.04, all others WNL Renal: Scr WNL Hepatic: 8/7, Alkphos and AST/ALT up from 8/3. Tbili WNL. Albumin low at <1.7 Trig: 93 ( 8/7)  Intake / Output:  I/O:  - 175 ml  NG to LIWS - none documented last 24h  Abdominal drain: none last 24h  UOP ~ 2475L in 24 hrs MIVF: d/c'd GI Meds: IV PPI/24h, IV Reglan '10mg'$ /6hr GI Imaging: 7/16: CT abd pelvis without abscess or fluid collection but concern for small bowel obstruction. 7/18: AXR: Findings are compatible with interval worsening of reported adynamic ileus 7/19: AXR 8hr delay: Findings compatible with un underlying partial small bowel obstruction. Oral contrast has reached the ascending colon >> 24hr delay shows persistent small bowel dilation measuring up to 4.8 cm.  Oral contrast throughout the colon. 7/23 CT abd pelvis with high-grade partial small bowel obstruction with marked dilation of proximal small bowel loops.  7/28 AXR: Gaseous distention of loops of bowel in the left hemiabdomen may reflect ileus or persistent obstruction 7/31: Abd Xray: improving ileus 8/1 CT: recurrent small bowel obstruction versus postoperative ileus. Large  peripherally enhancing fluid collection.Cannot exclude underlying abscess. Smaller fluid collection is noted within the posterior pelvis  GI Surgeries / Procedures:  7/10: surgery as per HPI 7/26: exploratory laparotomy with lysis of adhesions, small bowel resection, repair of small bowel serosa, repair of ascending colon serosa  8/2: IR image guided aspiration/drainage of ventral abdominal fluid collection-556m  Central access: PICC 7/17 TPN start date: 7/17-7/22, resumed 7/24  Nutritional Goals:  Goal TPN rate is 90 mL/hr (provides 112 g of protein and 2198 kcals per day)  RD Assessment:  Estimated Needs Total Energy Estimated Needs: 2100-2300 kcal Total Protein Estimated Needs: 105-120 grams Total Fluid Estimated Needs: >/= 2.3 L/day  Current Nutrition:  NPO - NGT remains to LIWS TPN  Plan:  No change to TPN formula today. TPN @ goal rate 90 ml/hr  (112g AA, 64.8g lipids (29.4%), 324g dextrose (GIR 2.99), 2198 kcal) meeting 100% of patients needs  Electrolytes in TPN:  Na 70 mEq/L K 50 mEq/L  Ca 3 mEq/L  Mg 7 mEq/L Phos 18 mmol/L Cl:Ac Max acetate Add standard MVI and trace elements to TPN Continue Sensitive q8h SSI and adjust as needed  Monitor TPN labs on Mon/Thurs and PRN  MEudelia Bunch Pharm.D 11/01/2021 8:24 AM Pharmacy: [phone]/09/2021 8:14 AM

## 2021-11-01 NOTE — Progress Notes (Signed)
Patient ID: Ivan Lopez, male   DOB: 09/19/1955, 66 y.o.   MRN: [id number]  12 Days Post-Op Subjective: Began passing flatus today.  CT scan repeated due to persistent WBC elevation and ileus.  Objective: Vital signs in last 24 hours: Temp:  [98.9 F (37.2 C)-99.6 F (37.6 C)] 99.6 F (37.6 C) (08/07 1309) Pulse Rate:  [102-109] 109 (08/07 1309) Resp:  [17-18] 18 (08/07 1309) BP: (127-138)/(74-86) 138/86 (08/07 1309) SpO2:  [99 %-100 %] 99 % (08/07 1309)  Intake/Output from previous day: 08/06 0701 - 08/07 0700 In: 2299.8 [I.V.:996.4; IV Piggyback:1303.5] Out: 2475 [Urine:2475] Intake/Output this shift: Total I/O In: 2341.8 [I.V.:1009.6; Other:5; NG/GT:500; IV Piggyback:827.2] Out: 8119 [Urine:1000; Drains:40]  Physical Exam:  General: Alert and oriented Abd: Abdomen is softer  Lab Results: Recent Labs    10/31/21 0323 11/01/21 0318  HGB 8.2* 8.6*  HCT 25.4* 26.7*   BMET Recent Labs    10/30/21 0250 11/01/21 0318  NA 133* 134*  K 4.0 4.0  CL 100 103  CO2 26 21*  GLUCOSE 132* 130*  BUN 24* 21  CREATININE 0.88 0.99  CALCIUM 7.9* 8.2*     Studies/Results: CT ABDOMEN PELVIS W CONTRAST  Result Date: 11/01/2021 CLINICAL DATA:  Status post laparoscopic prostatectomy 4 weeks ago and exploratory laparotomy with lysis of adhesions and small-bowel resection 12 days ago, complicated by intra-abdominal abscess requiring IR drain placement five days ago. Continued leukocytosis. EXAM: CT ABDOMEN AND PELVIS WITH CONTRAST TECHNIQUE: Multidetector CT imaging of the abdomen and pelvis was performed using the standard protocol following bolus administration of intravenous contrast. RADIATION DOSE REDUCTION: This exam was performed according to the departmental dose-optimization program which includes automated exposure control, adjustment of the mA and/or kV according to patient size and/or use of iterative reconstruction technique. CONTRAST:  166m OMNIPAQUE IOHEXOL 300 MG/ML   SOLN COMPARISON:  CT abdomen pelvis dated October 26, 2021. FINDINGS: Lower chest: Essentially resolved right pleural effusion. Unchanged small left pleural effusion. Continued bilateral posterior lower lobe atelectasis. Hepatobiliary: Unchanged simple cyst in the central liver. No new focal liver abnormality. Unchanged tiny gallstones. No gallbladder wall thickening or biliary dilatation. Pancreas: Unremarkable. No pancreatic ductal dilatation or surrounding inflammatory changes. Spleen: Normal in size without focal abnormality. Adrenals/Urinary Tract: Adrenal glands are unremarkable. Kidneys are normal, without renal calculi, focal lesion, or hydronephrosis. Bladder is unremarkable. Stomach/Bowel: Multiple dilated loops of small bowel to the level of the anastomosis in the left lower abdomen, similar to prior study. Small bowel distal to the anastomosis is decompressed. Oral contrast reaches the rectum. The stomach is within normal limits. Vascular/Lymphatic: Aortic atherosclerosis. No enlarged abdominal or pelvic lymph nodes. Reproductive: Status post prostatectomy. Other: Interval percutaneous drain placement within the large rim enhancing anterior abdominal gas and fluid collection, which has decreased in size, currently measuring approximally 2.2 x 14.7 cm, previously 8.5 x 17.4 cm. New 8.8 x 4.9 cm rim enhancing fluid collection in the left lateral abdomen (series 2, image 58), which appears to communicate with the ventral abdominal fluid collection (series 2, image 55). Posterior pelvic fluid collection has also slightly decreased in size, currently measuring 1.8 x 4.9 cm (series 2, image 85), previously 3.2 x 5.3 cm. Small amount of loculated fluid under the left hemidiaphragm superior to the spleen appears slightly decreased in size compared to prior study. Decreased now trace pneumoperitoneum in the upper abdomen. Musculoskeletal: No acute or significant osseous findings. IMPRESSION: 1. Interval  percutaneous drain placement within the large anterior intra-abdominal abscess, which  has decreased in size, currently measuring approximally 2.2 x 14.7 cm. 2. New 8.8 x 4.9 cm abscess in the left lateral abdomen, which appears to communicate with the anterior abscess. 3. Posterior pelvic abscess has also slightly decreased in size, currently measuring 1.8 x 4.9 cm. 4. Multiple dilated loops of small bowel to the level of the anastomosis in the left lower abdomen, similar to prior study, consistent with ileus. Oral contrast reaches the rectum. 5. Essentially resolved right pleural effusion. Unchanged small left pleural effusion. 6. Aortic Atherosclerosis (ICD10-I70.0). Electronically Signed   By: Titus Dubin M.D.   On: 11/01/2021 14:55    Assessment/Plan: POD # 28 s/p robotic prostatectomy and POD # 12 s/p exploratory laparotomy, adhesiolysis, small bowel resection by General Surgery - Clinical management per General Surgery.  Will continue to monitor.  DVT prophylaxis.  May need further percutaneous drainage of other abscess collections.     LOS: 27 days   [name] 11/01/2021, 5:22 PM

## 2021-11-01 NOTE — Progress Notes (Signed)
Progress Note  12 Days Post-Op  Subjective: No flatus or BM. IR drain appears similar to last week this AM. Patient reports some increased fatigue and complaining that eyes have been burning some.   Objective: Vital signs in last 24 hours: Temp:  [98.7 F (37.1 C)-99.1 F (37.3 C)] 98.9 F (37.2 C) (08/07 0600) Pulse Rate:  [101-103] 103 (08/07 0600) Resp:  [16-18] 18 (08/07 0600) BP: (121-131)/(74-79) 131/79 (08/07 0600) SpO2:  [98 %-100 %] 100 % (08/07 0600) Last BM Date : 10/04/21  Intake/Output from previous day: 08/06 0701 - 08/07 0700 In: 2299.8 [I.V.:996.4; IV Piggyback:1303.5] Out: 2475 [Urine:2475] Intake/Output this shift: Total I/O In: 865.6 [I.V.:289.5; IV Piggyback:576.1] Out: -   PE: Gen:  Alert, NAD, pleasant Pulm:  Normal effort Abd: abdomen distended but soft, appropriately ttp, incision C/D/I with staples present, NGT with thin bilious drainage, BS hypoactive, Drain with seropurulent fluid Skin: warm and dry, no rashes  Psych: A&Ox3    Lab Results:  Recent Labs    10/31/21 0323 11/01/21 0318  WBC 21.6* 22.9*  HGB 8.2* 8.6*  HCT 25.4* 26.7*  PLT 658* 717*    BMET Recent Labs    10/30/21 0250 11/01/21 0318  NA 133* 134*  K 4.0 4.0  CL 100 103  CO2 26 21*  GLUCOSE 132* 130*  BUN 24* 21  CREATININE 0.88 0.99  CALCIUM 7.9* 8.2*    PT/INR No results for input(s): "LABPROT", "INR" in the last 72 hours.  CMP     Component Value Date/Time   NA 134 (L) 11/01/2021 0318   K 4.0 11/01/2021 0318   CL 103 11/01/2021 0318   CO2 21 (L) 11/01/2021 0318   GLUCOSE 130 (H) 11/01/2021 0318   BUN 21 11/01/2021 0318   CREATININE 0.99 11/01/2021 0318   CALCIUM 8.2 (L) 11/01/2021 0318   PROT 6.8 11/01/2021 0318   ALBUMIN 1.7 (L) 11/01/2021 0318   AST 39 11/01/2021 0318   ALT 74 (H) 11/01/2021 0318   ALKPHOS 241 (H) 11/01/2021 0318   BILITOT 0.6 11/01/2021 0318   GFRNONAA >60 11/01/2021 0318   Lipase  No results found for:  "LIPASE"     Studies/Results: No results found.  Anti-infectives: Anti-infectives (From admission, onward)    Start     Dose/Rate Route Frequency Ordered Stop   10/28/21 1000  piperacillin-tazobactam (ZOSYN) IVPB 3.375 g        3.375 g 12.5 mL/hr over 240 Minutes Intravenous Every 8 hours 10/28/21 0904     10/20/21 1000  cefoTEtan (CEFOTAN) 2 g in sodium chloride 0.9 % 100 mL IVPB        2 g 200 mL/hr over 30 Minutes Intravenous  Once 10/19/21 1309 10/20/21 1018   10/20/21 0946  sodium chloride 0.9 % with cefoTEtan (CEFOTAN) ADS Med       Note to Pharmacy: Karsten Ro: cabinet override      10/20/21 0946 10/20/21 0949   10/14/21 1430  sulfamethoxazole-trimethoprim (BACTRIM DS) 800-160 MG per tablet 1 tablet        1 tablet Oral Every 12 hours 10/14/21 1332 10/16/21 2152   10/04/21 2330  ceFAZolin (ANCEF) IVPB 1 g/50 mL premix        1 g 100 mL/hr over 30 Minutes Intravenous Every 8 hours 10/04/21 1737 10/05/21 1014   10/04/21 0922  ceFAZolin (ANCEF) IVPB 2g/100 mL premix        2 g 200 mL/hr over 30 Minutes Intravenous 30 min pre-op 10/04/21  3606 10/04/21 1530   10/04/21 0000  sulfamethoxazole-trimethoprim (BACTRIM DS) 800-160 MG tablet        1 tablet Oral 2 times daily 10/04/21 1048          Assessment/Plan POD#28 - robotic assisted laparoscopic prostatectomy Dr. Alinda Money, and laparoscopic LOA with oversew of small bowel serosal injuries x2 Dr. Brantley Stage 7/10 POD#12 - exploratory laparotomy with LOA and small bowel resection Dr. Dema Severin 7/26 - await resolution of post op ileus - continue NGT to LIWS and TPN  - CT 8/1 with large intra-abdominal fluid collection - s/p IR drain placement 8/2, Cxs with clostridium cadaveris, continue Zosyn,  - afebrile in recorded vitals but WBC remains elevated  - continue IV tylenol and IV robaxin, IV dilaudid prn  - continue to encourage mobilization  - repeat CT AP with PO and IV contrast today, discussed with IR as well     FEN: NPO,  TPN, NGT to LIWS VTE: SQH ID: cefotetan 7/26; Zosyn 8/3>> Foley: out 7/27 and voiding   LOS: 27 days    Norm Parcel, [facility] 11/01/2021, 9:18 AM Please see Amion for pager number during day hours 7:00am-4:30pm

## 2021-11-01 NOTE — Progress Notes (Signed)
PT Cancellation Note  Patient Details Name: Ivan Lopez MRN: [id number] DOB: 13-Nov-1955   Cancelled Treatment:    Reason Eval/Treat Not Completed:  Attempted PT tx this am-pt declined to participate at this time. Will check back as schedule allows.    [facility]  Office: [phone] Pager: [phone]

## 2021-11-01 NOTE — Progress Notes (Signed)
PT Cancellation Note  Patient Details Name: Ivan Lopez MRN: [id number] DOB: [birth year]   Cancelled Treatment:    Reason Eval/Treat Not Completed: Patient at procedure or test/unavailable    ,

## 2021-11-02 LAB — CBC
HCT: 27 % — ABNORMAL LOW (ref 39.0–52.0)
Hemoglobin: 8.7 g/dL — ABNORMAL LOW (ref 13.0–17.0)
MCH: 28.8 pg (ref 26.0–34.0)
MCHC: 32.2 g/dL (ref 30.0–36.0)
MCV: 89.4 fL (ref 80.0–100.0)
Platelets: 708 10*3/uL — ABNORMAL HIGH (ref 150–400)
RBC: 3.02 MIL/uL — ABNORMAL LOW (ref 4.22–5.81)
RDW: 16.7 % — ABNORMAL HIGH (ref 11.5–15.5)
WBC: 23.8 10*3/uL — ABNORMAL HIGH (ref 4.0–10.5)
nRBC: 0.3 % — ABNORMAL HIGH (ref 0.0–0.2)

## 2021-11-02 LAB — GLUCOSE, CAPILLARY
Glucose-Capillary: 140 mg/dL — ABNORMAL HIGH (ref 70–99)
Glucose-Capillary: 134 mg/dL — ABNORMAL HIGH (ref 70–99)
Glucose-Capillary: 146 mg/dL — ABNORMAL HIGH (ref 70–99)

## 2021-11-02 MED ORDER — TRAVASOL 10 % IV SOLN
INTRAVENOUS | Status: AC
  Filled 2021-11-02: qty 1123.2

## 2021-11-02 MED ORDER — ACETAMINOPHEN 10 MG/ML IV SOLN
1000.0000 mg | Freq: Four times a day (QID) | INTRAVENOUS | Status: AC
Start: 2021-11-02 — End: 2021-11-03
  Administered 2021-11-02 (×3): 1000 mg via INTRAVENOUS
  Filled 2021-11-02 (×4): qty 100

## 2021-11-02 NOTE — Progress Notes (Signed)
Referring Physician(s): Stechschulte,P  Supervising Physician: Aletta Edouard  Patient Status:  [facility] - In-pt  Chief Complaint:  Post OP anterior abdominal fluid fluid collection s/p drain placement by Dr. Earleen Newport on 10/27/21.   Subjective:  Pt sitting in chair, sleeping, NAD.  Reports mild abd pain, no N/V.   Allergies: Patient has no known allergies.  Medications: Prior to Admission medications   Medication Sig Start Date End Date Taking? Authorizing Provider  atorvastatin (LIPITOR) 80 MG tablet Take 80 mg by mouth at bedtime. 08/19/21  Yes [provider]  docusate sodium (COLACE) 100 MG capsule Take 1 capsule (100 mg total) by mouth 2 (two) times daily. 10/04/21  Yes Dancy, Estill Bamberg, PA-C  finasteride (PROSCAR) 5 MG tablet Take 5 mg by mouth daily. 09/05/21  Yes [provider]  sulfamethoxazole-trimethoprim (BACTRIM DS) 800-160 MG tablet Take 1 tablet by mouth 2 (two) times daily. Start the day prior to foley removal appointment 10/04/21  Yes Dancy, Estill Bamberg, PA-C  tamsulosin (FLOMAX) 0.4 MG CAPS capsule Take 0.4 mg by mouth at bedtime. 08/19/21  Yes [provider]  traMADol (ULTRAM) 50 MG tablet Take 1-2 tablets (50-100 mg total) by mouth every 6 (six) hours as needed for moderate pain or severe pain. 10/04/21  Yes Debbrah Alar, PA-C     Vital Signs: BP 132/78 (BP Location: Left Arm)   Pulse 99   Temp 98.7 F (37.1 C) (Oral)   Resp 18   Ht '5\' 11"'$  (1.803 m)   Wt 165 lb 12.6 oz (75.2 kg)   SpO2 99%   BMI 23.12 kg/m   Physical Exam Vitals reviewed.  Constitutional:      General: He is not in acute distress.    Comments: Lethargic   HENT:     Head: Normocephalic.     Nose:     Comments: NGT in place  Cardiovascular:     Rate and Rhythm: Normal rate.  Pulmonary:     Effort: Pulmonary effort is normal.  Abdominal:     General: Abdomen is flat.     Palpations: Abdomen is soft.  Skin:    General: Skin is warm.     Coloration: Skin is not  jaundiced or pale.     Comments: Positive RLQ drain to a gravity bag. Site is unremarkable with no erythema, edema, tenderness, bleeding or drainage. Suture and stat lock in place. Dressing is clean, dry, and intact. 50 ml of purulent fluid noted in the bag. Drain aspirates and flushes well.    Neurological:     Mental Status: He is alert.  Psychiatric:        Behavior: Behavior normal.     Imaging: CT ABDOMEN PELVIS W CONTRAST  Result Date: 11/01/2021 CLINICAL DATA:  Status post laparoscopic prostatectomy 4 weeks ago and exploratory laparotomy with lysis of adhesions and small-bowel resection 12 days ago, complicated by intra-abdominal abscess requiring IR drain placement five days ago. Continued leukocytosis. EXAM: CT ABDOMEN AND PELVIS WITH CONTRAST TECHNIQUE: Multidetector CT imaging of the abdomen and pelvis was performed using the standard protocol following bolus administration of intravenous contrast. RADIATION DOSE REDUCTION: This exam was performed according to the departmental dose-optimization program which includes automated exposure control, adjustment of the mA and/or kV according to patient size and/or use of iterative reconstruction technique. CONTRAST:  137m OMNIPAQUE IOHEXOL 300 MG/ML  SOLN COMPARISON:  CT abdomen pelvis dated October 26, 2021. FINDINGS: Lower chest: Essentially resolved right pleural effusion. Unchanged small left pleural effusion.  Continued bilateral posterior lower lobe atelectasis. Hepatobiliary: Unchanged simple cyst in the central liver. No new focal liver abnormality. Unchanged tiny gallstones. No gallbladder wall thickening or biliary dilatation. Pancreas: Unremarkable. No pancreatic ductal dilatation or surrounding inflammatory changes. Spleen: Normal in size without focal abnormality. Adrenals/Urinary Tract: Adrenal glands are unremarkable. Kidneys are normal, without renal calculi, focal lesion, or hydronephrosis. Bladder is unremarkable. Stomach/Bowel:  Multiple dilated loops of small bowel to the level of the anastomosis in the left lower abdomen, similar to prior study. Small bowel distal to the anastomosis is decompressed. Oral contrast reaches the rectum. The stomach is within normal limits. Vascular/Lymphatic: Aortic atherosclerosis. No enlarged abdominal or pelvic lymph nodes. Reproductive: Status post prostatectomy. Other: Interval percutaneous drain placement within the large rim enhancing anterior abdominal gas and fluid collection, which has decreased in size, currently measuring approximally 2.2 x 14.7 cm, previously 8.5 x 17.4 cm. New 8.8 x 4.9 cm rim enhancing fluid collection in the left lateral abdomen (series 2, image 58), which appears to communicate with the ventral abdominal fluid collection (series 2, image 55). Posterior pelvic fluid collection has also slightly decreased in size, currently measuring 1.8 x 4.9 cm (series 2, image 85), previously 3.2 x 5.3 cm. Small amount of loculated fluid under the left hemidiaphragm superior to the spleen appears slightly decreased in size compared to prior study. Decreased now trace pneumoperitoneum in the upper abdomen. Musculoskeletal: No acute or significant osseous findings. IMPRESSION: 1. Interval percutaneous drain placement within the large anterior intra-abdominal abscess, which has decreased in size, currently measuring approximally 2.2 x 14.7 cm. 2. New 8.8 x 4.9 cm abscess in the left lateral abdomen, which appears to communicate with the anterior abscess. 3. Posterior pelvic abscess has also slightly decreased in size, currently measuring 1.8 x 4.9 cm. 4. Multiple dilated loops of small bowel to the level of the anastomosis in the left lower abdomen, similar to prior study, consistent with ileus. Oral contrast reaches the rectum. 5. Essentially resolved right pleural effusion. Unchanged small left pleural effusion. 6. Aortic Atherosclerosis (ICD10-I70.0). Electronically Signed   By: Titus Dubin M.D.   On: 11/01/2021 14:55    Labs:  CBC: Recent Labs    10/29/21 0330 10/31/21 0323 11/01/21 0318 11/02/21 0410  WBC 21.4* 21.6* 22.9* 23.8*  HGB 8.6* 8.2* 8.6* 8.7*  HCT 26.4* 25.4* 26.7* 27.0*  PLT 616* 658* 717* 708*    COAGS: Recent Labs    10/27/21 0417  INR 1.1    BMP: Recent Labs    10/26/21 0454 10/28/21 0445 10/30/21 0250 11/01/21 0318  NA 137 135 133* 134*  K 3.7 4.2 4.0 4.0  CL 108 104 100 103  CO2 21* 24 26 21*  GLUCOSE 146* 143* 132* 130*  BUN 18 20 24* 21  CALCIUM 8.1* 7.7* 7.9* 8.2*  CREATININE 0.82 0.87 0.88 0.99  GFRNONAA >60 >60 >60 >60    LIVER FUNCTION TESTS: Recent Labs    10/21/21 0337 10/25/21 0335 10/28/21 0445 11/01/21 0318  BILITOT 0.4 0.5 0.3 0.6  AST 20 99* 20 39  ALT 97* 153* 81* 74*  ALKPHOS 96 237* 182* 241*  PROT 5.7* 5.5* 5.7* 6.8  ALBUMIN 1.9* <1.5* <1.5* 1.7*    Assessment and Plan:  66 y.o. male with recent  ex lap lysis of adhesion, SB repair and ressection on 10/20/21, CT on 8/1 showed fluid collection within the ventral abdomen exhibiting mass effect and displacing the bowel loops posteriorly and laterally, s/p drain placement with  Dr. Earleen Newport on 8/2.   OP 80 mL appears  purulent today  WBC  trending up 23.8 (22.9<21.6), afebrile  Cx Clostridium cadaveris   F/u CT from yesterday showed new left lateral abdomen abscess - reviewed with Dr. Maryelizabeth Kaufmann - recommended cont current management and repeat CT in 5 days   Drain Location: RLQ Size: Fr size: 12 Fr Date of placement: 8/2  Currently to: Drain collection device: gravity 24 hour output:  Output by Drain (mL) 10/31/21 0701 - 10/31/21 1900 10/31/21 1901 - 11/01/21 0700 11/01/21 0701 - 11/01/21 1900 11/01/21 1901 - 11/02/21 0700 11/02/21 0701 - 11/02/21 1353  Closed System Drain 1 Inferior Abdomen 12 Fr.   40 40     Interval imaging/drain manipulation:  8/7 CT AP with:  1. Interval percutaneous drain placement within the large  anterior intra-abdominal abscess, which has decreased in size, currently measuring approximally 2.2 x 14.7 cm. 2. New 8.8 x 4.9 cm abscess in the left lateral abdomen, which appears to communicate with the anterior abscess. 3. Posterior pelvic abscess has also slightly decreased in size, currently measuring 1.8 x 4.9 cm. 4. Multiple dilated loops of small bowel to the level of the anastomosis in the left lower abdomen, similar to prior study, consistent with ileus. Oral contrast reaches the rectum. 5. Essentially resolved right pleural effusion. Unchanged small left pleural effusion. 6. Aortic Atherosclerosis (ICD10-I70.0).   Current examination: Flushes/aspirates easily.  Insertion site unremarkable. Suture and stat lock in place. Dressed appropriately.   Plan: No plan for additional drain, recommend repeat CT in 4 days or sooner if pt clinically worsens  Continue TID flushes with 5 cc NS. Record output Q shift. Dressing changes QD or PRN if soiled.  Call IR APP or on call IR MD if difficulty flushing or sudden change in drain output.  Repeat imaging/possible drain injection once output < 10 mL/QD (excluding flush material). Consideration for drain removal if output is < 10 mL/QD (excluding flush material), pending discussion with the providing surgical service.  Discharge planning: Please contact IR APP or on call IR MD prior to patient d/c to ensure appropriate follow up plans are in place. Typically patient will follow up with IR clinic 10-14 days post d/c for repeat imaging/possible drain injection. IR scheduler will contact patient with date/time of appointment. Patient will need to flush drain QD with 5 cc NS, record output QD, dressing changes every 2-3 days or earlier if soiled.   IR will continue to follow - please call with questions or concerns.  Electronically Signed: Tera Mater, PA-C 11/02/2021, 1:48 PM   I spent a total of 15 Minutes at the the patient's bedside  AND on the patient's hospital floor or unit, greater than 50% of which was counseling/coordinating care for anterior abdominal abscess drain f/u.   This chart was dictated using voice recognition software.  Despite best efforts to proofread,  errors can occur which can change the documentation meaning.

## 2021-11-02 NOTE — Progress Notes (Signed)
Physical Therapy Treatment Patient Details Name: Ivan Lopez MRN: [id number] DOB: 12-13-1955 Today's Date: 11/02/2021   History of Present Illness Pt is a 66 year old male s/p robotic assisted laparoscopic prostatectomy Dr. Alinda Money, and laparoscopic LOA with oversew of small bowel serosal injuries x2 Dr. Brantley Stage on 10/04/21.  Pt is also s/p EXPLORATORY LAPAROTOMY, LYSIS OF ADHESSIONS, SMALL BOWEL RESECTION, REPAIR OF SMALL BOWEL on 10/20/21, s/p drain placement 8/2    PT Comments    Pt up walking with wife. Therapist took over session for remainder of distance. Moderate pain with activity. Pt reports he tries to walk at least 1x/day. Pt and wife wish for therapy to continue to follow him during this hospital stay. Good candidate for mobility specialist team.     Recommendations for follow up therapy are one component of a multi-disciplinary discharge planning process, led by the attending physician.  Recommendations may be updated based on patient status, additional functional criteria and insurance authorization.  Follow Up Recommendations  Outpatient PT     Assistance Recommended at Discharge PRN  Patient can return home with the following A little help with walking and/or transfers;A little help with bathing/dressing/bathroom   Equipment Recommendations  Rolling walker (2 wheels);BSC/3in1    Recommendations for Other Services       Precautions / Restrictions Precautions Precautions: Fall Precaution Comments: TPN, abd drain Restrictions Weight Bearing Restrictions: No     Mobility  Bed Mobility               General bed mobility comments: oob in recliner    Transfers Overall transfer level: Needs assistance Equipment used: Rolling walker (2 wheels) Transfers: Sit to/from Stand Sit to Stand: Supervision           General transfer comment: Supv for safety, Wife helps manage lines    Ambulation/Gait Ambulation/Gait assistance: Min guard Gait Distance  (Feet): 250 Feet Assistive device: Rolling walker (2 wheels) Gait Pattern/deviations: Step-through pattern, Decreased stride length       General Gait Details: Min guard for safety. No LOB with RW use.   Stairs             Wheelchair Mobility    Modified Rankin (Stroke Patients Only)       Balance Overall balance assessment: Mild deficits observed, not formally tested                                          Cognition Arousal/Alertness: Awake/alert Behavior During Therapy: WFL for tasks assessed/performed Overall Cognitive Status: Within Functional Limits for tasks assessed                                          Exercises      General Comments        Pertinent Vitals/Pain Pain Assessment Pain Assessment: Faces Faces Pain Scale: Hurts little more Pain Location: abdomen Pain Descriptors / Indicators: Discomfort, Sore Pain Intervention(s): Monitored during session    Home Living                          Prior Function            PT Goals (current goals can now be found in the care plan section) Progress towards PT goals:  Progressing toward goals    Frequency    Min 3X/week      PT Plan Current plan remains appropriate    Co-evaluation              AM-PAC PT "6 Clicks" Mobility   Outcome Measure  Help needed turning from your back to your side while in a flat bed without using bedrails?: A Little Help needed moving from lying on your back to sitting on the side of a flat bed without using bedrails?: A Little Help needed moving to and from a bed to a chair (including a wheelchair)?: A Little Help needed standing up from a chair using your arms (e.g., wheelchair or bedside chair)?: A Little Help needed to walk in hospital room?: A Little Help needed climbing 3-5 steps with a railing? : A Little 6 Click Score: 18    End of Session Equipment Utilized During Treatment: Gait belt Activity  Tolerance: Patient tolerated treatment well Patient left: in chair;with call bell/phone within reach;with family/visitor present   PT Visit Diagnosis: Difficulty in walking, not elsewhere classified (R26.2)     Time: 7035-0093 PT Time Calculation (min) (ACUTE ONLY): 15 min  Charges:  $Gait Training: 8-22 mins              Doreatha Massed, PT Acute Rehabilitation  Office: [phone] Pager: [phone]

## 2021-11-02 NOTE — Progress Notes (Signed)
Patient ID: Ivan Lopez, male   DOB: [birth year], 66 y.o.   MRN: [id number]  13 Days Post-Op Subjective: Passing flatus and having BMs now.  NG removed and tolerating clear liquids currently.  Objective: Vital signs in last 24 hours: Temp:  [98.2 F (36.8 C)-99.1 F (37.3 C)] 98.2 F (36.8 C) (08/08 1421) Pulse Rate:  [99-106] 103 (08/08 1421) Resp:  [17-18] 17 (08/08 1421) BP: (127-139)/(76-78) 127/76 (08/08 1421) SpO2:  [99 %-100 %] 100 % (08/08 1421)  Intake/Output from previous day: 08/07 0701 - 08/08 0700 In: 2580 [I.V.:1228.9; NG/GT:500; IV Piggyback:836.1] Out: 2280 [Urine:2200; Drains:80] Intake/Output this shift: Total I/O In: 2357.1 [I.V.:1850; Other:5; IV Piggyback:502.1] Out: 40 [Drains:40]  Physical Exam:  General: Alert and oriented   Lab Results: Recent Labs    10/31/21 0323 11/01/21 0318 11/02/21 0410  HGB 8.2* 8.6* 8.7*  HCT 25.4* 26.7* 27.0*   BMET Recent Labs    11/01/21 0318  NA 134*  K 4.0  CL 103  CO2 21*  GLUCOSE 130*  BUN 21  CREATININE 0.99  CALCIUM 8.2*     Studies/Results: CT ABDOMEN PELVIS W CONTRAST  Result Date: 11/01/2021 CLINICAL DATA:  Status post laparoscopic prostatectomy 4 weeks ago and exploratory laparotomy with lysis of adhesions and small-bowel resection 12 days ago, complicated by intra-abdominal abscess requiring IR drain placement five days ago. Continued leukocytosis. EXAM: CT ABDOMEN AND PELVIS WITH CONTRAST TECHNIQUE: Multidetector CT imaging of the abdomen and pelvis was performed using the standard protocol following bolus administration of intravenous contrast. RADIATION DOSE REDUCTION: This exam was performed according to the departmental dose-optimization program which includes automated exposure control, adjustment of the mA and/or kV according to patient size and/or use of iterative reconstruction technique. CONTRAST:  155m OMNIPAQUE IOHEXOL 300 MG/ML  SOLN COMPARISON:  CT abdomen pelvis dated October 26, 2021. FINDINGS: Lower chest: Essentially resolved right pleural effusion. Unchanged small left pleural effusion. Continued bilateral posterior lower lobe atelectasis. Hepatobiliary: Unchanged simple cyst in the central liver. No new focal liver abnormality. Unchanged tiny gallstones. No gallbladder wall thickening or biliary dilatation. Pancreas: Unremarkable. No pancreatic ductal dilatation or surrounding inflammatory changes. Spleen: Normal in size without focal abnormality. Adrenals/Urinary Tract: Adrenal glands are unremarkable. Kidneys are normal, without renal calculi, focal lesion, or hydronephrosis. Bladder is unremarkable. Stomach/Bowel: Multiple dilated loops of small bowel to the level of the anastomosis in the left lower abdomen, similar to prior study. Small bowel distal to the anastomosis is decompressed. Oral contrast reaches the rectum. The stomach is within normal limits. Vascular/Lymphatic: Aortic atherosclerosis. No enlarged abdominal or pelvic lymph nodes. Reproductive: Status post prostatectomy. Other: Interval percutaneous drain placement within the large rim enhancing anterior abdominal gas and fluid collection, which has decreased in size, currently measuring approximally 2.2 x 14.7 cm, previously 8.5 x 17.4 cm. New 8.8 x 4.9 cm rim enhancing fluid collection in the left lateral abdomen (series 2, image 58), which appears to communicate with the ventral abdominal fluid collection (series 2, image 55). Posterior pelvic fluid collection has also slightly decreased in size, currently measuring 1.8 x 4.9 cm (series 2, image 85), previously 3.2 x 5.3 cm. Small amount of loculated fluid under the left hemidiaphragm superior to the spleen appears slightly decreased in size compared to prior study. Decreased now trace pneumoperitoneum in the upper abdomen. Musculoskeletal: No acute or significant osseous findings. IMPRESSION: 1. Interval percutaneous drain placement within the large anterior  intra-abdominal abscess, which has decreased in size, currently measuring approximally 2.2 x 14.7  cm. 2. New 8.8 x 4.9 cm abscess in the left lateral abdomen, which appears to communicate with the anterior abscess. 3. Posterior pelvic abscess has also slightly decreased in size, currently measuring 1.8 x 4.9 cm. 4. Multiple dilated loops of small bowel to the level of the anastomosis in the left lower abdomen, similar to prior study, consistent with ileus. Oral contrast reaches the rectum. 5. Essentially resolved right pleural effusion. Unchanged small left pleural effusion. 6. Aortic Atherosclerosis (ICD10-I70.0). Electronically Signed   By: Titus Dubin M.D.   On: 11/01/2021 14:55    Assessment/Plan: POD # 29 s/p robotic prostatectomy and POD # 13 s/p exploratory laparotomy, adhesiolysis, small bowel resection by General Surgery - Clinical management per General Surgery.  Will continue to monitor.  DVT prophylaxis.  Plan is to hold off on further drainage of collections for now and repeat imaging in a few days.     LOS: 28 days   [name] 11/02/2021, 5:46 PM

## 2021-11-02 NOTE — Progress Notes (Signed)
Progress Note  13 Days Post-Op  Subjective: 2 large BM after CT yesterday and passing flatus. Discussed CT results and plans to continue drain and abx.   Objective: Vital signs in last 24 hours: Temp:  [98.7 F (37.1 C)-99.6 F (37.6 C)] 98.7 F (37.1 C) (08/08 0612) Pulse Rate:  [99-109] 99 (08/08 0612) Resp:  [18] 18 (08/08 0612) BP: (132-139)/(76-86) 132/78 (08/08 0612) SpO2:  [99 %] 99 % (08/08 0612) Last BM Date : 11/01/21  Intake/Output from previous day: 08/07 0701 - 08/08 0700 In: 2580 [I.V.:1228.9; NG/GT:500; IV Piggyback:836.1] Out: 2280 [Urine:2200; Drains:80] Intake/Output this shift: Total I/O In: 1510.2 [I.V.:1220; IV Piggyback:290.3] Out: -   PE: Gen:  Alert, NAD, pleasant Pulm:  Normal effort Abd: abdomen distended but soft, appropriately ttp, NGT with thin bilious drainage, +BS, Drain with purulent fluid Skin: warm and dry, no rashes  Psych: A&Ox3    Lab Results:  Recent Labs    11/01/21 0318 11/02/21 0410  WBC 22.9* 23.8*  HGB 8.6* 8.7*  HCT 26.7* 27.0*  PLT 717* 708*    BMET Recent Labs    11/01/21 0318  NA 134*  K 4.0  CL 103  CO2 21*  GLUCOSE 130*  BUN 21  CREATININE 0.99  CALCIUM 8.2*    PT/INR No results for input(s): "LABPROT", "INR" in the last 72 hours.  CMP     Component Value Date/Time   NA 134 (L) 11/01/2021 0318   K 4.0 11/01/2021 0318   CL 103 11/01/2021 0318   CO2 21 (L) 11/01/2021 0318   GLUCOSE 130 (H) 11/01/2021 0318   BUN 21 11/01/2021 0318   CREATININE 0.99 11/01/2021 0318   CALCIUM 8.2 (L) 11/01/2021 0318   PROT 6.8 11/01/2021 0318   ALBUMIN 1.7 (L) 11/01/2021 0318   AST 39 11/01/2021 0318   ALT 74 (H) 11/01/2021 0318   ALKPHOS 241 (H) 11/01/2021 0318   BILITOT 0.6 11/01/2021 0318   GFRNONAA >60 11/01/2021 0318   Lipase  No results found for: "LIPASE"     Studies/Results: CT ABDOMEN PELVIS W CONTRAST  Result Date: 11/01/2021 CLINICAL DATA:  Status post laparoscopic prostatectomy 4 weeks  ago and exploratory laparotomy with lysis of adhesions and small-bowel resection 12 days ago, complicated by intra-abdominal abscess requiring IR drain placement five days ago. Continued leukocytosis. EXAM: CT ABDOMEN AND PELVIS WITH CONTRAST TECHNIQUE: Multidetector CT imaging of the abdomen and pelvis was performed using the standard protocol following bolus administration of intravenous contrast. RADIATION DOSE REDUCTION: This exam was performed according to the departmental dose-optimization program which includes automated exposure control, adjustment of the mA and/or kV according to patient size and/or use of iterative reconstruction technique. CONTRAST:  147m OMNIPAQUE IOHEXOL 300 MG/ML  SOLN COMPARISON:  CT abdomen pelvis dated October 26, 2021. FINDINGS: Lower chest: Essentially resolved right pleural effusion. Unchanged small left pleural effusion. Continued bilateral posterior lower lobe atelectasis. Hepatobiliary: Unchanged simple cyst in the central liver. No new focal liver abnormality. Unchanged tiny gallstones. No gallbladder wall thickening or biliary dilatation. Pancreas: Unremarkable. No pancreatic ductal dilatation or surrounding inflammatory changes. Spleen: Normal in size without focal abnormality. Adrenals/Urinary Tract: Adrenal glands are unremarkable. Kidneys are normal, without renal calculi, focal lesion, or hydronephrosis. Bladder is unremarkable. Stomach/Bowel: Multiple dilated loops of small bowel to the level of the anastomosis in the left lower abdomen, similar to prior study. Small bowel distal to the anastomosis is decompressed. Oral contrast reaches the rectum. The stomach is within normal limits. Vascular/Lymphatic:  Aortic atherosclerosis. No enlarged abdominal or pelvic lymph nodes. Reproductive: Status post prostatectomy. Other: Interval percutaneous drain placement within the large rim enhancing anterior abdominal gas and fluid collection, which has decreased in size, currently  measuring approximally 2.2 x 14.7 cm, previously 8.5 x 17.4 cm. New 8.8 x 4.9 cm rim enhancing fluid collection in the left lateral abdomen (series 2, image 58), which appears to communicate with the ventral abdominal fluid collection (series 2, image 55). Posterior pelvic fluid collection has also slightly decreased in size, currently measuring 1.8 x 4.9 cm (series 2, image 85), previously 3.2 x 5.3 cm. Small amount of loculated fluid under the left hemidiaphragm superior to the spleen appears slightly decreased in size compared to prior study. Decreased now trace pneumoperitoneum in the upper abdomen. Musculoskeletal: No acute or significant osseous findings. IMPRESSION: 1. Interval percutaneous drain placement within the large anterior intra-abdominal abscess, which has decreased in size, currently measuring approximally 2.2 x 14.7 cm. 2. New 8.8 x 4.9 cm abscess in the left lateral abdomen, which appears to communicate with the anterior abscess. 3. Posterior pelvic abscess has also slightly decreased in size, currently measuring 1.8 x 4.9 cm. 4. Multiple dilated loops of small bowel to the level of the anastomosis in the left lower abdomen, similar to prior study, consistent with ileus. Oral contrast reaches the rectum. 5. Essentially resolved right pleural effusion. Unchanged small left pleural effusion. 6. Aortic Atherosclerosis (ICD10-I70.0). Electronically Signed   By: Titus Dubin M.D.   On: 11/01/2021 14:55    Anti-infectives: Anti-infectives (From admission, onward)    Start     Dose/Rate Route Frequency Ordered Stop   10/28/21 1000  piperacillin-tazobactam (ZOSYN) IVPB 3.375 g        3.375 g 12.5 mL/hr over 240 Minutes Intravenous Every 8 hours 10/28/21 0904     10/20/21 1000  cefoTEtan (CEFOTAN) 2 g in sodium chloride 0.9 % 100 mL IVPB        2 g 200 mL/hr over 30 Minutes Intravenous  Once 10/19/21 1309 10/20/21 1018   10/20/21 0946  sodium chloride 0.9 % with cefoTEtan (CEFOTAN) ADS Med        Note to Pharmacy: Karsten Ro: cabinet override      10/20/21 0946 10/20/21 0949   10/14/21 1430  sulfamethoxazole-trimethoprim (BACTRIM DS) 800-160 MG per tablet 1 tablet        1 tablet Oral Every 12 hours 10/14/21 1332 10/16/21 2152   10/04/21 2330  ceFAZolin (ANCEF) IVPB 1 g/50 mL premix        1 g 100 mL/hr over 30 Minutes Intravenous Every 8 hours 10/04/21 1737 10/05/21 1014   10/04/21 0922  ceFAZolin (ANCEF) IVPB 2g/100 mL premix        2 g 200 mL/hr over 30 Minutes Intravenous 30 min pre-op 10/04/21 3382 10/04/21 1530   10/04/21 0000  sulfamethoxazole-trimethoprim (BACTRIM DS) 800-160 MG tablet        1 tablet Oral 2 times daily 10/04/21 1048          Assessment/Plan POD#29 - robotic assisted laparoscopic prostatectomy Dr. Alinda Money, and laparoscopic LOA with oversew of small bowel serosal injuries x2 Dr. Brantley Stage 7/10 POD#13 - exploratory laparotomy with LOA and small bowel resection Dr. Dema Severin 7/26 - await resolution of post op ileus - continue NGT to LIWS and TPN  - CT 8/1 with large intra-abdominal fluid collection - s/p IR drain placement 8/2, Cxs with clostridium cadaveris, continue Zosyn,  - CT AP yesterday with decreased size  of collections, L lateral collection appears to communicate with anterior, IR recs repeat imaging in 5 days  - now having some bowel function - clamp NGT and trial CLD this AM, likely removed NGT this afternoon if tolerating - continue mobilization  - plan to remove staples tomorrow (POD14)    FEN: CLD, TPN, NGT clamp VTE: SQH ID: cefotetan 7/26; Zosyn 8/3>> Foley: out 7/27 and voiding   LOS: 28 days    Norm Parcel, [facility] 11/02/2021, 8:16 AM Please see Amion for pager number during day hours 7:00am-4:30pm

## 2021-11-02 NOTE — Progress Notes (Signed)
PT Cancellation Note  Patient Details Name: Ivan Lopez MRN: [id number] DOB: June 19, 1955   Cancelled Treatment:    Reason Eval/Treat Not Completed: Patient declined, no reason specified    Doreatha Massed, PT Acute Rehabilitation  Office: [phone] Pager: [phone]

## 2021-11-02 NOTE — Progress Notes (Signed)
PHARMACY - TOTAL PARENTERAL NUTRITION CONSULT NOTE   Indication: bowel obstruction  Patient Measurements: Height: '5\' 11"'$  (180.3 cm) Weight: 75.2 kg (165 lb 12.6 oz) IBW/kg (Calculated) : 75.3 TPN AdjBW (KG): 68.4 Body mass index is 23.12 kg/m.   Assessment:  66 yo male with hx HTN, HLD, prostate cancer s/p LUTS. S/p robotic assisted laparoscopic radical prostatectomy, bilateral robotic assisted laparoscopic pelvic lymphadenectomy, LOA, oversew of small bowel serosal injuries on 10/04/21.  Patient on TPN 10/11/21-10/16/21. CT on 10/17/21 with SBO with transition slightly above umbilicus. Pharmacy consulted 10/18/21 to resume TPN for SBO. Post-op ileus.  Glucose / Insulin: No hx DM.  A1c 5. - q8h CBGs at goal for 100-150 - 4 units SSI required in past 24 hours Electrolytes: on 8/7: (Na sl low 134), CoCa 10.04, all others WNL Renal: Scr WNL 8/7 Hepatic: 8/7, Alkphos and AST/ALT up from 8/3. Tbili WNL. Albumin low at <1.7 Trig: 93 ( 8/7)  Intake / Output:  I/O:  + 300 ml. Stool x 2 NG to LIWS - none documented last 24h  Abdominal drain: 80 ml 24h  UOP ~ 2200L in 24 hrs MIVF: d/c'd GI Meds: IV PPI/24h, IV Reglan '10mg'$ /6hr GI Imaging: 7/16: CT abd pelvis without abscess or fluid collection but concern for small bowel obstruction. 7/18: AXR: Findings are compatible with interval worsening of reported adynamic ileus 7/19: AXR 8hr delay: Findings compatible with un underlying partial small bowel obstruction. Oral contrast has reached the ascending colon >> 24hr delay shows persistent small bowel dilation measuring up to 4.8 cm.  Oral contrast throughout the colon. 7/23 CT abd pelvis with high-grade partial small bowel obstruction with marked dilation of proximal small bowel loops.  7/28 AXR: Gaseous distention of loops of bowel in the left hemiabdomen may reflect ileus or persistent obstruction 7/31: Abd Xray: improving ileus 8/1 CT: recurrent small bowel obstruction versus postoperative  ileus. Large peripherally enhancing fluid collection.Cannot exclude underlying abscess. Smaller fluid collection is noted within the posterior pelvis 8/7 CT AP: anterior IA abscess decreased in size, new abscess left lateral abd, appears to communicate w/ anterior abscess, posterior pelvic abscess sl decr in size; ileus  GI Surgeries / Procedures:  7/10: surgery as per HPI 7/26: exploratory laparotomy with lysis of adhesions, small bowel resection, repair of small bowel serosa, repair of ascending colon serosa  8/2: IR image guided aspiration/drainage of ventral abdominal fluid collection-571m  Central access: PICC 7/17 TPN start date: 7/17-7/22, resumed 7/24  Nutritional Goals:  Goal TPN rate is 90 mL/hr (provides 112 g of protein and 2198 kcals per day)  RD Assessment:  Estimated Needs Total Energy Estimated Needs: 2100-2300 kcal Total Protein Estimated Needs: 105-120 grams Total Fluid Estimated Needs: >/= 2.3 L/day  Current Nutrition:  NPO - NGT remains to LIWS TPN  Plan:  No change to TPN formula today. TPN @ goal rate 90 ml/hr  (112g AA, 64.8g lipids (29.4%), 324g dextrose (GIR 2.99), 2198 kcal) meeting 100% of patients needs  Electrolytes in TPN:  Na 70 mEq/L K 50 mEq/L  Ca 3 mEq/L  Mg 7 mEq/L Phos 18 mmol/L Cl:Ac Max acetate Add standard MVI and trace elements to TPN Continue Sensitive q8h SSI and adjust as needed  Monitor TPN labs on Mon/Thurs and PRN  MEudelia Bunch Pharm.D 11/02/2021 7:45 AM Pharmacy: [phone]/10/2021 7:45 AM

## 2021-11-03 LAB — GLUCOSE, CAPILLARY
Glucose-Capillary: 153 mg/dL — ABNORMAL HIGH (ref 70–99)
Glucose-Capillary: 128 mg/dL — ABNORMAL HIGH (ref 70–99)
Glucose-Capillary: 132 mg/dL — ABNORMAL HIGH (ref 70–99)

## 2021-11-03 LAB — CBC
HCT: 25.8 % — ABNORMAL LOW (ref 39.0–52.0)
Hemoglobin: 8.1 g/dL — ABNORMAL LOW (ref 13.0–17.0)
MCH: 28.2 pg (ref 26.0–34.0)
MCHC: 31.4 g/dL (ref 30.0–36.0)
MCV: 89.9 fL (ref 80.0–100.0)
Platelets: 740 10*3/uL — ABNORMAL HIGH (ref 150–400)
RBC: 2.87 MIL/uL — ABNORMAL LOW (ref 4.22–5.81)
RDW: 16.7 % — ABNORMAL HIGH (ref 11.5–15.5)
WBC: 19.2 10*3/uL — ABNORMAL HIGH (ref 4.0–10.5)
nRBC: 0.3 % — ABNORMAL HIGH (ref 0.0–0.2)

## 2021-11-03 MED ORDER — TRAVASOL 10 % IV SOLN
INTRAVENOUS | Status: AC
  Filled 2021-11-03: qty 561.6

## 2021-11-03 MED ORDER — ACETAMINOPHEN 325 MG PO TABS
650.0000 mg | ORAL_TABLET | Freq: Four times a day (QID) | ORAL | Status: DC
  Administered 2021-11-03 – 2021-11-06 (×14): 650 mg via ORAL
  Filled 2021-11-03 (×14): qty 2

## 2021-11-03 MED ORDER — TRAMADOL HCL 50 MG PO TABS
50.0000 mg | ORAL_TABLET | Freq: Four times a day (QID) | ORAL | Status: DC | PRN
  Administered 2021-11-03 – 2021-11-06 (×7): 50 mg via ORAL
  Filled 2021-11-03 (×7): qty 1

## 2021-11-03 MED ORDER — TRAVASOL 10 % IV SOLN
INTRAVENOUS | Status: DC
  Filled 2021-11-03: qty 1123.2

## 2021-11-03 MED ORDER — METHOCARBAMOL 500 MG PO TABS
500.0000 mg | ORAL_TABLET | Freq: Three times a day (TID) | ORAL | Status: DC | PRN
Start: 2021-11-03 — End: 2021-11-06
  Administered 2021-11-04: 500 mg via ORAL
  Filled 2021-11-03 (×2): qty 1

## 2021-11-03 NOTE — TOC Progression Note (Signed)
Transition of Care [facility]) - Progression Note    Patient Details  Name: Ivan Lopez MRN: [id number] Date of Birth: 06-28-1955  [facility] CM/SW Contact  Oneida Mckamey, [name], RN Phone Number: 11/03/2021, 3:50 PM  Clinical Narrative: Per [facility] will likely not need TPN or HHRN-following is [name] HHRN if needed.      Expected Discharge Plan: [place] Barriers to Discharge: Continued Medical Work up  Expected Discharge Plan and Services Expected Discharge Plan: [place]   Discharge Planning Services: CM Consult   Living arrangements for the past 2 months: Single Family Home                                       Social Determinants of Health (SDOH) Interventions    Readmission Risk Interventions     No data to display

## 2021-11-03 NOTE — Discharge Summary (Addendum)
[facility] Discharge Summary   Patient ID: Ivan Lopez MRN: [id number] DOB/AGE: 1955-09-25 66 y.o.  Admit date: 10/04/2021 Discharge date: 11/06/2021  Admitting Diagnosis: Prostate cancer   Discharge Diagnosis S/P prostatectomy  Early post-operative SBO S/P exploratory laparotomy, lysis of adhesions and small bowel resection  Post-operative ileus, resolved Post-operative intra-abdominal abscess s/p IR drain placement   Consultants Urology  Interventional radiology   Imaging: CT ABDOMEN PELVIS W CONTRAST  Result Date: 11/01/2021 CLINICAL DATA:  Status post laparoscopic prostatectomy 4 weeks ago and exploratory laparotomy with lysis of adhesions and small-bowel resection 12 days ago, complicated by intra-abdominal abscess requiring IR drain placement five days ago. Continued leukocytosis. EXAM: CT ABDOMEN AND PELVIS WITH CONTRAST TECHNIQUE: Multidetector CT imaging of the abdomen and pelvis was performed using the standard protocol following bolus administration of intravenous contrast. RADIATION DOSE REDUCTION: This exam was performed according to the departmental dose-optimization program which includes automated exposure control, adjustment of the mA and/or kV according to patient size and/or use of iterative reconstruction technique. CONTRAST:  192m OMNIPAQUE IOHEXOL 300 MG/ML  SOLN COMPARISON:  CT abdomen pelvis dated October 26, 2021. FINDINGS: Lower chest: Essentially resolved right pleural effusion. Unchanged small left pleural effusion. Continued bilateral posterior lower lobe atelectasis. Hepatobiliary: Unchanged simple cyst in the central liver. No new focal liver abnormality. Unchanged tiny gallstones. No gallbladder wall thickening or biliary dilatation. Pancreas: Unremarkable. No pancreatic ductal dilatation or surrounding inflammatory changes. Spleen: Normal in size without focal abnormality. Adrenals/Urinary Tract: Adrenal glands are unremarkable. Kidneys are  normal, without renal calculi, focal lesion, or hydronephrosis. Bladder is unremarkable. Stomach/Bowel: Multiple dilated loops of small bowel to the level of the anastomosis in the left lower abdomen, similar to prior study. Small bowel distal to the anastomosis is decompressed. Oral contrast reaches the rectum. The stomach is within normal limits. Vascular/Lymphatic: Aortic atherosclerosis. No enlarged abdominal or pelvic lymph nodes. Reproductive: Status post prostatectomy. Other: Interval percutaneous drain placement within the large rim enhancing anterior abdominal gas and fluid collection, which has decreased in size, currently measuring approximally 2.2 x 14.7 cm, previously 8.5 x 17.4 cm. New 8.8 x 4.9 cm rim enhancing fluid collection in the left lateral abdomen (series 2, image 58), which appears to communicate with the ventral abdominal fluid collection (series 2, image 55). Posterior pelvic fluid collection has also slightly decreased in size, currently measuring 1.8 x 4.9 cm (series 2, image 85), previously 3.2 x 5.3 cm. Small amount of loculated fluid under the left hemidiaphragm superior to the spleen appears slightly decreased in size compared to prior study. Decreased now trace pneumoperitoneum in the upper abdomen. Musculoskeletal: No acute or significant osseous findings. IMPRESSION: 1. Interval percutaneous drain placement within the large anterior intra-abdominal abscess, which has decreased in size, currently measuring approximally 2.2 x 14.7 cm. 2. New 8.8 x 4.9 cm abscess in the left lateral abdomen, which appears to communicate with the anterior abscess. 3. Posterior pelvic abscess has also slightly decreased in size, currently measuring 1.8 x 4.9 cm. 4. Multiple dilated loops of small bowel to the level of the anastomosis in the left lower abdomen, similar to prior study, consistent with ileus. Oral contrast reaches the rectum. 5. Essentially resolved right pleural effusion. Unchanged small  left pleural effusion. 6. Aortic Atherosclerosis (ICD10-I70.0). Electronically Signed   By: WTitus DubinM.D.   On: 11/01/2021 14:55    Procedures Dr. LRaynelle Bring(10/04/21) - Robotic assisted laparoscopic prostatectomy  Dr. TMarcello MooresCornett (10/04/21) - Laparoscopic LOA with oversew of  small bowel serosal injury x2 Dr. Nadeen Landau (10/20/21) - Exploratory laparotomy, LOA, small bowel resection  Dr. Corrie Mckusick (10/27/21) - Image guided drain placement   Hospital Course:  Patient is a 66 year old male who initially presented to the hospital for prostatectomy for prostate cancer.  Patient was admitted and underwent procedure listed above. General surgery consult called intra-operatively due to severe adhesions. Tolerated procedure well and was transferred to the floor. Patient developed post-op ileus and was treated with NGT decompression and bowel rest. CT 7/16 with question of ileus vs early post-operative SBO, patient continued to have some bowel function so post-op ileus thought more likely. PICC and TPN were initiated 7/17. SBO protocol ordered 7/18 to try to better diagnose SBO vs ileus, patient was able to clear contrast through to colon. Clamping trials were initiated and NGT was eventually removed and diet advanced gradually. Patient unfortunately did not tolerate diet advancement and NGT had to be replaced 7/23. Repeat CT 7/23 with SBO with transition point slightly above the level of the umbilicus. Discussed with patient and patient's wife and ultimately decision was made to return to OR for exploration, this was done 7/26 as listed above. General surgery assumed primary care for this patient at this time. Patient tolerated this procedure well and was returned to the floor post-operatively. He developed post-operative ileus again not unexpectedly. CT ordered 8/1 and showed large intra-abdominal fluid collection anteriorly with small collection in pelvis as well. IR was consulted and placed  percutaneous drain 8/2. 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 Repeat CT done 8/7 for persistent ileus and showed decrease in size of anterior and pelvic collections, left lateral collection that appeared to communicate with anterior collection. Patient regained some bowel function 8/7 and NGT was removed 8/8. Diet was advanced gradually as tolerated and TPN weaned off. Staples were removed from midline incision 8/9, two small tracts in midline wound opened slightly and dressing changes initiated.   On 11/06/21, the patient was voiding well, tolerating diet, ambulating well, pain well controlled, vital signs stable, incisions c/d/i and felt stable for discharge home.  He was sent home with prescription for augmentin. Patient will follow up in our office as listed below as well as with IR and urology.      Allergies as of 11/06/2021   No Known Allergies      Medication List     STOP taking these medications    finasteride 5 MG tablet Commonly known as: PROSCAR   tamsulosin 0.4 MG Caps capsule Commonly known as: FLOMAX       TAKE these medications    amoxicillin-clavulanate 875-125 MG tablet Commonly known as: AUGMENTIN Take 1 tablet by mouth every 12 (twelve) hours.   atorvastatin 80 MG tablet Commonly known as: LIPITOR Take 80 mg by mouth at bedtime.   docusate sodium 100 MG capsule Commonly known as: COLACE Take 1 capsule (100 mg total) by mouth 2 (two) times daily.   traMADol 50 MG tablet Commonly known as: Ultram Take 1 tablet (50 mg total) by mouth every 6 (six) hours as needed for moderate pain or severe pain.               Discharge Care Instructions  (From admission, onward)           Start     Ordered   11/06/21 0000  Discharge wound care:       Comments: Iodophor packing to two openings in  midline incision daily - per wife   11/06/21 2549              Follow-up Information     Karen Kays, NP Follow up on 10/12/2021.   Specialty: Nurse Practitioner Why: at 8:00 Contact information: [address] [place] [address] [phone]         Ileana Roup, MD. Schedule an appointment as soon as possible for a visit on 11/25/2021.   Specialties: General Surgery, Colon and Rectal Surgery Why: 4:10 PM. Please arrive 30 min prior to appointment time. Have ID and insurance information with you. Contact information: [place] [address] [phone]         Corrie Mckusick, DO Follow up.   Specialties: Interventional Radiology, Radiology Contact information: [place] [address] [phone]                 Signed: Margie Billet , [facility] Surgery 11/03/2021, 12:48 PM Please see Amion for pager number during day hours 7:00am-4:30pm

## 2021-11-03 NOTE — Progress Notes (Addendum)
PHARMACY - TOTAL PARENTERAL NUTRITION CONSULT NOTE   Indication: bowel obstruction  Patient Measurements: Height: '5\' 11"'$  (180.3 cm) Weight: 75.2 kg (165 lb 12.6 oz) IBW/kg (Calculated) : 75.3 TPN AdjBW (KG): 68.4 Body mass index is 23.12 kg/m.   Assessment:  66 yo male with hx HTN, HLD, prostate cancer s/p LUTS. S/p robotic assisted laparoscopic radical prostatectomy, bilateral robotic assisted laparoscopic pelvic lymphadenectomy, LOA, oversew of small bowel serosal injuries on 10/04/21.  Patient on TPN 10/11/21-10/16/21. CT on 10/17/21 with SBO with transition slightly above umbilicus. Pharmacy consulted 10/18/21 to resume TPN for SBO. Post-op ileus.  Glucose / Insulin: No hx DM.  A1c 5. - q8h CBGs at goal for 100-150 - 4 units SSI required in past 24 hours Electrolytes: on 8/7: (Na sl low 134), CoCa 10.04, all others WNL Renal: Scr WNL 8/7 Hepatic: 8/7, Alkphos and AST/ALT up from 8/3. Tbili WNL. Albumin low at <1.7 Trig: 93 ( 8/7)  Intake / Output:  I/O:  + 2706 ml. Stool x 3 NGT out 8/8, tolerating clears Abdominal drain: 70 ml 24h  No urine OP recorded last 24 hrs (?)  MIVF: d/c'd GI Meds: IV PPI/24h, IV Reglan '10mg'$ /6hr GI Imaging: 7/16: CT abd pelvis without abscess or fluid collection but concern for small bowel obstruction. 7/18: AXR: Findings are compatible with interval worsening of reported adynamic ileus 7/19: AXR 8hr delay: Findings compatible with un underlying partial small bowel obstruction. Oral contrast has reached the ascending colon >> 24hr delay shows persistent small bowel dilation measuring up to 4.8 cm.  Oral contrast throughout the colon. 7/23 CT abd pelvis with high-grade partial small bowel obstruction with marked dilation of proximal small bowel loops.  7/28 AXR: Gaseous distention of loops of bowel in the left hemiabdomen may reflect ileus or persistent obstruction 7/31: Abd Xray: improving ileus 8/1 CT: recurrent small bowel obstruction versus  postoperative ileus. Large peripherally enhancing fluid collection.Cannot exclude underlying abscess. Smaller fluid collection is noted within the posterior pelvis 8/7 CT AP: anterior IA abscess decreased in size, new abscess left lateral abd, appears to communicate w/ anterior abscess, posterior pelvic abscess sl decr in size; ileus  GI Surgeries / Procedures:  7/10: surgery as per HPI 7/26: exploratory laparotomy with lysis of adhesions, small bowel resection, repair of small bowel serosa, repair of ascending colon serosa  8/2: IR image guided aspiration/drainage of ventral abdominal fluid collection-575m  Central access: PICC 7/17 TPN start date: 7/17-7/22, resumed 7/24  Nutritional Goals:  Goal TPN rate is 90 mL/hr (provides 112 g of protein and 2198 kcals per day)  RD Assessment:  Estimated Needs Total Energy Estimated Needs: 2100-2300 kcal Total Protein Estimated Needs: 105-120 grams Total Fluid Estimated Needs: >/= 2.3 L/day  Current Nutrition:  8/8 clear liquid diet>>FLD 8/9 TPN- cut to 1/2 rate 8/9 @ 1800  Plan:  Decrease TPN rate by 1/2 tonight per CCS TPN @ 45 ml/hr, diet advanced to FLD No change to TPN formula today. Electrolytes in TPN:  Na 70 mEq/L K 50 mEq/L  Ca 3 mEq/L  Mg 7 mEq/L Phos 18 mmol/L Cl:Ac Max acetate Add standard MVI and trace elements to TPN Continue Sensitive q8h SSI and adjust as needed  Monitor TPN labs on Mon/Thurs and PRN F/u for ability to advance po diet & wean TPN  MEudelia Bunch Pharm.D 11/03/2021 8:29 AM Pharmacy: [phone]/11/2021 8:29 AM

## 2021-11-03 NOTE — Progress Notes (Signed)
Patient ID: Ivan Lopez, male   DOB: 1955-10-30, 66 y.o.   MRN: [id number]  14 Days Post-Op Subjective: Pt doing well.  Tolerating full liquids today.  Still having bowel movements.  Objective: Vital signs in last 24 hours: Temp:  [98.4 F (36.9 C)-98.8 F (37.1 C)] 98.4 F (36.9 C) (08/09 1205) Pulse Rate:  [99-103] 99 (08/09 1205) Resp:  [18-20] 19 (08/09 1205) BP: (114-133)/(68-78) 124/75 (08/09 1205) SpO2:  [98 %-100 %] 100 % (08/09 1205)  Intake/Output from previous day: 08/08 0701 - 08/09 0700 In: 2776.3 [I.V.:2156.6; IV Piggyback:609.7] Out: 70 [Drains:70] Intake/Output this shift: Total I/O In: 2508.8 [P.O.:120; I.V.:2048; Other:10; IV Piggyback:330.8] Out: 750 [Urine:750]  Physical Exam:  General: Alert and oriented Abd: Drain with purulent drainage still  Lab Results: Recent Labs    11/01/21 0318 11/02/21 0410 11/03/21 0410  HGB 8.6* 8.7* 8.1*  HCT 26.7* 27.0* 25.8*   BMET Recent Labs    11/01/21 0318  NA 134*  K 4.0  CL 103  CO2 21*  GLUCOSE 130*  BUN 21  CREATININE 0.99  CALCIUM 8.2*     Studies/Results: No results found.  Assessment/Plan: POD # 30 s/p robotic prostatectomy and POD # 14 s/p exploratory laparotomy, adhesiolysis, small bowel resection by General Surgery - Improving with return of bowel function and tolerating diet. Clinical management per General Surgery.  Will continue to monitor.  DVT prophylaxis.  Plan is to hold off on further drainage of collections for now and repeat imaging in a few days.     LOS: 29 days   Ivan Lopez 11/03/2021, 6:13 PM

## 2021-11-03 NOTE — Progress Notes (Addendum)
Progress Note  14 Days Post-Op  Subjective: Continues to have bowel function. Tolerating CLD and NGT was removed yesterday afternoon. Pain well controlled.   Objective: Vital signs in last 24 hours: Temp:  [98.2 F (36.8 C)-98.8 F (37.1 C)] 98.4 F (36.9 C) (08/09 0553) Pulse Rate:  [101-103] 103 (08/09 0553) Resp:  [17-20] 18 (08/09 0553) BP: (114-133)/(68-78) 114/78 (08/09 0553) SpO2:  [98 %-100 %] 99 % (08/09 0553) Last BM Date : 11/02/21  Intake/Output from previous day: 08/08 0701 - 08/09 0700 In: 2776.3 [I.V.:2156.6; IV Piggyback:609.7] Out: 70 [Drains:70] Intake/Output this shift: Total I/O In: 1663.8 [P.O.:120; I.V.:1351.2; IV Piggyback:192.7] Out: 400 [Urine:400]  PE: Gen:  Alert, NAD, pleasant Pulm:  Normal effort Abd: abdomen distended but soft, appropriately ttp, staples removed, 2 tracts opened in midline wound superior is 4 cm with minimal purulent drainage, inferior tract in 2.5 cm with minimal purulent drainage, +BS, Drain with purulent fluid Skin: warm and dry, no rashes  Psych: A&Ox3    Lab Results:  Recent Labs    11/02/21 0410 11/03/21 0410  WBC 23.8* 19.2*  HGB 8.7* 8.1*  HCT 27.0* 25.8*  PLT 708* 740*    BMET Recent Labs    11/01/21 0318  NA 134*  K 4.0  CL 103  CO2 21*  GLUCOSE 130*  BUN 21  CREATININE 0.99  CALCIUM 8.2*    PT/INR No results for input(s): "LABPROT", "INR" in the last 72 hours.  CMP     Component Value Date/Time   NA 134 (L) 11/01/2021 0318   K 4.0 11/01/2021 0318   CL 103 11/01/2021 0318   CO2 21 (L) 11/01/2021 0318   GLUCOSE 130 (H) 11/01/2021 0318   BUN 21 11/01/2021 0318   CREATININE 0.99 11/01/2021 0318   CALCIUM 8.2 (L) 11/01/2021 0318   PROT 6.8 11/01/2021 0318   ALBUMIN 1.7 (L) 11/01/2021 0318   AST 39 11/01/2021 0318   ALT 74 (H) 11/01/2021 0318   ALKPHOS 241 (H) 11/01/2021 0318   BILITOT 0.6 11/01/2021 0318   GFRNONAA >60 11/01/2021 0318   Lipase  No results found for:  "LIPASE"     Studies/Results: CT ABDOMEN PELVIS W CONTRAST  Result Date: 11/01/2021 CLINICAL DATA:  Status post laparoscopic prostatectomy 4 weeks ago and exploratory laparotomy with lysis of adhesions and small-bowel resection 12 days ago, complicated by intra-abdominal abscess requiring IR drain placement five days ago. Continued leukocytosis. EXAM: CT ABDOMEN AND PELVIS WITH CONTRAST TECHNIQUE: Multidetector CT imaging of the abdomen and pelvis was performed using the standard protocol following bolus administration of intravenous contrast. RADIATION DOSE REDUCTION: This exam was performed according to the departmental dose-optimization program which includes automated exposure control, adjustment of the mA and/or kV according to patient size and/or use of iterative reconstruction technique. CONTRAST:  119m OMNIPAQUE IOHEXOL 300 MG/ML  SOLN COMPARISON:  CT abdomen pelvis dated October 26, 2021. FINDINGS: Lower chest: Essentially resolved right pleural effusion. Unchanged small left pleural effusion. Continued bilateral posterior lower lobe atelectasis. Hepatobiliary: Unchanged simple cyst in the central liver. No new focal liver abnormality. Unchanged tiny gallstones. No gallbladder wall thickening or biliary dilatation. Pancreas: Unremarkable. No pancreatic ductal dilatation or surrounding inflammatory changes. Spleen: Normal in size without focal abnormality. Adrenals/Urinary Tract: Adrenal glands are unremarkable. Kidneys are normal, without renal calculi, focal lesion, or hydronephrosis. Bladder is unremarkable. Stomach/Bowel: Multiple dilated loops of small bowel to the level of the anastomosis in the left lower abdomen, similar to prior study. Small bowel distal  to the anastomosis is decompressed. Oral contrast reaches the rectum. The stomach is within normal limits. Vascular/Lymphatic: Aortic atherosclerosis. No enlarged abdominal or pelvic lymph nodes. Reproductive: Status post prostatectomy. Other:  Interval percutaneous drain placement within the large rim enhancing anterior abdominal gas and fluid collection, which has decreased in size, currently measuring approximally 2.2 x 14.7 cm, previously 8.5 x 17.4 cm. New 8.8 x 4.9 cm rim enhancing fluid collection in the left lateral abdomen (series 2, image 58), which appears to communicate with the ventral abdominal fluid collection (series 2, image 55). Posterior pelvic fluid collection has also slightly decreased in size, currently measuring 1.8 x 4.9 cm (series 2, image 85), previously 3.2 x 5.3 cm. Small amount of loculated fluid under the left hemidiaphragm superior to the spleen appears slightly decreased in size compared to prior study. Decreased now trace pneumoperitoneum in the upper abdomen. Musculoskeletal: No acute or significant osseous findings. IMPRESSION: 1. Interval percutaneous drain placement within the large anterior intra-abdominal abscess, which has decreased in size, currently measuring approximally 2.2 x 14.7 cm. 2. New 8.8 x 4.9 cm abscess in the left lateral abdomen, which appears to communicate with the anterior abscess. 3. Posterior pelvic abscess has also slightly decreased in size, currently measuring 1.8 x 4.9 cm. 4. Multiple dilated loops of small bowel to the level of the anastomosis in the left lower abdomen, similar to prior study, consistent with ileus. Oral contrast reaches the rectum. 5. Essentially resolved right pleural effusion. Unchanged small left pleural effusion. 6. Aortic Atherosclerosis (ICD10-I70.0). Electronically Signed   By: Titus Dubin M.D.   On: 11/01/2021 14:55    Anti-infectives: Anti-infectives (From admission, onward)    Start     Dose/Rate Route Frequency Ordered Stop   10/28/21 1000  piperacillin-tazobactam (ZOSYN) IVPB 3.375 g        3.375 g 12.5 mL/hr over 240 Minutes Intravenous Every 8 hours 10/28/21 0904     10/20/21 1000  cefoTEtan (CEFOTAN) 2 g in sodium chloride 0.9 % 100 mL IVPB         2 g 200 mL/hr over 30 Minutes Intravenous  Once 10/19/21 1309 10/20/21 1018   10/20/21 0946  sodium chloride 0.9 % with cefoTEtan (CEFOTAN) ADS Med       Note to Pharmacy: Karsten Ro: cabinet override      10/20/21 0946 10/20/21 0949   10/14/21 1430  sulfamethoxazole-trimethoprim (BACTRIM DS) 800-160 MG per tablet 1 tablet        1 tablet Oral Every 12 hours 10/14/21 1332 10/16/21 2152   10/04/21 2330  ceFAZolin (ANCEF) IVPB 1 g/50 mL premix        1 g 100 mL/hr over 30 Minutes Intravenous Every 8 hours 10/04/21 1737 10/05/21 1014   10/04/21 0922  ceFAZolin (ANCEF) IVPB 2g/100 mL premix        2 g 200 mL/hr over 30 Minutes Intravenous 30 min pre-op 10/04/21 0922 10/04/21 1530   10/04/21 0000  sulfamethoxazole-trimethoprim (BACTRIM DS) 800-160 MG tablet        1 tablet Oral 2 times daily 10/04/21 1048          Assessment/Plan POD#30 - robotic assisted laparoscopic prostatectomy Dr. Alinda Money, and laparoscopic LOA with oversew of small bowel serosal injuries x2 Dr. Brantley Stage 7/10 POD#14 - exploratory laparotomy with LOA and small bowel resection Dr. Dema Severin 7/26 - CT 8/1 with large intra-abdominal fluid collection - s/p IR drain placement 8/2, Cxs with clostridium cadaveris, continue Zosyn, WBC 19K - CT AP yesterday  with decreased size of collections, L lateral collection appears to communicate with anterior, IR recs repeat imaging in 5 days  - advance to FLD and 1/2 TPN - continue mobilization  - staples removed and some purulent drainage from 2 small tracts - BID packing with packing strips  - start working on transition to PRN PO pain meds     FEN: FLD, 1/2 TPN VTE: SQH ID: cefotetan 7/26; Zosyn 8/3>> Foley: out 7/27 and voiding   LOS: 29 days    Norm Parcel, [facility] 11/03/2021, 9:40 AM Please see Amion for pager number during day hours 7:00am-4:30pm    I personally saw the patient and performed a substantive portion of this encounter, including a  complete performance of at least one of the key components (MDM, Hx and/or Exam), in conjunction with the Advanced Practice Provider Barkley Boards, PA-C.  Making some progress- continues to have bowel movements. Abdominal distention decreasing. Drain output remains purulent. Continue slowly advancing diet, mobilizing, etc   Clovis Riley MD FACS 11/03/2021 3:00 PM

## 2021-11-04 LAB — CBC
HCT: 27.9 % — ABNORMAL LOW (ref 39.0–52.0)
Hemoglobin: 8.8 g/dL — ABNORMAL LOW (ref 13.0–17.0)
MCH: 28.5 pg (ref 26.0–34.0)
MCHC: 31.5 g/dL (ref 30.0–36.0)
MCV: 90.3 fL (ref 80.0–100.0)
Platelets: 847 10*3/uL — ABNORMAL HIGH (ref 150–400)
RBC: 3.09 MIL/uL — ABNORMAL LOW (ref 4.22–5.81)
RDW: 16.9 % — ABNORMAL HIGH (ref 11.5–15.5)
WBC: 15.6 10*3/uL — ABNORMAL HIGH (ref 4.0–10.5)
nRBC: 0.4 % — ABNORMAL HIGH (ref 0.0–0.2)

## 2021-11-04 LAB — COMPREHENSIVE METABOLIC PANEL
ALT: 82 U/L — ABNORMAL HIGH (ref 0–44)
AST: 35 U/L (ref 15–41)
Albumin: 1.7 g/dL — ABNORMAL LOW (ref 3.5–5.0)
Alkaline Phosphatase: 248 U/L — ABNORMAL HIGH (ref 38–126)
Anion gap: 7 (ref 5–15)
BUN: 21 mg/dL (ref 8–23)
CO2: 24 mmol/L (ref 22–32)
Calcium: 8.5 mg/dL — ABNORMAL LOW (ref 8.9–10.3)
Chloride: 105 mmol/L (ref 98–111)
Creatinine, Ser: 1.1 mg/dL (ref 0.61–1.24)
GFR, Estimated: >60 mL/min (ref >60)
Glucose, Bld: 112 mg/dL — ABNORMAL HIGH (ref 70–99)
Potassium: 4 mmol/L (ref 3.5–5.1)
Sodium: 136 mmol/L (ref 135–145)
Total Bilirubin: 0.2 mg/dL — ABNORMAL LOW (ref 0.3–1.2)
Total Protein: 7.3 g/dL (ref 6.5–8.1)

## 2021-11-04 LAB — MAGNESIUM: Magnesium: 2.2 mg/dL (ref 1.7–2.4)

## 2021-11-04 LAB — TRIGLYCERIDES: Triglycerides: 115 mg/dL (ref <150)

## 2021-11-04 LAB — PHOSPHORUS: Phosphorus: 3.8 mg/dL (ref 2.5–4.6)

## 2021-11-04 LAB — GLUCOSE, CAPILLARY: Glucose-Capillary: 112 mg/dL — ABNORMAL HIGH (ref 70–99)

## 2021-11-04 MED ORDER — ADULT MULTIVITAMIN W/MINERALS CH
1.0000 | ORAL_TABLET | Freq: Every day | ORAL | Status: DC
  Administered 2021-11-04 – 2021-11-06 (×3): 1 via ORAL
  Filled 2021-11-04 (×3): qty 1

## 2021-11-04 MED ORDER — DIPHENHYDRAMINE HCL 25 MG PO CAPS
25.0000 mg | ORAL_CAPSULE | Freq: Every evening | ORAL | Status: DC | PRN
Start: 2021-11-04 — End: 2021-11-06
  Administered 2021-11-04 – 2021-11-05 (×2): 25 mg via ORAL
  Filled 2021-11-04 (×2): qty 1

## 2021-11-04 MED ORDER — METOCLOPRAMIDE HCL 5 MG/ML IJ SOLN
5.0000 mg | Freq: Four times a day (QID) | INTRAMUSCULAR | Status: DC
  Administered 2021-11-04 – 2021-11-05 (×4): 5 mg via INTRAVENOUS
  Filled 2021-11-04 (×4): qty 2

## 2021-11-04 MED ORDER — BOOST / RESOURCE BREEZE PO LIQD CUSTOM
1.0000 | Freq: Three times a day (TID) | ORAL | Status: DC
  Administered 2021-11-04 – 2021-11-06 (×7): 1 via ORAL

## 2021-11-04 MED ORDER — ALTEPLASE 2 MG IJ SOLR
2.0000 mg | Freq: Once | INTRAMUSCULAR | Status: AC
  Administered 2021-11-04: 2 mg
  Filled 2021-11-04: qty 2

## 2021-11-04 MED ORDER — PANTOPRAZOLE SODIUM 40 MG PO TBEC
40.0000 mg | DELAYED_RELEASE_TABLET | Freq: Every day | ORAL | Status: DC
  Administered 2021-11-04 – 2021-11-05 (×2): 40 mg via ORAL
  Filled 2021-11-04 (×2): qty 1

## 2021-11-04 NOTE — Progress Notes (Signed)
PHARMACY - TOTAL PARENTERAL NUTRITION CONSULT NOTE   Indication: bowel obstruction  Patient Measurements: Height: '5\' 11"'$  (180.3 cm) Weight: 75.2 kg (165 lb 12.6 oz) IBW/kg (Calculated) : 75.3 TPN AdjBW (KG): 68.4 Body mass index is 23.12 kg/m.   Assessment:  66 yo male with hx HTN, HLD, prostate cancer s/p LUTS. S/p robotic assisted laparoscopic radical prostatectomy, bilateral robotic assisted laparoscopic pelvic lymphadenectomy, LOA, oversew of small bowel serosal injuries on 10/04/21.  Patient on TPN 10/11/21-10/16/21. CT on 10/17/21 with SBO with transition slightly above umbilicus. Pharmacy consulted 10/18/21 to resume TPN for SBO. Post-op ileus.  Glucose / Insulin: No hx DM.  A1c 5. - q8h CBGs at goal for 100-150 - 2 units SSI required in past 24 hours Electrolytes:  WNL Renal: Scr WNL Hepatic:  Alkphos 248 and AST/ALT 82/248 .  Tbili WNL. Albumin low at 1.7 Trig: 93 ( 8/7)  Intake / Output:  I/O:  + 259 ml. Stool x 1 NGT out 8/8, tolerating FLD Abdominal drain: 95 ml 24h  UOP 2150/24 hrs MIVF: d/c'd GI Meds: IV PPI/24h> PO 8/10, IV Reglan '10mg'$ /6hr GI Imaging: 7/16: CT abd pelvis without abscess or fluid collection but concern for small bowel obstruction. 7/18: AXR: Findings are compatible with interval worsening of reported adynamic ileus 7/19: AXR 8hr delay: Findings compatible with un underlying partial small bowel obstruction. Oral contrast has reached the ascending colon >> 24hr delay shows persistent small bowel dilation measuring up to 4.8 cm.  Oral contrast throughout the colon. 7/23 CT abd pelvis with high-grade partial small bowel obstruction with marked dilation of proximal small bowel loops.  7/28 AXR: Gaseous distention of loops of bowel in the left hemiabdomen may reflect ileus or persistent obstruction 7/31: Abd Xray: improving ileus 8/1 CT: recurrent small bowel obstruction versus postoperative ileus. Large peripherally enhancing fluid collection.Cannot exclude  underlying abscess. Smaller fluid collection is noted within the posterior pelvis 8/7 CT AP: anterior IA abscess decreased in size, new abscess left lateral abd, appears to communicate w/ anterior abscess, posterior pelvic abscess sl decr in size; ileus  GI Surgeries / Procedures:  7/10: surgery as per HPI 7/26: exploratory laparotomy with lysis of adhesions, small bowel resection, repair of small bowel serosa, repair of ascending colon serosa  8/2: IR image guided aspiration/drainage of ventral abdominal fluid collection-52m  Central access: PICC 7/17 TPN start date: 7/17-7/22, resumed 7/24  Nutritional Goals:  Goal TPN rate is 90 mL/hr (provides 112 g of protein and 2198 kcals per day)  RD Assessment:  Estimated Needs Total Energy Estimated Needs: 2100-2300 kcal Total Protein Estimated Needs: 105-120 grams Total Fluid Estimated Needs: >/= 2.3 L/day  Current Nutrition:  8/8 clear liquid diet>>FLD 8/9> soft diet 8/10 TPN- cut to 1/2 rate 8/9 @ 1800  Plan:  DC TPN at 1800 today when current bag completed diet advanced to soft DC SSI & DC CBGS Change to PO MVI & PO PPI DC TPN labs  MEudelia Bunch Pharm.D 11/04/2021 7:23 AM Pharmacy: [phone]/12/2021 7:23 AM

## 2021-11-04 NOTE — Progress Notes (Signed)
Referring Physician(s): Stechschulte,P  Supervising Physician: Jacqulynn Cadet  Patient Status:  [facility] - In-pt  Chief Complaint: Abdominal pain/post op abscess   Subjective: Pt currently walking in hallway; states he feels better; denies worsening abd pain, N/V   Allergies: Patient has no known allergies.  Medications: Prior to Admission medications   Medication Sig Start Date End Date Taking? Authorizing Provider  atorvastatin (LIPITOR) 80 MG tablet Take 80 mg by mouth at bedtime. 08/19/21  Yes [provider]  docusate sodium (COLACE) 100 MG capsule Take 1 capsule (100 mg total) by mouth 2 (two) times daily. 10/04/21  Yes Dancy, Estill Bamberg, PA-C  finasteride (PROSCAR) 5 MG tablet Take 5 mg by mouth daily. 09/05/21  Yes [provider]  sulfamethoxazole-trimethoprim (BACTRIM DS) 800-160 MG tablet Take 1 tablet by mouth 2 (two) times daily. Start the day prior to foley removal appointment 10/04/21  Yes Dancy, Estill Bamberg, PA-C  tamsulosin (FLOMAX) 0.4 MG CAPS capsule Take 0.4 mg by mouth at bedtime. 08/19/21  Yes [provider]  traMADol (ULTRAM) 50 MG tablet Take 1-2 tablets (50-100 mg total) by mouth every 6 (six) hours as needed for moderate pain or severe pain. 10/04/21  Yes Dancy, Estill Bamberg, PA-C     Vital Signs: BP 129/76 (BP Location: Left Arm)   Pulse (!) 102   Temp 98.5 F (36.9 C) (Oral)   Resp 18   Ht '5\' 11"'$  (1.803 m)   Wt 165 lb 12.6 oz (75.2 kg)   SpO2 99%   BMI 23.12 kg/m   Physical Exam awake/alert; abd drain output 95 cc, drain cont to be irrigated  Imaging: CT ABDOMEN PELVIS W CONTRAST  Result Date: 11/01/2021 CLINICAL DATA:  Status post laparoscopic prostatectomy 4 weeks ago and exploratory laparotomy with lysis of adhesions and small-bowel resection 12 days ago, complicated by intra-abdominal abscess requiring IR drain placement five days ago. Continued leukocytosis. EXAM: CT ABDOMEN AND PELVIS WITH CONTRAST TECHNIQUE: Multidetector CT  imaging of the abdomen and pelvis was performed using the standard protocol following bolus administration of intravenous contrast. RADIATION DOSE REDUCTION: This exam was performed according to the departmental dose-optimization program which includes automated exposure control, adjustment of the mA and/or kV according to patient size and/or use of iterative reconstruction technique. CONTRAST:  155m OMNIPAQUE IOHEXOL 300 MG/ML  SOLN COMPARISON:  CT abdomen pelvis dated October 26, 2021. FINDINGS: Lower chest: Essentially resolved right pleural effusion. Unchanged small left pleural effusion. Continued bilateral posterior lower lobe atelectasis. Hepatobiliary: Unchanged simple cyst in the central liver. No new focal liver abnormality. Unchanged tiny gallstones. No gallbladder wall thickening or biliary dilatation. Pancreas: Unremarkable. No pancreatic ductal dilatation or surrounding inflammatory changes. Spleen: Normal in size without focal abnormality. Adrenals/Urinary Tract: Adrenal glands are unremarkable. Kidneys are normal, without renal calculi, focal lesion, or hydronephrosis. Bladder is unremarkable. Stomach/Bowel: Multiple dilated loops of small bowel to the level of the anastomosis in the left lower abdomen, similar to prior study. Small bowel distal to the anastomosis is decompressed. Oral contrast reaches the rectum. The stomach is within normal limits. Vascular/Lymphatic: Aortic atherosclerosis. No enlarged abdominal or pelvic lymph nodes. Reproductive: Status post prostatectomy. Other: Interval percutaneous drain placement within the large rim enhancing anterior abdominal gas and fluid collection, which has decreased in size, currently measuring approximally 2.2 x 14.7 cm, previously 8.5 x 17.4 cm. New 8.8 x 4.9 cm rim enhancing fluid collection in the left lateral abdomen (series 2, image 58), which appears to communicate with the ventral abdominal fluid  collection (series 2, image 55). Posterior  pelvic fluid collection has also slightly decreased in size, currently measuring 1.8 x 4.9 cm (series 2, image 85), previously 3.2 x 5.3 cm. Small amount of loculated fluid under the left hemidiaphragm superior to the spleen appears slightly decreased in size compared to prior study. Decreased now trace pneumoperitoneum in the upper abdomen. Musculoskeletal: No acute or significant osseous findings. IMPRESSION: 1. Interval percutaneous drain placement within the large anterior intra-abdominal abscess, which has decreased in size, currently measuring approximally 2.2 x 14.7 cm. 2. New 8.8 x 4.9 cm abscess in the left lateral abdomen, which appears to communicate with the anterior abscess. 3. Posterior pelvic abscess has also slightly decreased in size, currently measuring 1.8 x 4.9 cm. 4. Multiple dilated loops of small bowel to the level of the anastomosis in the left lower abdomen, similar to prior study, consistent with ileus. Oral contrast reaches the rectum. 5. Essentially resolved right pleural effusion. Unchanged small left pleural effusion. 6. Aortic Atherosclerosis (ICD10-I70.0). Electronically Signed   By: Titus Dubin M.D.   On: 11/01/2021 14:55    Labs:  CBC: Recent Labs    11/01/21 0318 11/02/21 0410 11/03/21 0410 11/04/21 0645  WBC 22.9* 23.8* 19.2* 15.6*  HGB 8.6* 8.7* 8.1* 8.8*  HCT 26.7* 27.0* 25.8* 27.9*  PLT 717* 708* 740* 847*    COAGS: Recent Labs    10/27/21 0417  INR 1.1    BMP: Recent Labs    10/28/21 0445 10/30/21 0250 11/01/21 0318 11/04/21 0645  NA 135 133* 134* 136  K 4.2 4.0 4.0 4.0  CL 104 100 103 105  CO2 24 26 21* 24  GLUCOSE 143* 132* 130* 112*  BUN 20 24* 21 21  CALCIUM 7.7* 7.9* 8.2* 8.5*  CREATININE 0.87 0.88 0.99 1.10  GFRNONAA >60 >60 >60 >60    LIVER FUNCTION TESTS: Recent Labs    10/25/21 0335 10/28/21 0445 11/01/21 0318 11/04/21 0645  BILITOT 0.5 0.3 0.6 0.2*  AST 99* 20 39 35  ALT 153* 81* 74* 82*  ALKPHOS 237* 182* 241*  248*  PROT 5.5* 5.7* 6.8 7.3  ALBUMIN <1.5* <1.5* 1.7* 1.7*    Assessment and Plan: Patient with history of robotic prostatectomy and laparoscopic lysis of adhesions with oversew of small bowel serosal injury on 7/10 and exploratory lap/lysis of adhesions/small bowel resection 7/26; status post drainage of postop ant abd abscess on 8/2 (12 fr to bag); afebrile,WBC 15.6(19.2), hgb 8.8(8.1), creat nl; pt scheduled for f/u CT tomorrow to eval adequacy of drainage   Electronically Signed: D. Rowe Robert, PA-C 11/04/2021, 3:02 PM   I spent a total of 15 Minutes at the the patient's bedside AND on the patient's hospital floor or unit, greater than 50% of which was counseling/coordinating care for abdominal abscess drain    Patient ID: Ivan Lopez, male   DOB: 08-Sep-1955, 66 y.o.   MRN: [id number]

## 2021-11-04 NOTE — Progress Notes (Signed)
PT Cancellation Note  Patient Details Name: Ivan Lopez MRN: [id number] DOB: 12-01-1955   Cancelled Treatment:     attempted to see in am, pt requested "later".  Came back after 3 pm Spouse had amb pt a great distance in hallway.   Rica Koyanagi  PTA Acute  Rehabilitation Services Office M-F          [phone] Weekend pager [phone]

## 2021-11-04 NOTE — Progress Notes (Signed)
Progress Note  15 Days Post-Op  Subjective: Continues to have bowel function. Tolerated FLD yesterday. Mobilizing more. Wife at bedside this AM.   Objective: Vital signs in last 24 hours: Temp:  [98.4 F (36.9 C)-98.8 F (37.1 C)] 98.8 F (37.1 C) (08/10 0447) Pulse Rate:  [99-102] 102 (08/10 0447) Resp:  [18-19] 18 (08/09 1930) BP: (107-124)/(69-75) 107/70 (08/10 0447) SpO2:  [100 %] 100 % (08/10 0447) Last BM Date : 11/04/21  Intake/Output from previous day: 08/09 0701 - 08/10 0700 In: 2513.8 [P.O.:120; I.V.:2053; IV Piggyback:330.8] Out: 2245 [Urine:2150; Drains:95] Intake/Output this shift: Total I/O In: 679.8 [I.V.:679.8] Out: 250 [Urine:250]  PE: Gen:  Alert, NAD, pleasant Pulm:  Normal effort Abd: abdomen soft, appropriately ttp, midline tracts just packed around 6 AM with packing strips, +BS, Drain with purulent fluid Skin: warm and dry, no rashes  Psych: A&Ox3    Lab Results:  Recent Labs    11/03/21 0410 11/04/21 0645  WBC 19.2* 15.6*  HGB 8.1* 8.8*  HCT 25.8* 27.9*  PLT 740* 847*    BMET Recent Labs    11/04/21 0645  NA 136  K 4.0  CL 105  CO2 24  GLUCOSE 112*  BUN 21  CREATININE 1.10  CALCIUM 8.5*    PT/INR No results for input(s): "LABPROT", "INR" in the last 72 hours.  CMP     Component Value Date/Time   NA 136 11/04/2021 0645   K 4.0 11/04/2021 0645   CL 105 11/04/2021 0645   CO2 24 11/04/2021 0645   GLUCOSE 112 (H) 11/04/2021 0645   BUN 21 11/04/2021 0645   CREATININE 1.10 11/04/2021 0645   CALCIUM 8.5 (L) 11/04/2021 0645   PROT 7.3 11/04/2021 0645   ALBUMIN 1.7 (L) 11/04/2021 0645   AST 35 11/04/2021 0645   ALT 82 (H) 11/04/2021 0645   ALKPHOS 248 (H) 11/04/2021 0645   BILITOT 0.2 (L) 11/04/2021 0645   GFRNONAA >60 11/04/2021 0645   Lipase  No results found for: "LIPASE"     Studies/Results: No results found.  Anti-infectives: Anti-infectives (From admission, onward)    Start     Dose/Rate Route  Frequency Ordered Stop   10/28/21 1000  piperacillin-tazobactam (ZOSYN) IVPB 3.375 g        3.375 g 12.5 mL/hr over 240 Minutes Intravenous Every 8 hours 10/28/21 0904     10/20/21 1000  cefoTEtan (CEFOTAN) 2 g in sodium chloride 0.9 % 100 mL IVPB        2 g 200 mL/hr over 30 Minutes Intravenous  Once 10/19/21 1309 10/20/21 1018   10/20/21 0946  sodium chloride 0.9 % with cefoTEtan (CEFOTAN) ADS Med       Note to Pharmacy: Karsten Ro: cabinet override      10/20/21 0946 10/20/21 0949   10/14/21 1430  sulfamethoxazole-trimethoprim (BACTRIM DS) 800-160 MG per tablet 1 tablet        1 tablet Oral Every 12 hours 10/14/21 1332 10/16/21 2152   10/04/21 2330  ceFAZolin (ANCEF) IVPB 1 g/50 mL premix        1 g 100 mL/hr over 30 Minutes Intravenous Every 8 hours 10/04/21 1737 10/05/21 1014   10/04/21 0922  ceFAZolin (ANCEF) IVPB 2g/100 mL premix        2 g 200 mL/hr over 30 Minutes Intravenous 30 min pre-op 10/04/21 0922 10/04/21 1530   10/04/21 0000  sulfamethoxazole-trimethoprim (BACTRIM DS) 800-160 MG tablet        1 tablet Oral 2 times  daily 10/04/21 1048          Assessment/Plan POD#31 - robotic assisted laparoscopic prostatectomy Dr. Alinda Money, and laparoscopic LOA with oversew of small bowel serosal injuries x2 Dr. Brantley Stage 7/10 POD#15 - exploratory laparotomy with LOA and small bowel resection Dr. Dema Severin 7/26 - CT 8/1 with large intra-abdominal fluid collection - s/p IR drain placement 8/2, Cxs with clostridium cadaveris, continue Zosyn, WBC 19K - CT AP yesterday with decreased size of collections, L lateral collection appears to communicate with anterior, IR recs repeat imaging in 5 days - consider repeat CT tomorrow to evaluate whether collections are all being adequately drained since we are now nearing readiness for discharge otherwise - advance to soft diet and DC TPN, add boost, wean reglan  - continue mobilization  - staples removed 8/9 and some purulent drainage from 2 small  tracts - BID packing with packing strips  - start working on transition to PRN PO pain meds     FEN: soft diet, DC TPN VTE: SQH ID: cefotetan 7/26; Zosyn 8/3>> Foley: out 7/27 and voiding   LOS: 30 days    Norm Parcel, [facility] 11/04/2021, 9:31 AM Please see Amion for pager number during day hours 7:00am-4:30pm

## 2021-11-05 ENCOUNTER — Inpatient Hospital Stay (HOSPITAL_COMMUNITY): Payer: Medicare Other

## 2021-11-05 ENCOUNTER — Encounter (HOSPITAL_COMMUNITY): Payer: Self-pay | Admitting: Urology

## 2021-11-05 ENCOUNTER — Other Ambulatory Visit (HOSPITAL_COMMUNITY): Payer: Self-pay

## 2021-11-05 LAB — CBC
HCT: 28 % — ABNORMAL LOW (ref 39.0–52.0)
Hemoglobin: 8.6 g/dL — ABNORMAL LOW (ref 13.0–17.0)
MCH: 28 pg (ref 26.0–34.0)
MCHC: 30.7 g/dL (ref 30.0–36.0)
MCV: 91.2 fL (ref 80.0–100.0)
Platelets: 798 10*3/uL — ABNORMAL HIGH (ref 150–400)
RBC: 3.07 MIL/uL — ABNORMAL LOW (ref 4.22–5.81)
RDW: 17.2 % — ABNORMAL HIGH (ref 11.5–15.5)
WBC: 14.4 10*3/uL — ABNORMAL HIGH (ref 4.0–10.5)
nRBC: 0.4 % — ABNORMAL HIGH (ref 0.0–0.2)

## 2021-11-05 MED ORDER — SODIUM CHLORIDE (PF) 0.9 % IJ SOLN
INTRAMUSCULAR | Status: AC
  Filled 2021-11-05: qty 50

## 2021-11-05 MED ORDER — IOHEXOL 300 MG/ML  SOLN
100.0000 mL | Freq: Once | INTRAMUSCULAR | Status: AC | PRN
  Administered 2021-11-05: 100 mL via INTRAVENOUS

## 2021-11-05 MED ORDER — NORMAL SALINE FLUSH 0.9 % IV SOLN
INTRAVENOUS | 3 refills | Status: DC
  Filled 2021-11-05: qty 300, 30d supply, fill #0

## 2021-11-05 NOTE — Progress Notes (Signed)
Referring Physician(s): Stechschulte,P  Supervising Physician: Corrie Mckusick  Patient Status:  [facility] - In-pt  Chief Complaint: Post op abdominal abscess   Subjective: Pt resting quietly in bed; no new c/o ; note made of possible dc home this weekend with drain   Allergies: Patient has no known allergies.  Medications: Prior to Admission medications   Medication Sig Start Date End Date Taking? Authorizing Provider  atorvastatin (LIPITOR) 80 MG tablet Take 80 mg by mouth at bedtime. 08/19/21  Yes [provider]  docusate sodium (COLACE) 100 MG capsule Take 1 capsule (100 mg total) by mouth 2 (two) times daily. 10/04/21  Yes Dancy, Estill Bamberg, PA-C  finasteride (PROSCAR) 5 MG tablet Take 5 mg by mouth daily. 09/05/21  Yes [provider]  sulfamethoxazole-trimethoprim (BACTRIM DS) 800-160 MG tablet Take 1 tablet by mouth 2 (two) times daily. Start the day prior to foley removal appointment 10/04/21  Yes Dancy, Estill Bamberg, PA-C  tamsulosin (FLOMAX) 0.4 MG CAPS capsule Take 0.4 mg by mouth at bedtime. 08/19/21  Yes [provider]  traMADol (ULTRAM) 50 MG tablet Take 1-2 tablets (50-100 mg total) by mouth every 6 (six) hours as needed for moderate pain or severe pain. 10/04/21  Yes Dancy, Estill Bamberg, PA-C     Vital Signs: BP 109/61 (BP Location: Left Arm)   Pulse (!) 107   Temp 98.9 F (37.2 C) (Oral)   Resp 20   Ht '5\' 11"'$  (1.803 m)   Wt 165 lb 12.6 oz (75.2 kg)   SpO2 100%   BMI 23.12 kg/m   Physical Exam awake/alert; abd drain intact, dressing clean and dry, mildly tender; OP 75 cc purulent beige colored fluid; drain has been flushed today  Imaging: CT ABDOMEN PELVIS W CONTRAST  Result Date: 11/05/2021 CLINICAL DATA:  Abdominal pain, post-op, follow-up drain EXAM: CT ABDOMEN AND PELVIS WITH CONTRAST TECHNIQUE: Multidetector CT imaging of the abdomen and pelvis was performed using the standard protocol following bolus administration of intravenous contrast.  RADIATION DOSE REDUCTION: This exam was performed according to the departmental dose-optimization program which includes automated exposure control, adjustment of the mA and/or kV according to patient size and/or use of iterative reconstruction technique. CONTRAST:  168m OMNIPAQUE IOHEXOL 300 MG/ML  SOLN COMPARISON:  CT 11/01/2021. FINDINGS: Lower chest: Small left pleural effusion, slightly decreased from prior, with adjacent basilar atelectasis. Mild improvement in right basilar atelectasis. Hepatobiliary: No focal liver abnormality is seen. Cholelithiasis. No acute cholecystitis. Pancreas: Unremarkable. No pancreatic ductal dilatation or surrounding inflammatory changes. Spleen: Normal in size without focal abnormality. Adrenals/Urinary Tract: Adrenal glands are unremarkable. No hydronephrosis or nephrolithiasis. The bladder is moderately distended. Stomach/Bowel: Persistent multiple dilated loops of small bowel to the level of the anastomosis. Dilution of previously administered oral contrast. There are air-fluid levels in the colon. Vascular/Lymphatic: Scattered atherosclerosis. No AAA. No lymphadenopathy. Reproductive: Prior prostatectomy. Other: Anterior lower abdominal rim enhancing fluid collection measures up to 11.3 x 1.8 x 16.5 cm (series 2, image 55, series 6, image 57). This previously measured 14.7 x 2.2 x 19.4 cm. Communicating left hemiabdomen rim enhancing collection measures 5.8 x 2.0 cm, previously 8.8 x 4.9 cm (series 2, image 53). Percutaneous pigtail drainage catheter is in place in appropriate position. Rim enhancing collection in the pelvis measures 3.9 x 1.7 x 2.1 cm, previously 4.9 x 1.8 x 1.9 cm (series 2, image 8, series 6 image 58). Continued decrease in loculated fluid under the left hemidiaphragm adjacent to the spleen. Trace residual foci of  pneumoperitoneum in the anterior upper abdomen, and adjacent to the stomach and lower mediastinum adjacent to the distal esophagus/GE junction.  Musculoskeletal: No acute osseous abnormality. No suspicious osseous lesion. IMPRESSION: Persistent but decreased size of the anterior lower abdominal abscess and communicating left hemiabdomen abscess, measurements above. Percutaneous drainage catheter is in appropriate position. Slight decrease size of the pelvic abscess. Persistent multiple dilated loops of small bowel, most consistent with ileus. Oral contrast material had passed through the anastomosis on the prior CT. Small left pleural effusion, slightly decreased from prior, with persistent adjacent atelectasis. Improved right basilar atelectasis. Electronically Signed   By: Maurine Simmering M.D.   On: 11/05/2021 10:28   CT ABDOMEN PELVIS W CONTRAST  Result Date: 11/01/2021 CLINICAL DATA:  Status post laparoscopic prostatectomy 4 weeks ago and exploratory laparotomy with lysis of adhesions and small-bowel resection 12 days ago, complicated by intra-abdominal abscess requiring IR drain placement five days ago. Continued leukocytosis. EXAM: CT ABDOMEN AND PELVIS WITH CONTRAST TECHNIQUE: Multidetector CT imaging of the abdomen and pelvis was performed using the standard protocol following bolus administration of intravenous contrast. RADIATION DOSE REDUCTION: This exam was performed according to the departmental dose-optimization program which includes automated exposure control, adjustment of the mA and/or kV according to patient size and/or use of iterative reconstruction technique. CONTRAST:  172m OMNIPAQUE IOHEXOL 300 MG/ML  SOLN COMPARISON:  CT abdomen pelvis dated October 26, 2021. FINDINGS: Lower chest: Essentially resolved right pleural effusion. Unchanged small left pleural effusion. Continued bilateral posterior lower lobe atelectasis. Hepatobiliary: Unchanged simple cyst in the central liver. No new focal liver abnormality. Unchanged tiny gallstones. No gallbladder wall thickening or biliary dilatation. Pancreas: Unremarkable. No pancreatic ductal  dilatation or surrounding inflammatory changes. Spleen: Normal in size without focal abnormality. Adrenals/Urinary Tract: Adrenal glands are unremarkable. Kidneys are normal, without renal calculi, focal lesion, or hydronephrosis. Bladder is unremarkable. Stomach/Bowel: Multiple dilated loops of small bowel to the level of the anastomosis in the left lower abdomen, similar to prior study. Small bowel distal to the anastomosis is decompressed. Oral contrast reaches the rectum. The stomach is within normal limits. Vascular/Lymphatic: Aortic atherosclerosis. No enlarged abdominal or pelvic lymph nodes. Reproductive: Status post prostatectomy. Other: Interval percutaneous drain placement within the large rim enhancing anterior abdominal gas and fluid collection, which has decreased in size, currently measuring approximally 2.2 x 14.7 cm, previously 8.5 x 17.4 cm. New 8.8 x 4.9 cm rim enhancing fluid collection in the left lateral abdomen (series 2, image 58), which appears to communicate with the ventral abdominal fluid collection (series 2, image 55). Posterior pelvic fluid collection has also slightly decreased in size, currently measuring 1.8 x 4.9 cm (series 2, image 85), previously 3.2 x 5.3 cm. Small amount of loculated fluid under the left hemidiaphragm superior to the spleen appears slightly decreased in size compared to prior study. Decreased now trace pneumoperitoneum in the upper abdomen. Musculoskeletal: No acute or significant osseous findings. IMPRESSION: 1. Interval percutaneous drain placement within the large anterior intra-abdominal abscess, which has decreased in size, currently measuring approximally 2.2 x 14.7 cm. 2. New 8.8 x 4.9 cm abscess in the left lateral abdomen, which appears to communicate with the anterior abscess. 3. Posterior pelvic abscess has also slightly decreased in size, currently measuring 1.8 x 4.9 cm. 4. Multiple dilated loops of small bowel to the level of the anastomosis in  the left lower abdomen, similar to prior study, consistent with ileus. Oral contrast reaches the rectum. 5. Essentially resolved right pleural effusion.  Unchanged small left pleural effusion. 6. Aortic Atherosclerosis (ICD10-I70.0). Electronically Signed   By: Titus Dubin M.D.   On: 11/01/2021 14:55    Labs:  CBC: Recent Labs    11/02/21 0410 11/03/21 0410 11/04/21 0645 11/05/21 0406  WBC 23.8* 19.2* 15.6* 14.4*  HGB 8.7* 8.1* 8.8* 8.6*  HCT 27.0* 25.8* 27.9* 28.0*  PLT 708* 740* 847* 798*    COAGS: Recent Labs    10/27/21 0417  INR 1.1    BMP: Recent Labs    10/28/21 0445 10/30/21 0250 11/01/21 0318 11/04/21 0645  NA 135 133* 134* 136  K 4.2 4.0 4.0 4.0  CL 104 100 103 105  CO2 24 26 21* 24  GLUCOSE 143* 132* 130* 112*  BUN 20 24* 21 21  CALCIUM 7.7* 7.9* 8.2* 8.5*  CREATININE 0.87 0.88 0.99 1.10  GFRNONAA >60 >60 >60 >60    LIVER FUNCTION TESTS: Recent Labs    10/25/21 0335 10/28/21 0445 11/01/21 0318 11/04/21 0645  BILITOT 0.5 0.3 0.6 0.2*  AST 99* 20 39 35  ALT 153* 81* 74* 82*  ALKPHOS 237* 182* 241* 248*  PROT 5.5* 5.7* 6.8 7.3  ALBUMIN <1.5* <1.5* 1.7* 1.7*    Assessment and Plan: Patient with history of robotic prostatectomy and laparoscopic lysis of adhesions with oversew of small bowel serosal injury on 7/10 and exploratory lap/lysis of adhesions/small bowel resection 7/26; status post drainage of postop ant abd abscess on 8/2 (12 fr to bag); afebrile, WBC 14.4(15.6), hgb stable; f/u CT A/P today:   Persistent but decreased size of the anterior lower abdominal abscess and communicating left hemiabdomen abscess, measurements above. Percutaneous drainage catheter is in appropriate position.   Slight decrease size of the pelvic abscess.   Persistent multiple dilated loops of small bowel, most consistent with ileus. Oral contrast material had passed through the anastomosis on the prior CT.   Small left pleural effusion, slightly  decreased from prior, with persistent adjacent atelectasis. Improved right basilar atelectasis  Cont current tx; as outpatient recommend once daily flushing of abdominal drain with 5 cc sterile saline, output recording and dressing change every 2 to 3 days.  Pt given prescription for saline flushes.  We will schedule follow-up in IR drain clinic in 10-14 days.    Electronically Signed: D. Rowe Robert, PA-C 11/05/2021, 1:56 PM   I spent a total of 15 Minutes at the the patient's bedside AND on the patient's hospital floor or unit, greater than 50% of which was counseling/coordinating care for abdominal abscess drain    Patient ID: Ivan Lopez, male   DOB: 29-Jan-1956, 66 y.o.   MRN: [id number]

## 2021-11-05 NOTE — Progress Notes (Signed)
Nutrition Follow-up  INTERVENTION:   -Boost Breeze po TID, each supplement provides 250 kcal and 9 grams of protein  NUTRITION DIAGNOSIS:   Inadequate oral intake related to acute illness as evidenced by other (comment) (CLD and need for NGT to LIS).  Now on soft diet.  GOAL:   Patient will meet greater than or equal to 90% of their needs  Progressing.  MONITOR:   PO intake, Supplement acceptance, Labs, Weight trends, I & O's   ASSESSMENT:   66 year-old male with medical history. On 7/10 he underwent robotic-assisted lap radical prostatectomy with pelvic lymphadenectomy. Medical course complicated by ileus with need for NGT placement for decompression and TPN initiation.  Significant Events: 7/10 admitted 7/13: NGT placed, set to suction 7/17: double lumen PICC placed in R brachial; TPN initiated 7/21: diet advanced to Soft  7/22: TPN stopped 7/23: NGT replaced, set to suction; diet downgraded to CLD 7/24: diet downgraded to NPO 7/25: TPN re-started 7/26: s/p ex lap, LOA, SB resection, repair of SB serosa, repair of ascending colon serosa 8/2: drain placement in IR 8/8: CLD 8/9: FLD 8/10: TPN weaned off, Soft diet ordered  Currently consuming 50-100% of meals at this time. Boost Breeze supplements have been ordered, will continue as accepting.  Admission weight: 158 lbs Last weight 7/30: 165 lbs  Medications: Multivitamin with minerals daily  Labs reviewed: CBGs: 112-153   Diet Order:   Diet Order             DIET SOFT Room service appropriate? Yes; Fluid consistency: Thin  Diet effective now                   EDUCATION NEEDS:   Education needs have been addressed  Skin:  Skin Assessment: Skin Integrity Issues: Skin Integrity Issues:: Incisions Incisions: upper umbilicus (1/60) and abdomen x2 (7/26)  Last BM:  8/10 -type 7  Height:   Ht Readings from Last 1 Encounters:  10/20/21 '5\' 11"'$  (1.803 m)    Weight:   Wt Readings from Last 1  Encounters:  10/24/21 75.2 kg    BMI:  Body mass index is 23.12 kg/m.  Estimated Nutritional Needs:   Kcal:  2100-2300 kcal  Protein:  105-120 grams  Fluid:  >/= 2.3 L/day   Clayton Bibles, MS, RD, LDN Inpatient Clinical Dietitian Contact information available via Amion

## 2021-11-05 NOTE — Plan of Care (Signed)
  Problem: Clinical Measurements: Goal: Ability to maintain clinical measurements within normal limits will improve Outcome: Progressing   Problem: Safety: Goal: Ability to remain free from injury will improve Outcome: Progressing   Problem: Skin Integrity: Goal: Risk for impaired skin integrity will decrease Outcome: Progressing   Problem: Pain Management: Goal: General experience of comfort will improve Outcome: Progressing   Problem: Skin Integrity: Goal: Demonstration of wound healing without infection will improve Outcome: Progressing   Problem: Nutritional: Goal: Maintenance of adequate nutrition will improve Outcome: Progressing

## 2021-11-05 NOTE — Progress Notes (Signed)
Progress Note  16 Days Post-Op  Subjective: Continues to have bowel function. Tolerating soft diet and off TPN. Pain well controlled.   Objective: Vital signs in last 24 hours: Temp:  [98.3 F (36.8 C)-98.7 F (37.1 C)] 98.3 F (36.8 C) (08/11 0415) Pulse Rate:  [92-102] 92 (08/11 0415) Resp:  [18] 18 (08/11 0415) BP: (104-129)/(63-76) 104/63 (08/11 0415) SpO2:  [98 %-99 %] 98 % (08/11 0415) Last BM Date : 11/04/21  Intake/Output from previous day: 08/10 0701 - 08/11 0700 In: 1759.8 [P.O.:420; I.V.:1100.8; IV Piggyback:209] Out: 675 [Urine:600; Drains:75] Intake/Output this shift: No intake/output data recorded.  PE: Gen:  Alert, NAD, pleasant Pulm:  Normal effort Abd: abdomen soft, appropriately ttp, midline tracts just packed around 5 AM with packing strips and some purulent drainage present, +BS, Drain with purulent fluid Skin: warm and dry, no rashes  Psych: A&Ox3    Lab Results:  Recent Labs    11/04/21 0645 11/05/21 0406  WBC 15.6* 14.4*  HGB 8.8* 8.6*  HCT 27.9* 28.0*  PLT 847* 798*    BMET Recent Labs    11/04/21 0645  NA 136  K 4.0  CL 105  CO2 24  GLUCOSE 112*  BUN 21  CREATININE 1.10  CALCIUM 8.5*    PT/INR No results for input(s): "LABPROT", "INR" in the last 72 hours.  CMP     Component Value Date/Time   NA 136 11/04/2021 0645   K 4.0 11/04/2021 0645   CL 105 11/04/2021 0645   CO2 24 11/04/2021 0645   GLUCOSE 112 (H) 11/04/2021 0645   BUN 21 11/04/2021 0645   CREATININE 1.10 11/04/2021 0645   CALCIUM 8.5 (L) 11/04/2021 0645   PROT 7.3 11/04/2021 0645   ALBUMIN 1.7 (L) 11/04/2021 0645   AST 35 11/04/2021 0645   ALT 82 (H) 11/04/2021 0645   ALKPHOS 248 (H) 11/04/2021 0645   BILITOT 0.2 (L) 11/04/2021 0645   GFRNONAA >60 11/04/2021 0645   Lipase  No results found for: "LIPASE"     Studies/Results: CT ABDOMEN PELVIS W CONTRAST  Result Date: 11/05/2021 CLINICAL DATA:  Abdominal pain, post-op, follow-up drain EXAM: CT  ABDOMEN AND PELVIS WITH CONTRAST TECHNIQUE: Multidetector CT imaging of the abdomen and pelvis was performed using the standard protocol following bolus administration of intravenous contrast. RADIATION DOSE REDUCTION: This exam was performed according to the departmental dose-optimization program which includes automated exposure control, adjustment of the mA and/or kV according to patient size and/or use of iterative reconstruction technique. CONTRAST:  166m OMNIPAQUE IOHEXOL 300 MG/ML  SOLN COMPARISON:  CT 11/01/2021. FINDINGS: Lower chest: Small left pleural effusion, slightly decreased from prior, with adjacent basilar atelectasis. Mild improvement in right basilar atelectasis. Hepatobiliary: No focal liver abnormality is seen. Cholelithiasis. No acute cholecystitis. Pancreas: Unremarkable. No pancreatic ductal dilatation or surrounding inflammatory changes. Spleen: Normal in size without focal abnormality. Adrenals/Urinary Tract: Adrenal glands are unremarkable. No hydronephrosis or nephrolithiasis. The bladder is moderately distended. Stomach/Bowel: Persistent multiple dilated loops of small bowel to the level of the anastomosis. Dilution of previously administered oral contrast. There are air-fluid levels in the colon. Vascular/Lymphatic: Scattered atherosclerosis. No AAA. No lymphadenopathy. Reproductive: Prior prostatectomy. Other: Anterior lower abdominal rim enhancing fluid collection measures up to 11.3 x 1.8 x 16.5 cm (series 2, image 55, series 6, image 57). This previously measured 14.7 x 2.2 x 19.4 cm. Communicating left hemiabdomen rim enhancing collection measures 5.8 x 2.0 cm, previously 8.8 x 4.9 cm (series 2, image 53). Percutaneous  pigtail drainage catheter is in place in appropriate position. Rim enhancing collection in the pelvis measures 3.9 x 1.7 x 2.1 cm, previously 4.9 x 1.8 x 1.9 cm (series 2, image 8, series 6 image 58). Continued decrease in loculated fluid under the left  hemidiaphragm adjacent to the spleen. Trace residual foci of pneumoperitoneum in the anterior upper abdomen, and adjacent to the stomach and lower mediastinum adjacent to the distal esophagus/GE junction. Musculoskeletal: No acute osseous abnormality. No suspicious osseous lesion. IMPRESSION: Persistent but decreased size of the anterior lower abdominal abscess and communicating left hemiabdomen abscess, measurements above. Percutaneous drainage catheter is in appropriate position. Slight decrease size of the pelvic abscess. Persistent multiple dilated loops of small bowel, most consistent with ileus. Oral contrast material had passed through the anastomosis on the prior CT. Small left pleural effusion, slightly decreased from prior, with persistent adjacent atelectasis. Improved right basilar atelectasis. Electronically Signed   By: Maurine Simmering M.D.   On: 11/05/2021 10:28    Anti-infectives: Anti-infectives (From admission, onward)    Start     Dose/Rate Route Frequency Ordered Stop   10/28/21 1000  piperacillin-tazobactam (ZOSYN) IVPB 3.375 g        3.375 g 12.5 mL/hr over 240 Minutes Intravenous Every 8 hours 10/28/21 0904     10/20/21 1000  cefoTEtan (CEFOTAN) 2 g in sodium chloride 0.9 % 100 mL IVPB        2 g 200 mL/hr over 30 Minutes Intravenous  Once 10/19/21 1309 10/20/21 1018   10/20/21 0946  sodium chloride 0.9 % with cefoTEtan (CEFOTAN) ADS Med       Note to Pharmacy: Karsten Ro: cabinet override      10/20/21 0946 10/20/21 0949   10/14/21 1430  sulfamethoxazole-trimethoprim (BACTRIM DS) 800-160 MG per tablet 1 tablet        1 tablet Oral Every 12 hours 10/14/21 1332 10/16/21 2152   10/04/21 2330  ceFAZolin (ANCEF) IVPB 1 g/50 mL premix        1 g 100 mL/hr over 30 Minutes Intravenous Every 8 hours 10/04/21 1737 10/05/21 1014   10/04/21 0922  ceFAZolin (ANCEF) IVPB 2g/100 mL premix        2 g 200 mL/hr over 30 Minutes Intravenous 30 min pre-op 10/04/21 6270 10/04/21 1530    10/04/21 0000  sulfamethoxazole-trimethoprim (BACTRIM DS) 800-160 MG tablet        1 tablet Oral 2 times daily 10/04/21 1048          Assessment/Plan POD#32 - robotic assisted laparoscopic prostatectomy Dr. Alinda Money, and laparoscopic LOA with oversew of small bowel serosal injuries x2 Dr. Brantley Stage 7/10 POD#16 - exploratory laparotomy with LOA and small bowel resection Dr. Dema Severin 7/26 - CT 8/1 with large intra-abdominal fluid collection  - s/p IR drain placement 8/2, Cxs with clostridium cadaveris, continue Zosyn, WBC down to 14K - discussed with pharmacy and Augmentin should provide appropriate coverage for this upon discharge - CT AP yesterday with decreased size of collections, L lateral collection appears to communicate with anterior - CT today with persistent but decreased size in anterior and left abdominal collections, decrease in size of pelvic abscess as well - cont soft diet and boost, DC reglan  - continue mobilization  - staples removed 8/9 and some purulent drainage from 2 small tracts - BID packing with packing strips  - drain teaching and teaching on packing for wound today - likely ready for discharge in the next 24-48 hrs    FEN:  soft diet VTE: SQH ID: cefotetan 7/26; Zosyn 8/3>> Foley: out 7/27 and voiding   LOS: 31 days    Norm Parcel, [facility] Surgery 11/05/2021, 11:11 AM Please see Amion for pager number during day hours 7:00am-4:30pm

## 2021-11-05 NOTE — Progress Notes (Signed)
Patient ID: Ivan Lopez, male   DOB: [birth year], 66 y.o.   MRN: [id number]  16 Days Post-Op Subjective: He continues to do well eating and tolerating diet.  Passing flatus and having BMs.  Objective: Vital signs in last 24 hours: Temp:  [98.3 F (36.8 C)-98.9 F (37.2 C)] 98.9 F (37.2 C) (08/11 1339) Pulse Rate:  [92-107] 107 (08/11 1339) Resp:  [18-20] 20 (08/11 1339) BP: (104-111)/(61-66) 109/61 (08/11 1339) SpO2:  [98 %-100 %] 100 % (08/11 1339)  Intake/Output from previous day: 08/10 0701 - 08/11 0700 In: 1759.8 [P.O.:420; I.V.:1100.8; IV Piggyback:209] Out: 675 [Urine:600; Drains:75] Intake/Output this shift: Total I/O In: 245 [P.O.:240; I.V.:5] Out: -   Physical Exam:  General: Alert and oriented Abd: Drain still with copious purulent drainage  Lab Results: Recent Labs    11/03/21 0410 11/04/21 0645 11/05/21 0406  HGB 8.1* 8.8* 8.6*  HCT 25.8* 27.9* 28.0*      Latest Ref Rng & Units 11/05/2021    4:06 AM 11/04/2021    6:45 AM 11/03/2021    4:10 AM  CBC  WBC 4.0 - 10.5 K/uL 14.4  15.6  19.2   Hemoglobin 13.0 - 17.0 g/dL 8.6  8.8  8.1   Hematocrit 39.0 - 52.0 % 28.0  27.9  25.8   Platelets 150 - 400 K/uL 798  847  740      BMET Recent Labs    11/04/21 0645  NA 136  K 4.0  CL 105  CO2 24  GLUCOSE 112*  BUN 21  CREATININE 1.10  CALCIUM 8.5*     Studies/Results: CT ABDOMEN PELVIS W CONTRAST  Result Date: 11/05/2021 CLINICAL DATA:  Abdominal pain, post-op, follow-up drain EXAM: CT ABDOMEN AND PELVIS WITH CONTRAST TECHNIQUE: Multidetector CT imaging of the abdomen and pelvis was performed using the standard protocol following bolus administration of intravenous contrast. RADIATION DOSE REDUCTION: This exam was performed according to the departmental dose-optimization program which includes automated exposure control, adjustment of the mA and/or kV according to patient size and/or use of iterative reconstruction technique. CONTRAST:  163m OMNIPAQUE  IOHEXOL 300 MG/ML  SOLN COMPARISON:  CT 11/01/2021. FINDINGS: Lower chest: Small left pleural effusion, slightly decreased from prior, with adjacent basilar atelectasis. Mild improvement in right basilar atelectasis. Hepatobiliary: No focal liver abnormality is seen. Cholelithiasis. No acute cholecystitis. Pancreas: Unremarkable. No pancreatic ductal dilatation or surrounding inflammatory changes. Spleen: Normal in size without focal abnormality. Adrenals/Urinary Tract: Adrenal glands are unremarkable. No hydronephrosis or nephrolithiasis. The bladder is moderately distended. Stomach/Bowel: Persistent multiple dilated loops of small bowel to the level of the anastomosis. Dilution of previously administered oral contrast. There are air-fluid levels in the colon. Vascular/Lymphatic: Scattered atherosclerosis. No AAA. No lymphadenopathy. Reproductive: Prior prostatectomy. Other: Anterior lower abdominal rim enhancing fluid collection measures up to 11.3 x 1.8 x 16.5 cm (series 2, image 55, series 6, image 57). This previously measured 14.7 x 2.2 x 19.4 cm. Communicating left hemiabdomen rim enhancing collection measures 5.8 x 2.0 cm, previously 8.8 x 4.9 cm (series 2, image 53). Percutaneous pigtail drainage catheter is in place in appropriate position. Rim enhancing collection in the pelvis measures 3.9 x 1.7 x 2.1 cm, previously 4.9 x 1.8 x 1.9 cm (series 2, image 8, series 6 image 58). Continued decrease in loculated fluid under the left hemidiaphragm adjacent to the spleen. Trace residual foci of pneumoperitoneum in the anterior upper abdomen, and adjacent to the stomach and lower mediastinum adjacent to the distal esophagus/GE junction. Musculoskeletal: No  acute osseous abnormality. No suspicious osseous lesion. IMPRESSION: Persistent but decreased size of the anterior lower abdominal abscess and communicating left hemiabdomen abscess, measurements above. Percutaneous drainage catheter is in appropriate position.  Slight decrease size of the pelvic abscess. Persistent multiple dilated loops of small bowel, most consistent with ileus. Oral contrast material had passed through the anastomosis on the prior CT. Small left pleural effusion, slightly decreased from prior, with persistent adjacent atelectasis. Improved right basilar atelectasis. Electronically Signed   By: Maurine Simmering M.D.   On: 11/05/2021 10:28    Assessment/Plan: POD # 11 s/p robotic prostatectomy and POD # 16 s/p exploratory laparotomy, adhesiolysis, small bowel resection by General Surgery - Continuing to improve clinically.  Off TPN and tolerating diet.  CT suggests that all abscess cavities are communicating with current drain.  Plan per General Surgery but hopefully will be discharged home soon with outpatient follow up.  I have our office working on urologic follow up appt in about 6-8 weeks.     LOS: 31 days   [name] 11/05/2021, 5:24 PM

## 2021-11-06 ENCOUNTER — Other Ambulatory Visit (HOSPITAL_COMMUNITY): Payer: Self-pay

## 2021-11-06 LAB — CBC
HCT: 28.3 % — ABNORMAL LOW (ref 39.0–52.0)
Hemoglobin: 8.9 g/dL — ABNORMAL LOW (ref 13.0–17.0)
MCH: 28.6 pg (ref 26.0–34.0)
MCHC: 31.4 g/dL (ref 30.0–36.0)
MCV: 91 fL (ref 80.0–100.0)
Platelets: 843 10*3/uL — ABNORMAL HIGH (ref 150–400)
RBC: 3.11 MIL/uL — ABNORMAL LOW (ref 4.22–5.81)
RDW: 16.7 % — ABNORMAL HIGH (ref 11.5–15.5)
WBC: 12.8 10*3/uL — ABNORMAL HIGH (ref 4.0–10.5)
nRBC: 0.2 % (ref 0.0–0.2)

## 2021-11-06 LAB — BASIC METABOLIC PANEL
Anion gap: 9 (ref 5–15)
BUN: 25 mg/dL — ABNORMAL HIGH (ref 8–23)
CO2: 22 mmol/L (ref 22–32)
Calcium: 8.5 mg/dL — ABNORMAL LOW (ref 8.9–10.3)
Chloride: 104 mmol/L (ref 98–111)
Creatinine, Ser: 1.24 mg/dL (ref 0.61–1.24)
GFR, Estimated: >60 mL/min (ref >60)
Glucose, Bld: 116 mg/dL — ABNORMAL HIGH (ref 70–99)
Potassium: 3.7 mmol/L (ref 3.5–5.1)
Sodium: 135 mmol/L (ref 135–145)

## 2021-11-06 LAB — MAGNESIUM: Magnesium: 2 mg/dL (ref 1.7–2.4)

## 2021-11-06 MED ORDER — TRAMADOL HCL 50 MG PO TABS
50.0000 mg | ORAL_TABLET | Freq: Four times a day (QID) | ORAL | 0 refills | Status: DC | PRN
  Filled 2021-11-06: qty 20, 5d supply, fill #0

## 2021-11-06 MED ORDER — TRAMADOL HCL 50 MG PO TABS: 50.0000 mg | ORAL_TABLET | Freq: Four times a day (QID) | ORAL | 0 refills | Status: AC | PRN

## 2021-11-06 MED ORDER — AMOXICILLIN-POT CLAVULANATE 875-125 MG PO TABS
1.0000 | ORAL_TABLET | Freq: Two times a day (BID) | ORAL | 0 refills | Status: DC
  Filled 2021-11-06: qty 20, 10d supply, fill #0

## 2021-11-06 MED ORDER — AMOXICILLIN-POT CLAVULANATE 875-125 MG PO TABS: 1.0000 | ORAL_TABLET | Freq: Two times a day (BID) | ORAL | 0 refills | Status: AC

## 2021-11-06 MED ORDER — DOCUSATE SODIUM 100 MG PO CAPS: 100.0000 mg | ORAL_CAPSULE | Freq: Two times a day (BID) | ORAL | Status: AC

## 2021-11-06 MED ORDER — AMOXICILLIN-POT CLAVULANATE 875-125 MG PO TABS
1.0000 | ORAL_TABLET | Freq: Two times a day (BID) | ORAL | Status: DC
  Administered 2021-11-06: 1 via ORAL
  Filled 2021-11-06: qty 1

## 2021-11-06 NOTE — TOC Transition Note (Signed)
Transition of Care [facility]) - CM/SW Discharge Note   Patient Details  Name: Jermie Hippe MRN: [id number] Date of Birth: 1955-12-17  [facility] CM/SW Contact:  Ross Ludwig, LCSW Phone Number: 11/06/2021, 2:17 PM   Clinical Narrative:     Patient does not need TPN now, he is discharging back home with oral antibiotics.   Final next level of care: Home/Self Care Barriers to Discharge: Barriers Resolved   Patient Goals and CMS Choice Patient states their goals for this hospitalization and ongoing recovery are:: To return back home. CMS Medicare.gov Compare Post Acute Care list provided to:: Patient Represenative (must comment) ([name](spouse)) Choice offered to / list presented to : Spouse  Discharge Placement                       Discharge Plan and Services   Discharge Planning Services: CM Consult                                 Social Determinants of Health (SDOH) Interventions     Readmission Risk Interventions     No data to display

## 2021-11-06 NOTE — Progress Notes (Signed)
17 Days Post-Op   Subjective/Chief Complaint: Patient in good spirits - eager to go home Tolerating diet + BM Pain controlled with Tramadol WBC improving   Objective: Vital signs in last 24 hours: Temp:  [98.3 F (36.8 C)-98.9 F (37.2 C)] 98.3 F (36.8 C) (08/12 0445) Pulse Rate:  [96-107] 96 (08/12 0445) Resp:  [14-20] 16 (08/12 0445) BP: (109-115)/(61-79) 115/78 (08/12 0445) SpO2:  [100 %] 100 % (08/12 0445) Last BM Date : 11/04/21  Intake/Output from previous day: 08/11 0701 - 08/12 0700 In: 245 [P.O.:240; I.V.:5] Out: 1575 [Urine:1575] Intake/Output this shift: No intake/output data recorded.  General appearance: alert, cooperative, and no distress GI: soft, mild incisional tenderness; two open midline tracts packed with iodophor strips with minimal drainage Drain with purulent fluid in tubing  Lab Results:  Recent Labs    11/05/21 0406 11/06/21 0323  WBC 14.4* 12.8*  HGB 8.6* 8.9*  HCT 28.0* 28.3*  PLT 798* 843*   BMET Recent Labs    11/04/21 0645 11/06/21 0323  NA 136 135  K 4.0 3.7  CL 105 104  CO2 24 22  GLUCOSE 112* 116*  BUN 21 25*  CREATININE 1.10 1.24  CALCIUM 8.5* 8.5*   PT/INR No results for input(s): "LABPROT", "INR" in the last 72 hours. ABG No results for input(s): "PHART", "HCO3" in the last 72 hours.  Invalid input(s): "PCO2", "PO2"  Studies/Results: CT ABDOMEN PELVIS W CONTRAST  Result Date: 11/05/2021 CLINICAL DATA:  Abdominal pain, post-op, follow-up drain EXAM: CT ABDOMEN AND PELVIS WITH CONTRAST TECHNIQUE: Multidetector CT imaging of the abdomen and pelvis was performed using the standard protocol following bolus administration of intravenous contrast. RADIATION DOSE REDUCTION: This exam was performed according to the departmental dose-optimization program which includes automated exposure control, adjustment of the mA and/or kV according to patient size and/or use of iterative reconstruction technique. CONTRAST:  162m  OMNIPAQUE IOHEXOL 300 MG/ML  SOLN COMPARISON:  CT 11/01/2021. FINDINGS: Lower chest: Small left pleural effusion, slightly decreased from prior, with adjacent basilar atelectasis. Mild improvement in right basilar atelectasis. Hepatobiliary: No focal liver abnormality is seen. Cholelithiasis. No acute cholecystitis. Pancreas: Unremarkable. No pancreatic ductal dilatation or surrounding inflammatory changes. Spleen: Normal in size without focal abnormality. Adrenals/Urinary Tract: Adrenal glands are unremarkable. No hydronephrosis or nephrolithiasis. The bladder is moderately distended. Stomach/Bowel: Persistent multiple dilated loops of small bowel to the level of the anastomosis. Dilution of previously administered oral contrast. There are air-fluid levels in the colon. Vascular/Lymphatic: Scattered atherosclerosis. No AAA. No lymphadenopathy. Reproductive: Prior prostatectomy. Other: Anterior lower abdominal rim enhancing fluid collection measures up to 11.3 x 1.8 x 16.5 cm (series 2, image 55, series 6, image 57). This previously measured 14.7 x 2.2 x 19.4 cm. Communicating left hemiabdomen rim enhancing collection measures 5.8 x 2.0 cm, previously 8.8 x 4.9 cm (series 2, image 53). Percutaneous pigtail drainage catheter is in place in appropriate position. Rim enhancing collection in the pelvis measures 3.9 x 1.7 x 2.1 cm, previously 4.9 x 1.8 x 1.9 cm (series 2, image 8, series 6 image 58). Continued decrease in loculated fluid under the left hemidiaphragm adjacent to the spleen. Trace residual foci of pneumoperitoneum in the anterior upper abdomen, and adjacent to the stomach and lower mediastinum adjacent to the distal esophagus/GE junction. Musculoskeletal: No acute osseous abnormality. No suspicious osseous lesion. IMPRESSION: Persistent but decreased size of the anterior lower abdominal abscess and communicating left hemiabdomen abscess, measurements above. Percutaneous drainage catheter is in appropriate  position. Slight  decrease size of the pelvic abscess. Persistent multiple dilated loops of small bowel, most consistent with ileus. Oral contrast material had passed through the anastomosis on the prior CT. Small left pleural effusion, slightly decreased from prior, with persistent adjacent atelectasis. Improved right basilar atelectasis. Electronically Signed   By: Maurine Simmering M.D.   On: 11/05/2021 10:28    Anti-infectives: Anti-infectives (From admission, onward)    Start     Dose/Rate Route Frequency Ordered Stop   11/06/21 1000  amoxicillin-clavulanate (AUGMENTIN) 875-125 MG per tablet 1 tablet        1 tablet Oral Every 12 hours 11/06/21 0846 11/16/21 0959   10/28/21 1000  piperacillin-tazobactam (ZOSYN) IVPB 3.375 g        3.375 g 12.5 mL/hr over 240 Minutes Intravenous Every 8 hours 10/28/21 0904     10/20/21 1000  cefoTEtan (CEFOTAN) 2 g in sodium chloride 0.9 % 100 mL IVPB        2 g 200 mL/hr over 30 Minutes Intravenous  Once 10/19/21 1309 10/20/21 1018   10/20/21 0946  sodium chloride 0.9 % with cefoTEtan (CEFOTAN) ADS Med       Note to Pharmacy: Karsten Ro: cabinet override      10/20/21 0946 10/20/21 0949   10/14/21 1430  sulfamethoxazole-trimethoprim (BACTRIM DS) 800-160 MG per tablet 1 tablet        1 tablet Oral Every 12 hours 10/14/21 1332 10/16/21 2152   10/04/21 2330  ceFAZolin (ANCEF) IVPB 1 g/50 mL premix        1 g 100 mL/hr over 30 Minutes Intravenous Every 8 hours 10/04/21 1737 10/05/21 1014   10/04/21 0922  ceFAZolin (ANCEF) IVPB 2g/100 mL premix        2 g 200 mL/hr over 30 Minutes Intravenous 30 min pre-op 10/04/21 8366 10/04/21 1530   10/04/21 0000  sulfamethoxazole-trimethoprim (BACTRIM DS) 800-160 MG tablet  Status:  Discontinued        1 tablet Oral 2 times daily 10/04/21 1048 11/06/21        Assessment/Plan: POD#33 - robotic assisted laparoscopic prostatectomy Dr. Alinda Money, and laparoscopic LOA with oversew of small bowel serosal injuries x2 Dr.  Brantley Stage 7/10 POD#17 - exploratory laparotomy with LOA and small bowel resection Dr. Dema Severin 7/26 - CT 8/1 with large intra-abdominal fluid collection  - s/p IR drain placement 8/2, Cxs with clostridium cadaveris, continue Zosyn, WBC down to 14K - discussed with pharmacy and Augmentin should provide appropriate coverage for this upon discharge - CT AP 8/7  with decreased size of collections, L lateral collection appears to communicate with anterior - CT 8/11 with persistent but decreased size in anterior and left abdominal collections, decrease in size of pelvic abscess as well - cont soft diet and boost,  - continue mobilization  - staples removed 8/9 and some purulent drainage from 2 small tracts - BID packing with packing strips  - drain teaching and teaching on packing for wound today - Discharge today on Augmentin/ PRN tramadol   FEN: soft diet VTE: SQH ID: cefotetan 7/26; Zosyn 8/3>> Foley: out 7/27 and voiding   LOS: 32 days    Maia Petties 11/06/2021

## 2021-11-08 ENCOUNTER — Other Ambulatory Visit: Payer: Self-pay | Admitting: Surgery

## 2021-11-08 DIAGNOSIS — T8143XA Infection following a procedure, organ and space surgical site, initial encounter: Secondary | ICD-10-CM

## 2021-11-16 ENCOUNTER — Ambulatory Visit
Admission: RE | Admit: 2021-11-16 | Discharge: 2021-11-16 | Disposition: A | Discharge status: Home / Self Care | Payer: Medicare Other | Source: Ambulatory Visit | Attending: Surgery | Admitting: Surgery

## 2021-11-16 ENCOUNTER — Ambulatory Visit
Admission: RE | Admit: 2021-11-16 | Discharge: 2021-11-16 | Disposition: A | Discharge status: Home / Self Care | Payer: Medicare Other | Source: Ambulatory Visit | Attending: Radiology | Admitting: Radiology

## 2021-11-16 ENCOUNTER — Other Ambulatory Visit: Payer: Self-pay | Admitting: Surgery

## 2021-11-16 DIAGNOSIS — T8143XA Infection following a procedure, organ and space surgical site, initial encounter: Secondary | ICD-10-CM

## 2021-11-16 DIAGNOSIS — K8020 Calculus of gallbladder without cholecystitis without obstruction: Secondary | ICD-10-CM | POA: Diagnosis not present

## 2021-11-16 DIAGNOSIS — K7689 Other specified diseases of liver: Secondary | ICD-10-CM | POA: Diagnosis not present

## 2021-11-16 DIAGNOSIS — K651 Peritoneal abscess: Secondary | ICD-10-CM

## 2021-11-16 DIAGNOSIS — K6811 Postprocedural retroperitoneal abscess: Secondary | ICD-10-CM | POA: Diagnosis not present

## 2021-11-16 DIAGNOSIS — Z4682 Encounter for fitting and adjustment of non-vascular catheter: Secondary | ICD-10-CM | POA: Diagnosis not present

## 2021-11-16 DIAGNOSIS — L02211 Cutaneous abscess of abdominal wall: Secondary | ICD-10-CM | POA: Diagnosis not present

## 2021-11-16 HISTORY — PX: IR RADIOLOGIST EVAL & MGMT: IMG5224

## 2021-11-16 MED ORDER — IOPAMIDOL (ISOVUE-300) INJECTION 61%
100.0000 mL | Freq: Once | INTRAVENOUS | Status: AC | PRN
  Administered 2021-11-16: 100 mL via INTRAVENOUS

## 2021-11-16 NOTE — Progress Notes (Signed)
Chief Complaint: Patient was seen in consultation today for drain management.  Referring Physician(s): Raynelle Bring, MD Nadeen Landau MD  History of Present Illness: Ivan Lopez is a 66 y.o. male with history of prostate cancer and status post prostatectomy, pelvic lymphadenectomy, status post lysis of adhesion and small bowel resection.  Patient developed a postoperative abdominal abscess that was treated with antibiotics and a percutaneous drain on 10/27/2021.  Patient says that he is feeling much better and feels stronger every day.  He denies abdominal pain.  Patient is having no bowel issues.  He has some incontinence associated with the prostatectomy.  He denies fevers or chills.  Patient has been flushing the drain twice a day with saline and output is only 5 to 10 mL.   Past Surgical History:  Procedure Laterality Date   APPENDECTOMY     IR RADIOLOGIST EVAL & MGMT  11/16/2021   LAPAROSCOPIC LYSIS OF ADHESIONS N/A 10/04/2021   Procedure: LAPAROSCOPIC LYSIS OF ADHESIONS;  Surgeon: Erroll Luna, MD;  Location: [facility];  Service: General;  Laterality: N/A;   LAPAROTOMY N/A 10/20/2021   Procedure: EXPLORATORY LAPAROTOMY, LYSIS OF ADHESSIONS, SMALL BOWEL RESECTION, REPAIR OF SMALL BOWEL;  Surgeon: Ileana Roup, MD;  Location: [facility];  Service: General;  Laterality: N/A;   LYMPHADENECTOMY Bilateral 10/04/2021   Procedure: LYMPHADENECTOMY, PELVIC;  Surgeon: Erroll Luna, MD;  Location: [facility];  Service: General;  Laterality: Bilateral;   ROBOT ASSISTED LAPAROSCOPIC RADICAL PROSTATECTOMY N/A 10/04/2021   Procedure: XI ROBOTIC ASSISTED LAPAROSCOPIC RADICAL PROSTATECTOMY LEVEL 3;  Surgeon: Erroll Luna, MD;  Location: [facility];  Service: General;  Laterality: N/A;    Allergies: Patient has no known allergies.  Medications: Prior to Admission medications   Medication Sig Start Date End Date Taking? Authorizing Provider  amoxicillin-clavulanate (AUGMENTIN) 875-125 MG  tablet Take 1 tablet by mouth every 12 (twelve) hours. 08/13/82   Leighton Ruff, MD  atorvastatin (LIPITOR) 80 MG tablet Take 80 mg by mouth at bedtime. 08/19/21   [provider]  docusate sodium (COLACE) 100 MG capsule Take 1 capsule (100 mg total) by mouth 2 (two) times daily. 1/66/06   Leighton Ruff, MD  traMADol (ULTRAM) 50 MG tablet Take 1 tablet (50 mg total) by mouth every 6 (six) hours as needed for moderate pain or severe pain. 05/27/58   Leighton Ruff, MD     No family history on file.  Social History   Socioeconomic History   Marital status: Married    Spouse name: Not on file   Number of children: Not on file   Years of education: Not on file   Highest education level: Not on file  Occupational History   Not on file  Tobacco Use   Smoking status: Never   Smokeless tobacco: Never  Vaping Use   Vaping Use: Never used  Substance and Sexual Activity   Alcohol use: Never   Drug use: Never   Sexual activity: Not on file  Other Topics Concern   Not on file  Social History Narrative   Not on file   Social Determinants of Health   Financial Resource Strain: Not on file  Food Insecurity: Not on file  Transportation Needs: Not on file  Physical Activity: Not on file  Stress: Not on file  Social Connections: Not on file    ECOG Status:  Review of Systems  Constitutional:  Negative for chills and fever.  Gastrointestinal:  Negative for abdominal pain.  Genitourinary:  Occasional incontinence    Vital Signs: There were no vitals taken for this visit.     Physical Exam Constitutional:      Appearance: Normal appearance. He is not ill-appearing.  Pulmonary:     Effort: Pulmonary effort is normal.  Abdominal:     General: Abdomen is flat. There is no distension.     Palpations: Abdomen is soft.     Tenderness: There is no abdominal tenderness.     Comments: Right sided abdominal drain is intact.  No erythema or drainage at the skin site.   Small amount of opaque yellow fluid within the drainage bag.  Dressing was removed from the anterior abdominal wound.  There is a small opening that has packing material in it.        Imaging: CT ABDOMEN PELVIS W CONTRAST  Result Date: 11/16/2021 CLINICAL DATA:  66 year old with history of prostatectomy, laparoscopic lysis of adhesion and small-bowel resection. Patient had a postoperative abscess treated with a percutaneous drain on 10/27/2021. EXAM: CT ABDOMEN AND PELVIS WITH CONTRAST TECHNIQUE: Multidetector CT imaging of the abdomen and pelvis was performed using the standard protocol following bolus administration of intravenous contrast. RADIATION DOSE REDUCTION: This exam was performed according to the departmental dose-optimization program which includes automated exposure control, adjustment of the mA and/or kV according to patient size and/or use of iterative reconstruction technique. CONTRAST:  156m ISOVUE-300 IOPAMIDOL (ISOVUE-300) INJECTION 61% COMPARISON:  CT 11/05/2021 FINDINGS: Lower chest: Lung bases are clear. Hepatobiliary: 2.6 cm low-density structure in the central aspect of the liver is most compatible with cyst. Stable small peripheral low-density in the liver could represent another cyst. Small amount of high-density material in the gallbladder is suggestive for stones. No gallbladder inflammation or dilatation. No biliary dilatation. Pancreas: Unremarkable. No pancreatic ductal dilatation or surrounding inflammatory changes. Spleen: Normal in size without focal abnormality. Adrenals/Urinary Tract: Normal adrenal glands. Normal appearance of both kidneys without hydronephrosis. No suspicious renal lesions. There may be urinary bladder wall thickening but difficult to evaluate due to incomplete distention of the urinary bladder. Stomach/Bowel: Small bowel anastomosis in the left abdomen. Normal appearance of the stomach. Mildly dilated loops of small bowel in the left abdomen  without evidence for an obstruction. No evidence for acute bowel inflammation. Vascular/Lymphatic: Aortic atherosclerosis. No enlarged abdominal or pelvic lymph nodes. Reproductive: Prostatectomy. Other: The small pelvic fluid or abscess collection has resolved. Large anterior abdominal abscess has essentially resolved. There is trace fluid along the caudal aspect of this decompressed collection on sequence 2 image 71. The anterior percutaneous drain remains well positioned within the decompressed collection. No new abdominal fluid collections. Small fluid collection along the left lateral anterior abdomen has also resolved. Again noted postsurgical changes along the anterior abdominal wall with evidence of interval healing. There is no significant fluid along the anterior abdominal wall wound. Musculoskeletal: No acute bone abnormality. IMPRESSION: 1. Anterior abdominal abscess collection has essentially resolved. The collection is decompressed with trace fluid along the caudal aspect. The drain is well positioned within the decompressed collection. In addition, there has been resolution of the small fluid collections in the left anterior abdomen and pelvis. No new fluid or abscess collections in the abdomen or pelvis. 2. Interval healing along the anterior abdominal wound. 3. Postoperative bowel changes. No evidence for bowel obstruction or focal bowel inflammation. 4. Cholelithiasis.  No evidence for acute gallbladder inflammation. Electronically Signed   By: AMarkus DaftM.D.   On: 11/16/2021 14:49   IR Radiologist  Eval & Mgmt  Result Date: 11/16/2021 Please refer to notes tab for details about interventional procedure. (Op Note)  CT ABDOMEN PELVIS W CONTRAST  Result Date: 11/05/2021 CLINICAL DATA:  Abdominal pain, post-op, follow-up drain EXAM: CT ABDOMEN AND PELVIS WITH CONTRAST TECHNIQUE: Multidetector CT imaging of the abdomen and pelvis was performed using the standard protocol following bolus  administration of intravenous contrast. RADIATION DOSE REDUCTION: This exam was performed according to the departmental dose-optimization program which includes automated exposure control, adjustment of the mA and/or kV according to patient size and/or use of iterative reconstruction technique. CONTRAST:  179m OMNIPAQUE IOHEXOL 300 MG/ML  SOLN COMPARISON:  CT 11/01/2021. FINDINGS: Lower chest: Small left pleural effusion, slightly decreased from prior, with adjacent basilar atelectasis. Mild improvement in right basilar atelectasis. Hepatobiliary: No focal liver abnormality is seen. Cholelithiasis. No acute cholecystitis. Pancreas: Unremarkable. No pancreatic ductal dilatation or surrounding inflammatory changes. Spleen: Normal in size without focal abnormality. Adrenals/Urinary Tract: Adrenal glands are unremarkable. No hydronephrosis or nephrolithiasis. The bladder is moderately distended. Stomach/Bowel: Persistent multiple dilated loops of small bowel to the level of the anastomosis. Dilution of previously administered oral contrast. There are air-fluid levels in the colon. Vascular/Lymphatic: Scattered atherosclerosis. No AAA. No lymphadenopathy. Reproductive: Prior prostatectomy. Other: Anterior lower abdominal rim enhancing fluid collection measures up to 11.3 x 1.8 x 16.5 cm (series 2, image 55, series 6, image 57). This previously measured 14.7 x 2.2 x 19.4 cm. Communicating left hemiabdomen rim enhancing collection measures 5.8 x 2.0 cm, previously 8.8 x 4.9 cm (series 2, image 53). Percutaneous pigtail drainage catheter is in place in appropriate position. Rim enhancing collection in the pelvis measures 3.9 x 1.7 x 2.1 cm, previously 4.9 x 1.8 x 1.9 cm (series 2, image 8, series 6 image 58). Continued decrease in loculated fluid under the left hemidiaphragm adjacent to the spleen. Trace residual foci of pneumoperitoneum in the anterior upper abdomen, and adjacent to the stomach and lower mediastinum  adjacent to the distal esophagus/GE junction. Musculoskeletal: No acute osseous abnormality. No suspicious osseous lesion. IMPRESSION: Persistent but decreased size of the anterior lower abdominal abscess and communicating left hemiabdomen abscess, measurements above. Percutaneous drainage catheter is in appropriate position. Slight decrease size of the pelvic abscess. Persistent multiple dilated loops of small bowel, most consistent with ileus. Oral contrast material had passed through the anastomosis on the prior CT. Small left pleural effusion, slightly decreased from prior, with persistent adjacent atelectasis. Improved right basilar atelectasis. Electronically Signed   By: JMaurine SimmeringM.D.   On: 11/05/2021 10:28   CT ABDOMEN PELVIS W CONTRAST  Result Date: 11/01/2021 CLINICAL DATA:  Status post laparoscopic prostatectomy 4 weeks ago and exploratory laparotomy with lysis of adhesions and small-bowel resection 12 days ago, complicated by intra-abdominal abscess requiring IR drain placement five days ago. Continued leukocytosis. EXAM: CT ABDOMEN AND PELVIS WITH CONTRAST TECHNIQUE: Multidetector CT imaging of the abdomen and pelvis was performed using the standard protocol following bolus administration of intravenous contrast. RADIATION DOSE REDUCTION: This exam was performed according to the departmental dose-optimization program which includes automated exposure control, adjustment of the mA and/or kV according to patient size and/or use of iterative reconstruction technique. CONTRAST:  1039mOMNIPAQUE IOHEXOL 300 MG/ML  SOLN COMPARISON:  CT abdomen pelvis dated October 26, 2021. FINDINGS: Lower chest: Essentially resolved right pleural effusion. Unchanged small left pleural effusion. Continued bilateral posterior lower lobe atelectasis. Hepatobiliary: Unchanged simple cyst in the central liver. No new focal liver abnormality. Unchanged tiny gallstones.  No gallbladder wall thickening or biliary dilatation.  Pancreas: Unremarkable. No pancreatic ductal dilatation or surrounding inflammatory changes. Spleen: Normal in size without focal abnormality. Adrenals/Urinary Tract: Adrenal glands are unremarkable. Kidneys are normal, without renal calculi, focal lesion, or hydronephrosis. Bladder is unremarkable. Stomach/Bowel: Multiple dilated loops of small bowel to the level of the anastomosis in the left lower abdomen, similar to prior study. Small bowel distal to the anastomosis is decompressed. Oral contrast reaches the rectum. The stomach is within normal limits. Vascular/Lymphatic: Aortic atherosclerosis. No enlarged abdominal or pelvic lymph nodes. Reproductive: Status post prostatectomy. Other: Interval percutaneous drain placement within the large rim enhancing anterior abdominal gas and fluid collection, which has decreased in size, currently measuring approximally 2.2 x 14.7 cm, previously 8.5 x 17.4 cm. New 8.8 x 4.9 cm rim enhancing fluid collection in the left lateral abdomen (series 2, image 58), which appears to communicate with the ventral abdominal fluid collection (series 2, image 55). Posterior pelvic fluid collection has also slightly decreased in size, currently measuring 1.8 x 4.9 cm (series 2, image 85), previously 3.2 x 5.3 cm. Small amount of loculated fluid under the left hemidiaphragm superior to the spleen appears slightly decreased in size compared to prior study. Decreased now trace pneumoperitoneum in the upper abdomen. Musculoskeletal: No acute or significant osseous findings. IMPRESSION: 1. Interval percutaneous drain placement within the large anterior intra-abdominal abscess, which has decreased in size, currently measuring approximally 2.2 x 14.7 cm. 2. New 8.8 x 4.9 cm abscess in the left lateral abdomen, which appears to communicate with the anterior abscess. 3. Posterior pelvic abscess has also slightly decreased in size, currently measuring 1.8 x 4.9 cm. 4. Multiple dilated loops of  small bowel to the level of the anastomosis in the left lower abdomen, similar to prior study, consistent with ileus. Oral contrast reaches the rectum. 5. Essentially resolved right pleural effusion. Unchanged small left pleural effusion. 6. Aortic Atherosclerosis (ICD10-I70.0). Electronically Signed   By: Titus Dubin M.D.   On: 11/01/2021 14:55   CT IMAGE GUIDED DRAINAGE PERCUT CATH  PERITONEAL RETROPERIT  Result Date: 10/27/2021 INDICATION: 66 year old male referred for abdominal fluid drainage EXAM: CT-GUIDED DRAINAGE OF ABDOMINAL FLUID COLLECTION TECHNIQUE: Multidetector CT imaging of the abdomen was performed following the standard protocol without IV contrast. RADIATION DOSE REDUCTION: This exam was performed according to the departmental dose-optimization program which includes automated exposure control, adjustment of the mA and/or kV according to patient size and/or use of iterative reconstruction technique. MEDICATIONS: The patient is currently admitted to the hospital and receiving intravenous antibiotics. The antibiotics were administered within an appropriate time frame prior to the initiation of the procedure. ANESTHESIA/SEDATION: Moderate (conscious) sedation was employed during this procedure. A total of Versed 1.0 mg and Fentanyl 50 mcg was administered intravenously by the radiology nurse. Total intra-service moderate Sedation Time: 13 minutes. The patient's level of consciousness and vital signs were monitored continuously by radiology nursing throughout the procedure under my direct supervision. COMPLICATIONS: None PROCEDURE: Informed written consent was obtained from the patient after a thorough discussion of the procedural risks, benefits and alternatives. All questions were addressed. Maximal Sterile Barrier Technique was utilized including caps, mask, sterile gowns, sterile gloves, sterile drape, hand hygiene and skin antiseptic. A timeout was performed prior to the initiation of the  procedure. Patient positioned supine position on the CT gantry table. Scout CT was performed for planning purposes. The patient was then prepped and draped in the usual sterile fashion. 1% lidocaine was used for  local anesthesia. Using CT guidance, Yueh needle was advanced into the fluid collection in the anterior abdomen from a right lower abdominal approach. Once we confirmed needle tip position, modified Seldinger technique was used to place a 12 [place] drain. Approximately 520 cc of murky thin fluid aspirated. Sample sent for culture. Gravity bag was attached and the drain was sutured in position. Final image was acquired. Patient tolerated the procedure well and remained hemodynamically stable throughout. No complications were encountered and no significant blood loss. IMPRESSION: Status post CT-guided drainage of anterior abdominal fluid collection. Signed, Dulcy Fanny. Nadene Rubins, RPVI [facility] Radiology Electronically Signed   By: Corrie Mckusick D.O.   On: 10/27/2021 11:38   CT ABDOMEN PELVIS W CONTRAST  Result Date: 10/26/2021 CLINICAL DATA:  Postop abdominal pain status post exploratory laparotomy, lysis of adhesions, and small-bowel resection. EXAM: CT ABDOMEN AND PELVIS WITH CONTRAST TECHNIQUE: Multidetector CT imaging of the abdomen and pelvis was performed using the standard protocol following bolus administration of intravenous contrast. RADIATION DOSE REDUCTION: This exam was performed according to the departmental dose-optimization program which includes automated exposure control, adjustment of the mA and/or kV according to patient size and/or use of iterative reconstruction technique. CONTRAST:  151m OMNIPAQUE IOHEXOL 300 MG/ML  SOLN COMPARISON:  10/17/2021 FINDINGS: Lower chest: There are small bilateral pleural effusions, left greater than right. Bilateral lower lobe atelectasis. Hepatobiliary: No suspicious liver lesion. Cyst within  central right lobe of liver measures 2.7 cm, image 22/2. Tiny stones identified within the dependent portion of the gallbladder, image 31/2. Pancreas: Unremarkable. No pancreatic ductal dilatation or surrounding inflammatory changes. Spleen: Normal in size without focal abnormality. Adrenals/Urinary Tract: Normal adrenal glands. No kidney mass or hydronephrosis identified. Partially decompressed urinary bladder is grossly unremarkable. Stomach/Bowel: There is a nasogastric tube with tip terminating in the gastric fundus. Signs of recent small bowel resection identified with anastomotic suture chain identified in the left lower quadrant of the abdomen, image 60/2. The proximal small bowel loops appear increased in caliber measuring up to 3.9 cm. Transition to decreased caliber small bowel noted within the left lower quadrant of the abdomen, image 37/7 and image 70/2. Distal small bowel loops appear decompressed. No pathologic dilatation of the colon. Vascular/Lymphatic: Aortic atherosclerosis without aneurysm. No signs of abdominopelvic adenopathy. Reproductive: Prostate gland is not visualized and may be surgically absent. Other: There is a large peripherally enhancing fluid collection within the ventral abdomen exhibiting mass effect and displacing the bowel loops posteriorly and laterally. This measures 17.4 x 8.5 by 23.1 cm (volume = 1800 cm^3), image 64/8 and image 55/2. Scattered locules of gas are identified within this fluid collection. Smaller fluid collection is noted within the posterior pelvis measuring 5.3 by 3.2 by 2.8 cm (volume = 25 cm^3), image 88/2. Moderate pneumoperitoneum is identified. Musculoskeletal: No acute or significant osseous findings. IMPRESSION: 1. Signs of recent small bowel resection with anastomotic suture chain in the left lower quadrant of the abdomen. There is abnormal dilatation of the proximal and mid small bowel loops with transition to decreased caliber distal small bowel.  Differential excludes recurrent small bowel obstruction versus postoperative ileus. 2. Large peripherally enhancing fluid collection within the ventral abdomen exhibiting mass effect and displacing the bowel loops posteriorly and laterally. Cannot exclude underlying abscess. 3. Smaller fluid collection is noted within the posterior pelvis measuring up to 5.3 cm. 4. Moderate pneumoperitoneum. 5. Small bilateral pleural effusions, left greater than right. 6. Cholelithiasis. 7. Aortic Atherosclerosis (  ICD10-I70.0). Electronically Signed   By: Kerby Moors M.D.   On: 10/26/2021 14:27   DG Abd Portable 1V  Result Date: 10/25/2021 CLINICAL DATA:  A 66 year old male presents for evaluation of postoperative ileus. EXAM: PORTABLE ABDOMEN - 1 VIEW COMPARISON:  October 22, 2021 FINDINGS: Mildly distended loops of bowel in the upper abdomen. Gas and stool in the RIGHT colon and transverse colon. The opacity of gas over the LEFT hemiabdomen with respect to colon and rectum. Gaseous distension of bowel loops in the upper and LEFT abdomen is diminished. Gastric tube remains in place tip in the fundus, side port below GE junction. Basilar airspace disease and midline skin staples similar to prior imaging. No acute regional skeletal process. IMPRESSION: 1. Findings of improving ileus. No high-grade obstruction or free air. 2. Gastric tube remains in place. 3. Bibasilar airspace disease, attention on follow-up, potentially atelectasis. Electronically Signed   By: Zetta Bills M.D.   On: 10/25/2021 09:55   DG CHEST PORT 1 VIEW  Result Date: 10/22/2021 CLINICAL DATA:  Nasogastric tube placement. EXAM: PORTABLE CHEST 1 VIEW COMPARISON:  July 20, 2016. FINDINGS: The heart size and mediastinal contours are within normal limits. Nasogastric tube tip is seen in proximal stomach. Hypoinflation of the lungs is noted with mild bibasilar subsegmental atelectasis. Right-sided PICC line is noted with tip in expected position of the SVC.  The visualized skeletal structures are unremarkable. IMPRESSION: Hypoinflation of the lungs with mild bibasilar subsegmental atelectasis. Nasogastric tube tip seen in proximal stomach. Electronically Signed   By: Marijo Conception M.D.   On: 10/22/2021 12:08   DG Abd Portable 1V  Result Date: 10/22/2021 CLINICAL DATA:  Ileus and NG tube placement EXAM: PORTABLE ABDOMEN - 1 VIEW COMPARISON:  KUB 10/15/2018 FINDINGS: The enteric catheter tip and side hole are in the stomach. There is gaseous distention of the stomach and loops of bowel in the left hemiabdomen. There is no definite free intraperitoneal air. There is no acute osseous abnormality. IMPRESSION: 1. Enteric catheter tip and side hole in the stomach. 2. Gaseous distention of loops of bowel in the left hemiabdomen may reflect ileus or persistent obstruction. Electronically Signed   By: Valetta Mole M.D.   On: 10/22/2021 12:08    Labs:  CBC: Recent Labs    11/03/21 0410 11/04/21 0645 11/05/21 0406 11/06/21 0323  WBC 19.2* 15.6* 14.4* 12.8*  HGB 8.1* 8.8* 8.6* 8.9*  HCT 25.8* 27.9* 28.0* 28.3*  PLT 740* 847* 798* 843*    COAGS: Recent Labs    10/27/21 0417  INR 1.1    BMP: Recent Labs    10/30/21 0250 11/01/21 0318 11/04/21 0645 11/06/21 0323  NA 133* 134* 136 135  K 4.0 4.0 4.0 3.7  CL 100 103 105 104  CO2 26 21* 24 22  GLUCOSE 132* 130* 112* 116*  BUN 24* 21 21 25*  CALCIUM 7.9* 8.2* 8.5* 8.5*  CREATININE 0.88 0.99 1.10 1.24  GFRNONAA >60 >60 >60 >60    LIVER FUNCTION TESTS: Recent Labs    10/25/21 0335 10/28/21 0445 11/01/21 0318 11/04/21 0645  BILITOT 0.5 0.3 0.6 0.2*  AST 99* 20 39 35  ALT 153* 81* 74* 82*  ALKPHOS 237* 182* 241* 248*  PROT 5.5* 5.7* 6.8 7.3  ALBUMIN <1.5* <1.5* 1.7* 1.7*    TUMOR MARKERS: No results for input(s): "AFPTM", "CEA", "CA199", "CHROMGRNA" in the last 8760 hours.  Assessment and Plan:  66 year old with history of postoperative abdominal fluid collections/abscesses.  Follow-up CT demonstrates that the large abscess has essentially resolved.  There is trace residual fluid along the caudal aspect of the decompressed collection.  In addition, the smaller collections in the pelvis and left lateral abdomen have resolved.  Patient is doing very well from a clinical standpoint.  However, the fluid in the drainage bag is an opaque yellow and looks purulent.  As result, a drain injection was performed.  There is no evidence for a bowel fistula.  The fluid was more clear and pink-colored when I aspirated and flushed it myself.  Explained to the patient that the abscess collections have essentially resolved but I am concerned about the appearance of the residual fluid coming out of the drain.  It is possible that the output has a opaque yellow appearance due to residual material within the bag tubing.  Therefore, I gave the patient a new bag and instructed him to continue flushing for the next couple days.  If he continues to have minimal output and the output is clear or slightly pink, then I think it would be appropriate to remove the drain.  Otherwise, the patient is scheduled to follow-up with Dr. Dema Severin in general surgery next week.  If the drain is not removed by general surgery next week, we will tentatively schedule the patient in approximately 2 weeks for re-evaluation of the drain.  Patient does not need additional imaging at this time.    Electronically Signed: Burman Riis 11/16/2021, 2:52 PM   I spent a total of   15 Minutes in face to face in clinical consultation, greater than 50% of which was counseling/coordinating care for drain management.  Patient ID: Daouda Lonzo, male   DOB: 05-May-1955, 66 y.o.   MRN: [id number]

## 2021-11-26 DIAGNOSIS — K913 Postprocedural intestinal obstruction, unspecified as to partial versus complete: Secondary | ICD-10-CM | POA: Diagnosis not present

## 2021-11-26 DIAGNOSIS — K660 Peritoneal adhesions (postprocedural) (postinfection): Secondary | ICD-10-CM | POA: Diagnosis not present

## 2021-11-30 ENCOUNTER — Other Ambulatory Visit: Payer: Medicare Other

## 2022-02-28 DIAGNOSIS — I10 Essential (primary) hypertension: Secondary | ICD-10-CM | POA: Diagnosis not present

## 2022-02-28 DIAGNOSIS — E785 Hyperlipidemia, unspecified: Secondary | ICD-10-CM | POA: Diagnosis not present

## 2022-03-03 DIAGNOSIS — R300 Dysuria: Secondary | ICD-10-CM | POA: Diagnosis not present

## 2022-04-07 DIAGNOSIS — H2513 Age-related nuclear cataract, bilateral: Secondary | ICD-10-CM | POA: Diagnosis not present

## 2022-07-11 DIAGNOSIS — G8929 Other chronic pain: Secondary | ICD-10-CM | POA: Diagnosis not present

## 2022-07-11 DIAGNOSIS — M545 Low back pain, unspecified: Secondary | ICD-10-CM | POA: Diagnosis not present

## 2022-08-30 DIAGNOSIS — G8929 Other chronic pain: Secondary | ICD-10-CM | POA: Diagnosis not present

## 2022-08-30 DIAGNOSIS — I10 Essential (primary) hypertension: Secondary | ICD-10-CM | POA: Diagnosis not present

## 2022-08-30 DIAGNOSIS — E785 Hyperlipidemia, unspecified: Secondary | ICD-10-CM | POA: Diagnosis not present

## 2022-08-30 DIAGNOSIS — M545 Low back pain, unspecified: Secondary | ICD-10-CM | POA: Diagnosis not present

## 2023-03-01 DIAGNOSIS — I10 Essential (primary) hypertension: Secondary | ICD-10-CM | POA: Diagnosis not present

## 2023-03-01 DIAGNOSIS — E785 Hyperlipidemia, unspecified: Secondary | ICD-10-CM | POA: Diagnosis not present

## 2023-03-01 DIAGNOSIS — G8929 Other chronic pain: Secondary | ICD-10-CM | POA: Diagnosis not present

## 2023-03-01 DIAGNOSIS — M545 Low back pain, unspecified: Secondary | ICD-10-CM | POA: Diagnosis not present

## 2023-04-18 DIAGNOSIS — H2513 Age-related nuclear cataract, bilateral: Secondary | ICD-10-CM | POA: Diagnosis not present

## 2023-08-28 DIAGNOSIS — M545 Low back pain, unspecified: Secondary | ICD-10-CM | POA: Diagnosis not present

## 2023-08-28 DIAGNOSIS — E785 Hyperlipidemia, unspecified: Secondary | ICD-10-CM | POA: Diagnosis not present

## 2023-08-28 DIAGNOSIS — G8929 Other chronic pain: Secondary | ICD-10-CM | POA: Diagnosis not present

## 2023-08-28 DIAGNOSIS — I10 Essential (primary) hypertension: Secondary | ICD-10-CM | POA: Diagnosis not present
